# Patient Record
Sex: Male | Born: 1980 | Race: Black or African American | Hispanic: No | Marital: Married | Smoking: Current every day smoker
Health system: Southern US, Community
[De-identification: ages and names within clinical notes are randomized; demographics above are authoritative.]

## PROBLEM LIST (undated history)

## (undated) DIAGNOSIS — Z5900 Homelessness unspecified: Secondary | ICD-10-CM

## (undated) DIAGNOSIS — F1010 Alcohol abuse, uncomplicated: Secondary | ICD-10-CM

---
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
## 2002-10-30 ENCOUNTER — Emergency Department (HOSPITAL_COMMUNITY): Admission: EM | Admit: 2002-10-30 | Discharge: 2002-10-30 | Payer: Self-pay | Admitting: *Deleted

## 2002-11-03 ENCOUNTER — Emergency Department (HOSPITAL_COMMUNITY): Admission: EM | Admit: 2002-11-03 | Discharge: 2002-11-03 | Payer: Self-pay | Admitting: Emergency Medicine

## 2002-11-04 ENCOUNTER — Emergency Department (HOSPITAL_COMMUNITY): Admission: EM | Admit: 2002-11-04 | Discharge: 2002-11-05 | Payer: Self-pay | Admitting: Emergency Medicine

## 2005-05-02 ENCOUNTER — Emergency Department (HOSPITAL_COMMUNITY): Admission: EM | Admit: 2005-05-02 | Discharge: 2005-05-02 | Payer: Self-pay | Admitting: Emergency Medicine

## 2005-09-19 ENCOUNTER — Emergency Department (HOSPITAL_COMMUNITY): Admission: EM | Admit: 2005-09-19 | Discharge: 2005-09-19 | Payer: Self-pay | Admitting: Emergency Medicine

## 2008-10-06 ENCOUNTER — Emergency Department (HOSPITAL_COMMUNITY): Admission: EM | Admit: 2008-10-06 | Discharge: 2008-10-06 | Payer: Self-pay | Admitting: Emergency Medicine

## 2008-11-06 ENCOUNTER — Emergency Department (HOSPITAL_COMMUNITY): Admission: EM | Admit: 2008-11-06 | Discharge: 2008-11-06 | Payer: Self-pay | Admitting: Emergency Medicine

## 2008-11-08 ENCOUNTER — Emergency Department (HOSPITAL_COMMUNITY): Admission: EM | Admit: 2008-11-08 | Discharge: 2008-11-08 | Payer: Self-pay | Admitting: Emergency Medicine

## 2010-08-08 ENCOUNTER — Emergency Department (HOSPITAL_COMMUNITY): Payer: Self-pay

## 2010-08-08 ENCOUNTER — Emergency Department (HOSPITAL_COMMUNITY)
Admission: EM | Admit: 2010-08-08 | Discharge: 2010-08-08 | Disposition: A | Discharge status: Home / Self Care | Payer: Self-pay | Attending: Emergency Medicine | Admitting: Emergency Medicine

## 2010-08-08 DIAGNOSIS — X58XXXA Exposure to other specified factors, initial encounter: Secondary | ICD-10-CM | POA: Insufficient documentation

## 2010-08-08 DIAGNOSIS — M25519 Pain in unspecified shoulder: Secondary | ICD-10-CM | POA: Insufficient documentation

## 2010-08-08 DIAGNOSIS — Y9367 Activity, basketball: Secondary | ICD-10-CM | POA: Insufficient documentation

## 2010-11-24 ENCOUNTER — Emergency Department (HOSPITAL_COMMUNITY): Payer: Self-pay

## 2010-11-24 ENCOUNTER — Emergency Department (HOSPITAL_COMMUNITY)
Admission: EM | Admit: 2010-11-24 | Discharge: 2010-11-24 | Disposition: A | Discharge status: Home / Self Care | Payer: Self-pay | Attending: Emergency Medicine | Admitting: Emergency Medicine

## 2010-11-24 DIAGNOSIS — W1809XA Striking against other object with subsequent fall, initial encounter: Secondary | ICD-10-CM | POA: Insufficient documentation

## 2010-11-24 DIAGNOSIS — S4350XA Sprain of unspecified acromioclavicular joint, initial encounter: Secondary | ICD-10-CM | POA: Insufficient documentation

## 2010-11-24 DIAGNOSIS — M25519 Pain in unspecified shoulder: Secondary | ICD-10-CM | POA: Insufficient documentation

## 2011-11-10 ENCOUNTER — Emergency Department (HOSPITAL_COMMUNITY): Payer: Self-pay

## 2011-11-10 ENCOUNTER — Emergency Department (HOSPITAL_COMMUNITY)
Admission: EM | Admit: 2011-11-10 | Discharge: 2011-11-10 | Disposition: A | Discharge status: Home / Self Care | Payer: Self-pay | Attending: Emergency Medicine | Admitting: Emergency Medicine

## 2011-11-10 ENCOUNTER — Encounter (HOSPITAL_COMMUNITY): Payer: Self-pay | Admitting: Adult Health

## 2011-11-10 DIAGNOSIS — S40012A Contusion of left shoulder, initial encounter: Secondary | ICD-10-CM

## 2011-11-10 DIAGNOSIS — S40019A Contusion of unspecified shoulder, initial encounter: Secondary | ICD-10-CM | POA: Insufficient documentation

## 2011-11-10 DIAGNOSIS — F17200 Nicotine dependence, unspecified, uncomplicated: Secondary | ICD-10-CM | POA: Insufficient documentation

## 2011-11-10 MED ORDER — KETOROLAC TROMETHAMINE 60 MG/2ML IM SOLN
60.0000 mg | Freq: Once | INTRAMUSCULAR | Status: AC
  Administered 2011-11-10: 60 mg via INTRAMUSCULAR
  Filled 2011-11-10: qty 2

## 2011-11-10 MED ORDER — TRAMADOL HCL 50 MG PO TABS: 50.0000 mg | ORAL_TABLET | Freq: Four times a day (QID) | ORAL | Status: AC | PRN
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
## 2011-11-10 NOTE — ED Notes (Signed)
Alert, NAD, calm, interactive, EDP in to see pt.

## 2012-07-18 ENCOUNTER — Emergency Department (HOSPITAL_COMMUNITY): Payer: Self-pay

## 2012-07-18 ENCOUNTER — Emergency Department (HOSPITAL_COMMUNITY)
Admission: EM | Admit: 2012-07-18 | Discharge: 2012-07-19 | Disposition: A | Discharge status: Home / Self Care | Payer: Self-pay | Attending: Emergency Medicine | Admitting: Emergency Medicine

## 2012-07-18 ENCOUNTER — Encounter (HOSPITAL_COMMUNITY): Payer: Self-pay | Admitting: Radiology

## 2012-07-18 DIAGNOSIS — F17200 Nicotine dependence, unspecified, uncomplicated: Secondary | ICD-10-CM | POA: Insufficient documentation

## 2012-07-18 DIAGNOSIS — F10929 Alcohol use, unspecified with intoxication, unspecified: Secondary | ICD-10-CM

## 2012-07-18 DIAGNOSIS — F1010 Alcohol abuse, uncomplicated: Secondary | ICD-10-CM | POA: Insufficient documentation

## 2012-07-18 LAB — CBC WITH DIFFERENTIAL/PLATELET
Basophils Absolute: 0 10*3/uL (ref 0.0–0.1)
Basophils Relative: 1 % (ref 0–1)
Eosinophils Absolute: 0 10*3/uL (ref 0.0–0.7)
Eosinophils Relative: 0 % (ref 0–5)
HCT: 41.7 % (ref 39.0–52.0)
Hemoglobin: 14.5 g/dL (ref 13.0–17.0)
Lymphocytes Relative: 41 % (ref 12–46)
Lymphs Abs: 1.5 10*3/uL (ref 0.7–4.0)
MCH: 31.9 pg (ref 26.0–34.0)
MCHC: 34.8 g/dL (ref 30.0–36.0)
MCV: 91.9 fL (ref 78.0–100.0)
Monocytes Absolute: 0.2 10*3/uL (ref 0.1–1.0)
Monocytes Relative: 6 % (ref 3–12)
Neutro Abs: 1.9 10*3/uL (ref 1.7–7.7)
Neutrophils Relative %: 52 % (ref 43–77)
Platelets: 240 10*3/uL (ref 150–400)
RBC: 4.54 MIL/uL (ref 4.22–5.81)
RDW: 14.2 % (ref 11.5–15.5)
WBC: 3.6 10*3/uL — ABNORMAL LOW (ref 4.0–10.5)

## 2012-07-18 LAB — BASIC METABOLIC PANEL
BUN: 9 mg/dL (ref 6–23)
CO2: 25 mEq/L (ref 19–32)
Calcium: 8.6 mg/dL (ref 8.4–10.5)
Chloride: 106 mEq/L (ref 96–112)
Creatinine, Ser: 0.93 mg/dL (ref 0.50–1.35)
GFR calc Af Amer: >90 mL/min (ref >90)
GFR calc non Af Amer: >90 mL/min (ref >90)
Glucose, Bld: 85 mg/dL (ref 70–99)
Potassium: 4.6 mEq/L (ref 3.5–5.1)
Sodium: 143 mEq/L (ref 135–145)

## 2012-07-18 LAB — RAPID URINE DRUG SCREEN, HOSP PERFORMED
Amphetamines: NOT DETECTED
Barbiturates: NOT DETECTED
Benzodiazepines: NOT DETECTED
Cocaine: NOT DETECTED
Opiates: NOT DETECTED
Tetrahydrocannabinol: NOT DETECTED

## 2012-07-18 LAB — ETHANOL: Alcohol, Ethyl (B): 404 mg/dL (ref 0–11)

## 2012-07-18 MED ORDER — SODIUM CHLORIDE 0.9 % IV BOLUS (SEPSIS)
1000.0000 mL | Freq: Once | INTRAVENOUS | Status: AC
  Administered 2012-07-18: 1000 mL via INTRAVENOUS
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
## 2012-07-18 NOTE — ED Notes (Signed)
Pt continues to rest without distress.

## 2012-07-18 NOTE — ED Notes (Signed)
Back from CT, no changes, alert, NAD, calm, interactive.  
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
## 2012-07-18 NOTE — ED Notes (Signed)
Patient transported to CT 
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
## 2012-07-19 ENCOUNTER — Encounter (HOSPITAL_COMMUNITY): Payer: Self-pay | Admitting: Emergency Medicine

## 2012-07-19 MED ORDER — SODIUM CHLORIDE 0.9 % IV BOLUS (SEPSIS)
1000.0000 mL | Freq: Once | INTRAVENOUS | Status: AC
  Administered 2012-07-19: 1000 mL via INTRAVENOUS

## 2012-07-19 NOTE — ED Notes (Signed)
Arousable, alert, NAD, calm, IVF bolus infusing, VSS.
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
## 2012-07-19 NOTE — ED Notes (Signed)
Pt sleeping, NAD, calm, arousable, interactive.

## 2012-07-19 NOTE — ED Notes (Signed)
arousable, interactive, alert, NAD, calm, speaking in clear sentences, mentions generalized pain "all over" and nausea, denies other sx. VSS.

## 2012-07-19 NOTE — ED Notes (Addendum)
VSS, BP low, sleeping, arousable, NAD, calm, interactive, 3rd liter NS IVF hung KVO.

## 2012-07-28 ENCOUNTER — Emergency Department (HOSPITAL_COMMUNITY): Payer: Self-pay

## 2012-07-28 ENCOUNTER — Emergency Department (HOSPITAL_COMMUNITY)
Admission: EM | Admit: 2012-07-28 | Discharge: 2012-07-28 | Disposition: A | Discharge status: Home / Self Care | Payer: Self-pay | Attending: Emergency Medicine | Admitting: Emergency Medicine

## 2012-07-28 ENCOUNTER — Encounter (HOSPITAL_COMMUNITY): Payer: Self-pay | Admitting: Emergency Medicine

## 2012-07-28 DIAGNOSIS — S01501A Unspecified open wound of lip, initial encounter: Secondary | ICD-10-CM | POA: Insufficient documentation

## 2012-07-28 DIAGNOSIS — IMO0002 Reserved for concepts with insufficient information to code with codable children: Secondary | ICD-10-CM | POA: Insufficient documentation

## 2012-07-28 DIAGNOSIS — T7411XA Adult physical abuse, confirmed, initial encounter: Secondary | ICD-10-CM | POA: Insufficient documentation

## 2012-07-28 DIAGNOSIS — S0990XA Unspecified injury of head, initial encounter: Secondary | ICD-10-CM | POA: Insufficient documentation

## 2012-07-28 DIAGNOSIS — S01511A Laceration without foreign body of lip, initial encounter: Secondary | ICD-10-CM

## 2012-07-28 DIAGNOSIS — S0993XA Unspecified injury of face, initial encounter: Secondary | ICD-10-CM | POA: Insufficient documentation

## 2012-07-28 DIAGNOSIS — F17200 Nicotine dependence, unspecified, uncomplicated: Secondary | ICD-10-CM | POA: Insufficient documentation

## 2012-07-28 DIAGNOSIS — S199XXA Unspecified injury of neck, initial encounter: Secondary | ICD-10-CM | POA: Insufficient documentation

## 2012-07-28 DIAGNOSIS — Z23 Encounter for immunization: Secondary | ICD-10-CM | POA: Insufficient documentation

## 2012-07-28 MED ORDER — TETANUS-DIPHTH-ACELL PERTUSSIS 5-2.5-18.5 LF-MCG/0.5 IM SUSP
0.5000 mL | Freq: Once | INTRAMUSCULAR | Status: AC
  Administered 2012-07-28: 0.5 mL via INTRAMUSCULAR
  Filled 2012-07-28: qty 0.5

## 2012-07-28 MED ORDER — TRAMADOL HCL 50 MG PO TABS
50.0000 mg | ORAL_TABLET | Freq: Once | ORAL | Status: AC
  Administered 2012-07-28: 50 mg via ORAL
  Filled 2012-07-28: qty 1

## 2012-07-28 MED ORDER — ONDANSETRON 4 MG PO TBDP
8.0000 mg | ORAL_TABLET | Freq: Once | ORAL | Status: AC
  Administered 2012-07-28: 8 mg via ORAL
  Filled 2012-07-28: qty 2

## 2012-07-28 MED ORDER — OXYCODONE-ACETAMINOPHEN 5-325 MG PO TABS
2.0000 | ORAL_TABLET | Freq: Once | ORAL | Status: AC
  Administered 2012-07-28: 2 via ORAL
  Filled 2012-07-28: qty 2

## 2012-07-28 MED ORDER — PENICILLIN V POTASSIUM 500 MG PO TABS: 500.0000 mg | ORAL_TABLET | Freq: Four times a day (QID) | ORAL | Status: AC

## 2012-07-28 MED ORDER — HYDROCODONE-ACETAMINOPHEN 5-325 MG PO TABS: 2.0000 | ORAL_TABLET | Freq: Once | ORAL | Status: DC

## 2012-07-28 MED ORDER — HYDROCODONE-ACETAMINOPHEN 5-325 MG PO TABS: 2.0000 | ORAL_TABLET | ORAL | Status: DC | PRN

## 2012-07-28 MED ORDER — ONDANSETRON 4 MG PO TBDP: 8.0000 mg | ORAL_TABLET | Freq: Once | ORAL | Status: DC
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
## 2012-07-28 NOTE — ED Notes (Signed)
Dahlia Client, PA at bedside suturing laceration on the inside of lower lip.

## 2012-07-28 NOTE — ED Notes (Signed)
Returned from xray

## 2012-07-28 NOTE — ED Notes (Signed)
Patient transported to X-ray 

## 2012-07-28 NOTE — ED Notes (Signed)
Jake Silva, [place] Updated that pt received doses of meds placed at 2026

## 2012-07-30 NOTE — ED Provider Notes (Signed)
  Medical screening examination/treatment/procedure(s) were performed by non-physician practitioner and as supervising physician I was immediately available for consultation/collaboration.    Gerhard Munch, MD 07/30/12 [phone]

## 2013-07-21 ENCOUNTER — Emergency Department (HOSPITAL_COMMUNITY): Payer: Self-pay

## 2013-07-21 ENCOUNTER — Emergency Department (HOSPITAL_COMMUNITY)
Admission: EM | Admit: 2013-07-21 | Discharge: 2013-07-22 | Disposition: A | Discharge status: Home / Self Care | Payer: Self-pay | Attending: Emergency Medicine | Admitting: Emergency Medicine

## 2013-07-21 DIAGNOSIS — R55 Syncope and collapse: Secondary | ICD-10-CM | POA: Insufficient documentation

## 2013-07-21 DIAGNOSIS — F10929 Alcohol use, unspecified with intoxication, unspecified: Secondary | ICD-10-CM

## 2013-07-21 DIAGNOSIS — F17200 Nicotine dependence, unspecified, uncomplicated: Secondary | ICD-10-CM | POA: Insufficient documentation

## 2013-07-21 DIAGNOSIS — F1010 Alcohol abuse, uncomplicated: Secondary | ICD-10-CM | POA: Insufficient documentation

## 2013-07-21 LAB — CBG MONITORING, ED: GLUCOSE-CAPILLARY: 84 mg/dL (ref 70–99)

## 2013-07-21 LAB — ETHANOL: ALCOHOL ETHYL (B): 386 mg/dL — AB (ref 0–11)

## 2013-07-21 NOTE — ED Notes (Signed)
Pt cleared off spine board by Dr Gwendolyn GrantWalden
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
## 2013-07-21 NOTE — ED Notes (Signed)
Bed: [id number] Expected date:  Expected time:  Means of arrival:  Comments: EMS 38M ETOH/Syncope

## 2013-07-22 NOTE — ED Notes (Signed)
Comfort measures given,

## 2013-07-22 NOTE — ED Notes (Signed)
Pt is sleeping,  NAD,  Vital signs within normal limit ,  Monitor on pt

## 2013-07-22 NOTE — ED Notes (Signed)
Pt had two sandwiches and two ginger ales,  Pt tolerated well

## 2013-07-22 NOTE — ED Notes (Signed)
Patient is alert and oriented x3.  He was given DC instructions and follow up visit instructions.  Patient gave verbal understanding.  He was DC ambulatory under his own power to home.  V/S stable.  He was not showing any signs of distress on DC 

## 2013-07-22 NOTE — ED Notes (Signed)
c-collar removed

## 2013-07-22 NOTE — Discharge Instructions (Signed)
°Emergency Department Resource Guide °1) Find a Doctor and Pay Out of Pocket °Although you won't have to find out who is covered by your insurance plan, it is a good idea to ask around and get recommendations. You will then need to call the office and see if the doctor you have chosen will accept you as a new patient and what types of options they offer for patients who are self-pay. Some doctors offer discounts or will set up payment plans for their patients who do not have insurance, but you will need to ask so you aren't surprised when you get to your appointment. ° °2) Contact Your Local Health Department °Not all health departments have doctors that can see patients for sick visits, but many do, so it is worth a call to see if yours does. If you don't know where your local health department is, you can check in your phone book. The CDC also has a tool to help you locate your state's health department, and many state websites also have listings of all of their local health departments. ° °3) Find a Walk-in Clinic °If your illness is not likely to be very severe or complicated, you may want to try a walk in clinic. These are popping up all over the country in pharmacies, drugstores, and shopping centers. They're usually staffed by nurse practitioners or physician assistants that have been trained to treat common illnesses and complaints. They're usually fairly quick and inexpensive. However, if you have serious medical issues or chronic medical problems, these are probably not your best option. ° °No Primary Care Doctor: °- Call [facility] at  832-8000 - they can help you locate a primary care doctor that  accepts your insurance, provides certain services, etc. °- Physician Referral Service- 1-800-533-3463 ° °Chronic Pain Problems: °Organization         Address  Phone   Notes  °[facility]  (336) [phone] Patients need to be referred by their primary care doctor.  ° °Medication  Assistance: °Organization         Address  Phone   Notes  °[place] [facility] [address] °[place], [place] [address] (336) 641-8030 --Must be a resident of [place] °-- Must have NO insurance coverage whatsoever (no Medicaid/ Medicare, etc.) °-- The pt. MUST have a primary care doctor that directs their care regularly and follows them in the community °  °[facility]  (866) 331-1348   °[employer]  (888) 892-1162   ° °Agencies that provide inexpensive medical care: °Organization         Address  Phone   Notes  °[facility]  (336) 832-8035   °[facility]    (336) 832-7272   °[facility] [address] °[place], [place] [address] (336) 832-4777   °[facility] [address], °[place] (336) 271-4999   °[facility]    (336) 373-0678   °[facility]    (336) 272-1050   °[facility] ° [address], [place] Phone:  (336) 832-4444, Fax:  (336) 832-4440 Hours of Operation:  9 am - 6 pm, M-F.  Also accepts Medicaid/Medicare and self-pay.  °[facility] ° [address], [place] Phone: (336) [phone], Fax: (336) [phone]. Hours of Operation:  8:30 am - 5:30 pm, M-F.  Also accepts Medicaid and self-pay.  °[facility] [place] [address], [place] Phone: (336) 878-6027   °[facility] [address], [place], [place] (336)723-1848, Ext. 123 Mondays & Thursdays: 7-9 AM.  First 15 patients are seen on a first come, first serve basis. °  ° °Medicaid-accepting [place] Providers: ° °Organization         Address  Phone   Notes  °[facility] [address], [place] (336) 641-2100 Also accepts self-pay patients.  °[facility] [address], [place] ° (336) 856-9996   °[facility] [address], [place]  (336) 288-8857   °[facility] [address] [place] [address], [place] (336) 299-7000   °Veita Bland [address], [place]  ° (336) 373-1557 Only accepts [place] Access Medicaid patients after they have their name applied to their card.  ° °Self-Pay (no insurance) in [place]: ° °Organization         Address  Phone   Notes  °Sickle Cell Patients, [facility] [address], [place] (336) 832-1970   °[facility] [address], [place] (336) 832-4400   °[facility]  ° [address],  (336) 992-4800   °[facility]/Dr. Osei-Bonsu ° [address] [place] [address], [place] or [address], [place] (336) 841-8500 Phone number for both [place] and [place] locations is the same.  °[facility] [address], [place] (336) 299-0000   °[facility] [address] [place] [address], [place] or [address] (336) 852-7530 °(336) 878-2260   °[facility] [address], [place] (336) 350-1642, phone; (336) 294-5005, fax Sees patients 1st and 3rd Saturday of every month.  Must not qualify for public or private insurance (i.e. Medicaid, Medicare, [place] Health Choice, Veterans' Benefits) • Household income should be no more than 200% of the poverty level •The clinic cannot treat you if you are pregnant or think you are pregnant • Sexually transmitted diseases are not treated at the clinic.  ° ° °Dental Care: °Organization         Address  Phone  Notes  °[place] [facility] [address], [place] (336) 641-6152 Accepts children up to age 21 who are enrolled in Medicaid or [place] Health Choice; pregnant women with a Medicaid card; and children who have applied for Medicaid or [place] Health Choice, but were declined, whose parents can pay a reduced fee at time of service.  °[place]  [facility] [place]  [address], [place] (336) 641-7733 Accepts children up to age 21 who are enrolled in Medicaid or [place] Health Choice; pregnant women with a Medicaid card; and children who have applied for Medicaid or [place] Health Choice, but were declined, whose parents can pay a reduced fee at time of service.  °[facility] ° [address], [place] (336) 641-4533 Patients are seen by appointment only. Walk-ins are not accepted. [facility] will see patients 18 years of age and older. °Monday - Tuesday (8am-5pm) °Most Wednesdays (8:30-5pm) °$30 per visit, cash only  °[facility] ° [address], [place] (336) 641-4533 Patients are seen by appointment only. Walk-ins are not accepted. [facility] will see patients 18 years of age and older. °One   Wednesday Evening (Monthly: Volunteer Based).  $30 per visit, cash only  °[facility]  (919) 537-3737 for adults; Children under age 4, call Graduate Pediatric Dentistry at ([phone]) [phone]. Children aged 4-14, please call ([phone]) [phone] to request a pediatric application. ° Dental services are provided in all areas of dental care including fillings, crowns and bridges, complete and partial dentures, implants, gum treatment, root canals, and extractions. Preventive care is also provided. Treatment is provided to both adults and children. °Patients are selected via a lottery and there is often a waiting list. °  °[facility] [address], °[place] ° (336) 763-8833 [email or web address] °  °[facility] [address], [place], [place] (336)723-1848, Ext. 123 Second and Fourth Thursday of each month, opens at 6:30 AM; Clinic ends at 9 AM.  Patients are seen on a first-come first-served basis, and a limited number are seen during each clinic.  ° °[facility] ° [address], [place], [place] (336) 723-7904    Eligibility Requirements °You must have lived in [place], or [place] counties for at least the last three months. °  You cannot be eligible for state or federal sponsored healthcare insurance, including Veterans Administration, Medicaid, or Medicare. °  You generally cannot be eligible for healthcare insurance through your employer.  °  How to apply: °Eligibility screenings are held every Tuesday and Wednesday afternoon from 1:00 pm until 4:00 pm. You do not need an appointment for the interview!  °[facility] [address], [place], [place] 336-631-2330   °[facility]  336-342-8273   °[facility]  336-703-3100   °[facility]  336-570-6415   ° °Behavioral Health Resources in the Community: °Intensive Outpatient Programs °Organization         Address  Phone  Notes  °[facility] [address], [place], [place] 336-878-6098   °[facility] [address], [place], [place] 336-832-9800   °[facility] [address], [place], [place] ° 336-882-2125   °[facility] [address],  °[place], [place] 1-800-853-5163 or 336-641-4981   °Substance Abuse Resources °Organization         Address  Phone  Notes  °[facility]  336-882-2125   °[facility]  336-784-9470   °[facility]  336-285-9073   °[facility]  336-845-3988   °Residential & Outpatient Substance Abuse Program  1-800-659-3381   °Psychological Services °Organization         Address  Phone  Notes  °[facility]  336- 832-9600   °[facility]  336- 378-7881   °[facility] [address], [place] 1-800-853-5163 or 336-641-4981   ° °Mobile Crisis Teams °Organization         Address  Phone  Notes  °[facility], Mobile Crisis Care Unit  1-877-626-1772   °Assertive °[facility] ° [address]  [place], [place] 336-834-9664   °Sharon DeEsch [address] °[place] 336-554-5454   ° °Self-Help/Support Groups °Organization         Address  Phone             Notes  °[facility] [facility] - variety of support groups  336- 373-1402 Call for more information  °Narcotics Anonymous (NA), [facility] [address], °[place]  2 meetings at this location  ° °  Residential Treatment Programs °Organization         Address  Phone  Notes  °[facility] [address],    °[place]  1-866-801-8205   °[facility] ° [address], [place], [place] 704-293-8524   °[facility] [address], [place] 336-845-3988 Admissions: 8am-3pm M-F  °[facility] [address]    °[place], [place] 336-841-1104   °[facility] [address], [place], [place] 336-379-7146   °[facility] [address]  °[place], [place] 336-285-9073   °[facility] - Intensive Outpatient [address], [place], [place] 336-852-3033   °[facility] [address]  °[place], [place] 1-877-615-2722 or 336-784-9470   °[facility] [address] [place], [place] 336-227-7417 Accepts Medicaid  °[facility] [address]  ° [place] 1-800-659-3381 Substance Abuse/Addiction Treatment  ° °[place] Behavioral Health Resources °Organization         Address  Phone  Notes  °[facility]  (888) 581-9988   °Julie Brannon, PhD [address] [place], [place]   (336) 349-5553 or (336) 951-0000   °[facility]   [address] °[place], [place] (336) 349-4454   °[facility] [address], [place], [place] (336) 342-8316 Insurance/Medicaid/sponsorship through [facility]  °[facility] [address]                                    [place], [place] (336) 342-8316 Therapy/tele-psych/case    °[facility] [address]  ° [place], [place] (336) 349-2233    °Dr. Arfeen  (336) 349-4544   °[facility] [place]  [employer] [place] [facility] 1) [address], [place] °2) [address], [place] °3)  [address], [place] (336) 349-3220 °(336) 342-7768 ° °(336) 342-8140   °[place] [facility] (336) 342-1394 or (336) 342-3537 (After Hours)    ° ° °

## 2013-07-23 ENCOUNTER — Emergency Department (HOSPITAL_COMMUNITY)
Admission: EM | Admit: 2013-07-23 | Discharge: 2013-07-24 | Disposition: A | Discharge status: Home / Self Care | Payer: Self-pay | Attending: Emergency Medicine | Admitting: Emergency Medicine

## 2013-07-23 ENCOUNTER — Encounter (HOSPITAL_COMMUNITY): Payer: Self-pay | Admitting: Emergency Medicine

## 2013-07-23 ENCOUNTER — Emergency Department (HOSPITAL_COMMUNITY): Payer: Self-pay

## 2013-07-23 DIAGNOSIS — X58XXXA Exposure to other specified factors, initial encounter: Secondary | ICD-10-CM | POA: Insufficient documentation

## 2013-07-23 DIAGNOSIS — G8929 Other chronic pain: Secondary | ICD-10-CM | POA: Insufficient documentation

## 2013-07-23 DIAGNOSIS — F1010 Alcohol abuse, uncomplicated: Secondary | ICD-10-CM | POA: Insufficient documentation

## 2013-07-23 DIAGNOSIS — Y9389 Activity, other specified: Secondary | ICD-10-CM | POA: Insufficient documentation

## 2013-07-23 DIAGNOSIS — M79609 Pain in unspecified limb: Secondary | ICD-10-CM | POA: Insufficient documentation

## 2013-07-23 DIAGNOSIS — S40012A Contusion of left shoulder, initial encounter: Secondary | ICD-10-CM

## 2013-07-23 DIAGNOSIS — F10929 Alcohol use, unspecified with intoxication, unspecified: Secondary | ICD-10-CM

## 2013-07-23 DIAGNOSIS — F17200 Nicotine dependence, unspecified, uncomplicated: Secondary | ICD-10-CM | POA: Insufficient documentation

## 2013-07-23 DIAGNOSIS — S40019A Contusion of unspecified shoulder, initial encounter: Secondary | ICD-10-CM | POA: Insufficient documentation

## 2013-07-23 DIAGNOSIS — Y929 Unspecified place or not applicable: Secondary | ICD-10-CM | POA: Insufficient documentation

## 2013-07-23 NOTE — ED Notes (Signed)
Dr Miller at Bedside.

## 2013-07-23 NOTE — ED Notes (Signed)
Per EMS, pt found on on ground on [address] of [address] unresponsive.  Pt was responsive to sternal rub and awoke and has been asking for food since.  Pt states he has left arm and leg pain, and states this has happened in the past when he has drank to much.  Pt states he can not move his left leg at this time

## 2013-07-24 LAB — I-STAT CHEM 8, ED
BUN: 8 mg/dL (ref 6–23)
CALCIUM ION: 1.14 mmol/L (ref 1.12–1.23)
Chloride: 106 mEq/L (ref 96–112)
Creatinine, Ser: 1.3 mg/dL (ref 0.50–1.35)
Glucose, Bld: 86 mg/dL (ref 70–99)
HEMATOCRIT: 45 % (ref 39.0–52.0)
Hemoglobin: 15.3 g/dL (ref 13.0–17.0)
Potassium: 3.7 mEq/L (ref 3.7–5.3)
Sodium: 144 mEq/L (ref 137–147)
TCO2: 25 mmol/L (ref 0–100)

## 2013-07-24 LAB — ETHANOL: Alcohol, Ethyl (B): 344 mg/dL — ABNORMAL HIGH (ref 0–11)

## 2013-07-24 NOTE — Discharge Instructions (Signed)
Alcohol Intoxication Alcohol intoxication occurs when the amount of alcohol that a person has consumed impairs his or her ability to mentally and physically function. Alcohol directly impairs the normal chemical activity of the brain. Drinking large amounts of alcohol can lead to changes in mental function and behavior, and it can cause many physical effects that can be harmful.  Alcohol intoxication can range in severity from mild to very severe. Various factors can affect the level of intoxication that occurs, such as the person's age, gender, weight, frequency of alcohol consumption, and the presence of other medical conditions (such as diabetes, seizures, or heart conditions). Dangerous levels of alcohol intoxication may occur when people drink large amounts of alcohol in a short period (binge drinking). Alcohol can also be especially dangerous when combined with certain prescription medicines or "recreational" drugs. SIGNS AND SYMPTOMS Some common signs and symptoms of mild alcohol intoxication include:  Loss of coordination.  Changes in mood and behavior.  Impaired judgment.  Slurred speech. As alcohol intoxication progresses to more severe levels, other signs and symptoms will appear. These may include:  Vomiting.  Confusion and impaired memory.  Slowed breathing.  Seizures.  Loss of consciousness. DIAGNOSIS  Your health care provider will take a medical history and perform a physical exam. You will be asked about the amount and type of alcohol you have consumed. Blood tests will be done to measure the concentration of alcohol in your blood. In many places, your blood alcohol level must be lower than 80 mg/dL (0.08%) to legally drive. However, many dangerous effects of alcohol can occur at much lower levels.  TREATMENT  People with alcohol intoxication often do not require treatment. Most of the effects of alcohol intoxication are temporary, and they go away as the alcohol naturally  leaves the body. Your health care provider will monitor your condition until you are stable enough to go home. Fluids are sometimes given through an IV access tube to help prevent dehydration.  HOME CARE INSTRUCTIONS  Do not drive after drinking alcohol.  Stay hydrated. Drink enough water and fluids to keep your urine clear or pale yellow. Avoid caffeine.   Only take over-the-counter or prescription medicines as directed by your health care provider.  SEEK MEDICAL CARE IF:   You have persistent vomiting.   You do not feel better after a few days.  You have frequent alcohol intoxication. Your health care provider can help determine if you should see a substance use treatment counselor. SEEK IMMEDIATE MEDICAL CARE IF:   You become shaky or tremble when you try to stop drinking.   You shake uncontrollably (seizure).   You throw up (vomit) blood. This may be bright red or may look like black coffee grounds.   You have blood in your stool. This may be bright red or may appear as a black, tarry, bad smelling stool.   You become lightheaded or faint.  MAKE SURE YOU:   Understand these instructions.  Will watch your condition.  Will get help right away if you are not doing well or get worse. Document Released: 12/10/2004 Document Revised: 11/02/2012 Document Reviewed: 08/05/2012 [facility] Patient Information 2014 [place].   Emergency Department Resource Guide 1) Find a Doctor and Pay Out of Pocket Although you won't have to find out who is covered by your insurance plan, it is a good idea to ask around and get recommendations. You will then need to call the office and see if the doctor you have chosen  will accept you as a new patient and what types of options they offer for patients who are self-pay. Some doctors offer discounts or will set up payment plans for their patients who do not have insurance, but you will need to ask so you aren't surprised when you get to your  appointment.  2) Contact Your Local Health Department Not all health departments have doctors that can see patients for sick visits, but many do, so it is worth a call to see if yours does. If you don't know where your local health department is, you can check in your phone book. The CDC also has a tool to help you locate your state's health department, and many state websites also have listings of all of their local health departments.  3) Find a [facility] If your illness is not likely to be very severe or complicated, you may want to try a walk in clinic. These are popping up all over the country in pharmacies, drugstores, and shopping centers. They're usually staffed by nurse practitioners or physician assistants that have been trained to treat common illnesses and complaints. They're usually fairly quick and inexpensive. However, if you have serious medical issues or chronic medical problems, these are probably not your best option.  No Primary Care Doctor: - Call Health Connect at  [phone] - they can help you locate a primary care doctor that  accepts your insurance, provides certain services, etc. - Physician Referral Service- [phone]  Chronic Pain Problems: Organization         Address  Phone   Notes  [facility]  [phone] Patients need to be referred by their primary care doctor.   Medication Assistance: Organization         Address  Phone   Notes  [facility] Medication [facility] [address] [place], [place] [address] [phone] --Must be a resident of [facility] -- Must have NO insurance coverage whatsoever (no Medicaid/ Medicare, etc.) -- The pt. MUST have a primary care doctor that directs their care regularly and follows them in the community   [facility]ist  [phone]   [employer]  [phone]    Agencies that provide inexpensive medical care: Organization         Address  Phone   Notes  [place]  [phone]   [facility]    [phone]   [facility] [place],  [address] [phone]   [place] [address], [place] [phone]   Planned Parenthood    [phone]   [facility]    [phone]   [place] and [place] [address], [place] Phone:  [phone], Fax:  [phone] Hours of Operation:  9 am - 6 pm, M-F.  Also accepts Medicaid/Medicare and self-pay.  [facility] for [place], [address], [place] Phone: [phone], Fax: [phone]. Hours of Operation:  8:30 am - 5:30 pm, M-F.  Also accepts Medicaid and self-pay.  [facility] [place] [address], [place] Phone: [phone]   [place], [place], [place] [phone], Ext. [phone] Mondays & Thursdays: 7-9 AM.  First 15 patients are seen on a first come, first serve basis.    [place] Providers:  Organization  Address  Phone   Notes  [facility] [address], [place] [phone] Also accepts self-pay patients.  [facility] [id number] [place], [place]  [phone]   [place], [address], [place] [phone]   [facility] [address], [place] [phone]   Lucianne Lei [address], [place]   [phone] Only accepts [place] Access [place] patients after they have their name applied to their card.   Self-Pay (no insurance) in [facility]:  Organization         Address  Phone   Notes  Sickle Cell Patients, [facility] [place] [phone]   [facility] [place] [phone]   [facility]  [place]  [place], [place], [place] [phone]   [facility]/Dr. Osei-Bonsu  [address] [place] or [address], [place] [phone] Phone number for both [place] and [place] locations is the same.  [facility] [address], [place] [phone]   [facility] [address], [place] or [address] [phone]   [facility] [address] [place] [phone], phone; [phone], fax Sees patients 1st and 3rd Saturday of every month.  Must not qualify for public or private insurance (i.e. Medicaid, Medicare, [place] Health Choice, Veterans' Benefits)  Household income should be no more than 200% of the poverty level The clinic cannot treat you if you are pregnant or think you are pregnant  Sexually transmitted diseases are not treated at the clinic.    Dental Care: Organization         Address  Phone  Notes  [facility] [place] [phone] Accepts children up to age 49 who are enrolled in [place] or [place]; pregnant women with a Medicaid card; and children who have applied for Medicaid or [place] Health Choice, but were declined, whose parents can pay a reduced fee at time of service.  [facility] Department of [facility]  [address], [place] [phone] Accepts children up to age 71 who are enrolled in [place] or [place]; pregnant women with a Medicaid card; and children who have applied for Medicaid or [place] Health Choice, but were declined, whose parents can pay a reduced fee at time of service.  [place] [facility]  [place] [phone] Patients are seen by appointment only. Walk-ins are not accepted. [place] will see patients 31 years of age and  older. Monday - Tuesday (8am-5pm) Most Wednesdays (8:30-5pm) $30 per visit, cash only  [facility]  [address], [facility] [phone] Patients are seen by appointment only. Walk-ins are not accepted. [place] will see patients 8 years of age and older. One Wednesday Evening (Monthly: Volunteer Based).  $30 per visit, cash only  [place]  [phone] for adults; Children under age 39, call Graduate Pediatric Dentistry at [phone]. Children aged 82-14, please call [phone] to request a pediatric application.  Dental services are provided in all areas of dental care including  fillings, crowns and bridges, complete and partial dentures, implants, gum treatment, root canals, and extractions. Preventive care is also provided. Treatment is provided to both adults and children. Patients are selected via a lottery and there is often a waiting list.   [facility] [address] [place]  [phone] [email or web address]   [facility] [address] [place], [place] [phone], Ext. [phone] Second and Fourth Thursday of each month, opens at 6:30 AM; Clinic ends at 9 AM.  Patients are seen on a first-come first-served basis, and a limited number are seen during each clinic.   [facility]  [address] [place] [place], [place] [phone]   Eligibility Requirements You must have lived in [place], [place], or [place] counties for at least the last three months.   You cannot be eligible for state or federal sponsored [employer], including [employer], [place], or [employer].   You generally cannot be eligible for healthcare insurance through your employer.    How to apply: Eligibility screenings are held every Tuesday and Wednesday afternoon from 1:00 pm until 4:00 pm. You do not need an appointment for the interview!  [facility] [address]  [place], [place]   [place]  [place] Department  [place]  [phone]    Behavioral Health Resources in the Community: Intensive Outpatient Programs Organization         Address  Phone  Notes  [place]. [address] [place], [place] [phone]   [facility] [address] [place], [place]   [facility]: [facility] [address] [place], [place]   [place] [address]  [place], [place] or [phone]   Substance Abuse Resources Organization         Address  Phone  Notes  Alcohol and Drug Services  [phone]   [place]  [phone]   [facility] [place]   Chinita Pester  [phone]   Residential & Outpatient Substance Abuse Program  [phone]   Psychological Services Organization         Address  Phone  Notes  [facility] [place]  [place]  [phone]   [place] [address] [place] or [phone]    Mobile Crisis Teams Organization         Address  Phone  Notes  [facility], Mobile Crisis Care Unit  [phone]   [facility]  [address] [place], [place]   [place] [address] [place] [phone]    Self-Help/Support Groups Organization         Address  Phone             Notes  [place]. of [place] - variety of support groups  [facility] [place] Call for more information  Narcotics Anonymous (NA), [facility] [address], [employer] [place]  2 meetings at this location   Special educational needs teacher         Address  Phone  Notes  [facility] [place],    [place]  [place]  [address], [place] [phone], [place], [place]   [place],  [place] [phone] Admissions: 8am-3pm M-F  °[facility] [address]    °[place], [place] [phone]   °[facility] [address], [place], [place] [phone]   °[facility] [address]  °[place], [place] [phone]   °[facility] - Intensive Outpatient [address], [place], [place] [phone]   °[facility] [address]  °[place], [place] [phone] or [phone]   °[facility] ([facility]) [address] [place], [place] [phone] Accepts Medicaid  °[facility] [address]  °[place] 1-800-659-3381 Substance Abuse/Addiction Treatment  ° °[place] Behavioral Health Resources °Organization         Address  Phone  Notes  °[facility]  ([phone]) [phone]   °Julie Brannon, PhD [address] [place], [place]   ([phone]) [phone] or (336) 951-0000   °[facility]   [address] °[place], [place] ([phone]) [phone]   °[facility] [address], [place], [place] ([phone]) [phone] Insurance/Medicaid/sponsorship through [facility]  °[facility] [address]                                    [place], [place] [phone] Therapy/tele-psych/case  °[facility] [address]  ° [place], [place] ([phone]) [phone]    °Dr. Arfeen  ([phone]) [phone]   °[facility] [place]  [employer] [place] [facility] 1) [address], [place] °2) [address], [place] °3)  [address], [place] ([phone]) [phone] °([phone]) [phone] ° °(336) [phone]   °[place] Child Abuse Hotline (336) 342-1394 or (336) [phone] (After Hours)    ° ° ° °

## 2013-07-24 NOTE — ED Notes (Signed)
Removed hard collar per dr. Preston FleetingGlick.

## 2013-07-24 NOTE — ED Notes (Signed)
Pt stable while walking around Pod C. (post-breakfast) Nurse informed.

## 2013-07-24 NOTE — ED Provider Notes (Signed)
CSN: [id number]     Arrival date & time 07/23/13  2235 History   First MD Initiated Contact with Patient 07/23/13 2355     Chief Complaint  Patient presents with  . Shoulder Pain  . Leg Pain     (Consider location/radiation/quality/duration/timing/severity/associated sxs/prior Treatment) HPI Comments: The patient is a 33 year old male who was found to be alcohol intoxicated on the order of 2 streets in the city, he was initially unresponsive but came around to a sternal rub at which time he awoke and has been asking to eat. He complained of left arm pain and left leg pain, he states that this always happens when he drinks too much. On initial arrival the patient complained that he could not move his leg. He was placed on a backboard in a cervical collar and transported to the hospital. The patient denies any trauma or assaults.  Patient is a 33 y.o. male presenting with shoulder pain and leg pain. The history is provided by the patient and the EMS personnel.  Shoulder Pain  Leg Pain   History reviewed. No pertinent past medical history. History reviewed. No pertinent past surgical history. No family history on file. History  Substance Use Topics  . Smoking status: Current Every Day Smoker    Types: Cigarettes  . Smokeless tobacco: Not on file  . Alcohol Use: Yes    Review of Systems  All other systems reviewed and are negative.     Allergies  Review of patient's allergies indicates no known allergies.  Home Medications   Prior to Admission medications   Not on File   BP 115/68  Pulse 81  Temp(Src) 98 F (36.7 C) (Oral)  Resp 12  SpO2 94% Physical Exam  Nursing note and vitals reviewed. Constitutional: He appears well-developed and well-nourished. No distress.  HENT:  Head: Normocephalic and atraumatic.  Mouth/Throat: Oropharynx is clear and moist. No oropharyngeal exudate.  Eyes: Conjunctivae and EOM are normal. Pupils are equal, round, and reactive to light.  Right eye exhibits no discharge. Left eye exhibits no discharge. No scleral icterus.  Neck: Normal range of motion. Neck supple. No JVD present. No thyromegaly present.  Cardiovascular: Normal rate, regular rhythm, normal heart sounds and intact distal pulses.  Exam reveals no gallop and no friction rub.   No murmur heard. Pulmonary/Chest: Effort normal and breath sounds normal. No respiratory distress. He has no wheezes. He has no rales.  Abdominal: Soft. Bowel sounds are normal. He exhibits no distension and no mass. There is no tenderness.  Musculoskeletal: He exhibits tenderness ( Pain with range of motion of the left shoulder, no obvious deformities). He exhibits no edema.  All joints are supple, compartments are soft.  Lymphadenopathy:    He has no cervical adenopathy.  Neurological: He is alert. Coordination normal.  The patient is able to straight-leg raise bilaterally without any difficulty  Skin: Skin is warm and dry. No rash noted. No erythema.  Psychiatric: He has a normal mood and affect. His behavior is normal.    ED Course  Procedures (including critical care time) Labs Review Labs Reviewed  ETHANOL - Abnormal; Notable for the following:    Alcohol, Ethyl (B) 344 (*)    All other components within normal limits  I-STAT CHEM 8, ED    Imaging Review Dg Shoulder Left  07/23/2013   CLINICAL DATA:  Unresponsive, left shoulder pain.  EXAM: LEFT SHOULDER - 2+ VIEW  COMPARISON:  DG SHOULDER*L* dated 11/10/2011  FINDINGS: Remote  clavicle head fracture with mildly widened acromioclavicular joint space, similar. Subacromial and glenohumeral joint space intact. No destructive bony lesions. Periarticular soft tissue planes are nonsuspicious.  IMPRESSION: Remote AC joint separation and clavicle fracture without acute fracture deformity nor dislocation.   Electronically Signed   By: Courtnay  Bloomer   On: 07/23/2013 23:56Awilda Metro     MDM   Final diagnoses:  Contusion of left shoulder   Alcohol intoxication    The patient's mental status is alert at this time. He will be sent for an x-ray of his left shoulder to rule out fracture though the note from prior emergency department visits shows that he has chronic pain in the shoulder and with no signs of deformity I doubt fracture.  I personally view the x-rays of the left shoulder and vitamin B no signs of fracture and no changes since last x-rays. I have reevaluated the patient and he is now more somnolent than initially found. He will need an alcohol level and a chemistry, he does not have any signs of head trauma. He does arouse to a sternal rub and moves all extremities when that happens. I am able to move all joints through full range of motion when the patient is resting without any problems.  Change of shift - care signed out to Dr. Preston FleetingGlick - pt is intoxicated with ETOH, he has no other chemistry abnormalities of significance.  Will need reevaluation on awakening - again, pt has chronic pain in his left shoulder as well as chronic heavy ETOH use.  Vida RollerBrian D Zorana Brockwell, MD 07/24/13 [phone]

## 2013-07-24 NOTE — ED Provider Notes (Signed)
Patient initially seen and evaluated by Dr. Hyacinth MeekerMiller of her presenting with shoulder pain and alcohol intoxication. Pain shoulder pain is chronic. Initial alcohol level is 344. He was observed overnight. During this time, he was intermittently awakened was conversant when awake. He is able to ambulate without difficulty and will be discharged after getting breakfast.  Dione Boozeavid Jullian Previti, MD 07/24/13 [phone]

## 2013-07-24 NOTE — ED Notes (Signed)
Pt. To walk after he eats per Dr. Preston FleetingGlick.

## 2013-07-24 NOTE — ED Notes (Signed)
After breakfast pt is to ambulate and then discharged home

## 2013-12-08 ENCOUNTER — Encounter (HOSPITAL_COMMUNITY): Payer: Self-pay | Admitting: Emergency Medicine

## 2013-12-08 ENCOUNTER — Emergency Department (HOSPITAL_COMMUNITY)
Admission: EM | Admit: 2013-12-08 | Discharge: 2013-12-08 | Disposition: A | Discharge status: Home / Self Care | Payer: Self-pay | Attending: Emergency Medicine | Admitting: Emergency Medicine

## 2013-12-08 DIAGNOSIS — F17200 Nicotine dependence, unspecified, uncomplicated: Secondary | ICD-10-CM | POA: Insufficient documentation

## 2013-12-08 DIAGNOSIS — K089 Disorder of teeth and supporting structures, unspecified: Secondary | ICD-10-CM | POA: Insufficient documentation

## 2013-12-08 DIAGNOSIS — K0889 Other specified disorders of teeth and supporting structures: Secondary | ICD-10-CM

## 2013-12-08 MED ORDER — IBUPROFEN 400 MG PO TABS
800.0000 mg | ORAL_TABLET | Freq: Once | ORAL | Status: AC
  Administered 2013-12-08: 800 mg via ORAL

## 2013-12-08 MED ORDER — IBUPROFEN 400 MG PO TABS
800.0000 mg | ORAL_TABLET | Freq: Once | ORAL | Status: DC
  Filled 2013-12-08: qty 2

## 2013-12-08 MED ORDER — IBUPROFEN 800 MG PO TABS: 800.0000 mg | ORAL_TABLET | Freq: Three times a day (TID) | ORAL | Status: DC

## 2013-12-08 MED ORDER — PENICILLIN V POTASSIUM 250 MG PO TABS: 250.0000 mg | ORAL_TABLET | Freq: Four times a day (QID) | ORAL | Status: AC

## 2013-12-08 NOTE — ED Provider Notes (Signed)
CSN: [id number]     Arrival date & time 12/08/13  1500 History  This chart was scribed for non-physician practitioner working with Flint Melter, MD by Elveria Rising, ED Scribe. This patient was seen in room TR09C/TR09C and the patient's care was started at 4:07 PM.   Chief Complaint  Patient presents with  . Dental Pain    The history is provided by the patient. No language interpreter was used.   HPI Comments: Jake Silva is a 33 y.o. male who presents to the Emergency Department complaining of dental pain ongoing for five days now. Patient reports awakening to right jaw swelling this morning. Patient reports a tooth cracking/detaching and hanging loosely from his gum. He has not tried anything to alleviate his symptoms.  There are no aggravating factors.  Denies fevers, chills, nausea, or vomiting. Patient does not have a dentist here in [place].   History reviewed. No pertinent past medical history. History reviewed. No pertinent past surgical history. History reviewed. No pertinent family history. History  Substance Use Topics  . Smoking status: Current Every Day Smoker    Types: Cigarettes  . Smokeless tobacco: Not on file  . Alcohol Use: Yes    Review of Systems  Constitutional: Negative for fever and chills.  HENT: Positive for dental problem and facial swelling.   Respiratory: Negative for shortness of breath.   Cardiovascular: Negative for chest pain.  Gastrointestinal: Negative for nausea, vomiting, diarrhea and constipation.  Genitourinary: Negative for dysuria.    Allergies  Review of patient's allergies indicates no known allergies.  Home Medications   Prior to Admission medications   Not on File   Triage Vitals: BP 131/79  Pulse 105  Temp(Src) 98.1 F (36.7 C) (Oral)  Resp 18  SpO2 98% Physical Exam  Nursing note and vitals reviewed. Constitutional: He is oriented to person, place, and time. He appears well-developed and well-nourished. No  distress.  HENT:  Head: Normocephalic and atraumatic.  Mouth/Throat:    Poor dentition throughout.  Affected tooth as diagrammed.  No signs of peritonsillar or tonsillar abscess.  No signs of gingival abscess. Oropharynx is clear and without exudates.  Uvula is midline.  Airway is intact. No signs of Ludwig's angina with palpation of oral and sublingual mucosa.   Eyes: EOM are normal.  Neck: Neck supple.  Cardiovascular: Normal rate.   Pulmonary/Chest: Effort normal. No respiratory distress.  Musculoskeletal: Normal range of motion.  Neurological: He is alert and oriented to person, place, and time.  Skin: Skin is warm and dry.  Psychiatric: He has a normal mood and affect. His behavior is normal.    ED Course  Procedures (including critical care time)  COORDINATION OF CARE: 4:09 PM- Will refer patient to dentist. Plans to prescribe antibiotic and pain medication. Discussed treatment plan with patient at bedside and patient agreed to plan.   Labs Review Labs Reviewed - No data to display  Imaging Review No results found.   EKG Interpretation None      MDM   Final diagnoses:  Pain, dental    Patient with toothache.  No gross abscess.  Exam unconcerning for Ludwig's angina or spread of infection.  Will treat with penicillin and pain medicine.  Urged patient to follow-up with dentist.     I personally performed the services described in this documentation, which was scribed in my presence. The recorded information has been reviewed and is accurate.    Roxy Horseman, PA-C 12/08/13 [phone]

## 2013-12-08 NOTE — Discharge Instructions (Signed)

## 2013-12-08 NOTE — ED Notes (Signed)
Pt reports having right lower side dental pain for several days. No swelling noted. Airway intact.

## 2013-12-08 NOTE — ED Provider Notes (Signed)
Medical screening examination/treatment/procedure(s) were performed by non-physician practitioner and as supervising physician I was immediately available for consultation/collaboration.  Ardenia Stiner L Tia Gelb, MD 12/08/13 2319 

## 2014-01-23 ENCOUNTER — Encounter (HOSPITAL_COMMUNITY): Payer: Self-pay | Admitting: Emergency Medicine

## 2014-01-23 ENCOUNTER — Emergency Department (HOSPITAL_COMMUNITY): Payer: Self-pay

## 2014-01-23 ENCOUNTER — Observation Stay (HOSPITAL_COMMUNITY)
Admission: AD | Admit: 2014-01-23 | Discharge: 2014-01-24 | Disposition: A | Discharge status: Home / Self Care | Payer: Self-pay | Source: Intra-hospital | Attending: Family | Admitting: Family

## 2014-01-23 ENCOUNTER — Emergency Department (HOSPITAL_COMMUNITY)
Admission: EM | Admit: 2014-01-23 | Discharge: 2014-01-23 | Disposition: A | Discharge status: Other Acute Inpatient Hospital | Payer: Self-pay | Attending: Emergency Medicine | Admitting: Emergency Medicine

## 2014-01-23 ENCOUNTER — Encounter (HOSPITAL_COMMUNITY): Payer: Self-pay | Admitting: *Deleted

## 2014-01-23 DIAGNOSIS — Y998 Other external cause status: Secondary | ICD-10-CM | POA: Insufficient documentation

## 2014-01-23 DIAGNOSIS — Z590 Homelessness: Secondary | ICD-10-CM | POA: Insufficient documentation

## 2014-01-23 DIAGNOSIS — Y939 Activity, unspecified: Secondary | ICD-10-CM | POA: Insufficient documentation

## 2014-01-23 DIAGNOSIS — M25512 Pain in left shoulder: Secondary | ICD-10-CM

## 2014-01-23 DIAGNOSIS — F10120 Alcohol abuse with intoxication, uncomplicated: Secondary | ICD-10-CM | POA: Insufficient documentation

## 2014-01-23 DIAGNOSIS — F1010 Alcohol abuse, uncomplicated: Principal | ICD-10-CM | POA: Diagnosis present

## 2014-01-23 DIAGNOSIS — W19XXXA Unspecified fall, initial encounter: Secondary | ICD-10-CM

## 2014-01-23 DIAGNOSIS — Y929 Unspecified place or not applicable: Secondary | ICD-10-CM | POA: Insufficient documentation

## 2014-01-23 DIAGNOSIS — Z720 Tobacco use: Secondary | ICD-10-CM | POA: Insufficient documentation

## 2014-01-23 DIAGNOSIS — R52 Pain, unspecified: Secondary | ICD-10-CM

## 2014-01-23 DIAGNOSIS — F10920 Alcohol use, unspecified with intoxication, uncomplicated: Secondary | ICD-10-CM

## 2014-01-23 DIAGNOSIS — F17210 Nicotine dependence, cigarettes, uncomplicated: Secondary | ICD-10-CM | POA: Insufficient documentation

## 2014-01-23 DIAGNOSIS — S4992XA Unspecified injury of left shoulder and upper arm, initial encounter: Secondary | ICD-10-CM | POA: Insufficient documentation

## 2014-01-23 DIAGNOSIS — W1839XA Other fall on same level, initial encounter: Secondary | ICD-10-CM | POA: Insufficient documentation

## 2014-01-23 DIAGNOSIS — Z791 Long term (current) use of non-steroidal anti-inflammatories (NSAID): Secondary | ICD-10-CM | POA: Insufficient documentation

## 2014-01-23 HISTORY — DX: Alcohol abuse, uncomplicated: F10.10

## 2014-01-23 HISTORY — DX: Homelessness: Z59.0

## 2014-01-23 HISTORY — DX: Homelessness unspecified: Z59.00

## 2014-01-23 LAB — COMPREHENSIVE METABOLIC PANEL
ALBUMIN: 4.2 g/dL (ref 3.5–5.2)
ALT: 16 U/L (ref 0–53)
AST: 28 U/L (ref 0–37)
Alkaline Phosphatase: 59 U/L (ref 39–117)
Anion gap: 18 — ABNORMAL HIGH (ref 5–15)
BUN: 7 mg/dL (ref 6–23)
CO2: 22 mEq/L (ref 19–32)
Calcium: 8.9 mg/dL (ref 8.4–10.5)
Chloride: 98 mEq/L (ref 96–112)
Creatinine, Ser: 0.73 mg/dL (ref 0.50–1.35)
GFR calc Af Amer: >90 mL/min (ref >90)
GFR calc non Af Amer: >90 mL/min (ref >90)
Glucose, Bld: 111 mg/dL — ABNORMAL HIGH (ref 70–99)
Potassium: 3.9 mEq/L (ref 3.7–5.3)
Sodium: 138 mEq/L (ref 137–147)
Total Protein: 7.4 g/dL (ref 6.0–8.3)

## 2014-01-23 LAB — CBC WITH DIFFERENTIAL/PLATELET
BASOS ABS: 0 10*3/uL (ref 0.0–0.1)
BASOS PCT: 1 % (ref 0–1)
Eosinophils Absolute: 0 10*3/uL (ref 0.0–0.7)
Eosinophils Relative: 0 % (ref 0–5)
HCT: 40.4 % (ref 39.0–52.0)
HEMOGLOBIN: 14.3 g/dL (ref 13.0–17.0)
Lymphocytes Relative: 42 % (ref 12–46)
Lymphs Abs: 2 10*3/uL (ref 0.7–4.0)
MCH: 31.6 pg (ref 26.0–34.0)
MCHC: 35.4 g/dL (ref 30.0–36.0)
MCV: 89.4 fL (ref 78.0–100.0)
Monocytes Absolute: 0.4 10*3/uL (ref 0.1–1.0)
Monocytes Relative: 8 % (ref 3–12)
NEUTROS PCT: 49 % (ref 43–77)
Neutro Abs: 2.4 10*3/uL (ref 1.7–7.7)
Platelets: 229 10*3/uL (ref 150–400)
RBC: 4.52 MIL/uL (ref 4.22–5.81)
RDW: 14.1 % (ref 11.5–15.5)
WBC: 4.8 10*3/uL (ref 4.0–10.5)

## 2014-01-23 LAB — RAPID URINE DRUG SCREEN, HOSP PERFORMED
Amphetamines: NOT DETECTED
BARBITURATES: NOT DETECTED
BENZODIAZEPINES: NOT DETECTED
Cocaine: NOT DETECTED
Opiates: NOT DETECTED
TETRAHYDROCANNABINOL: NOT DETECTED

## 2014-01-23 LAB — ETHANOL: Alcohol, Ethyl (B): 349 mg/dL — ABNORMAL HIGH (ref 0–11)

## 2014-01-23 MED ORDER — LORAZEPAM 1 MG PO TABS: 0.0000 mg | ORAL_TABLET | Freq: Two times a day (BID) | ORAL | Status: DC

## 2014-01-23 MED ORDER — VITAMIN B-1 100 MG PO TABS
100.0000 mg | ORAL_TABLET | Freq: Every day | ORAL | Status: DC
  Administered 2014-01-24: 100 mg via ORAL
  Filled 2014-01-23 (×3): qty 1

## 2014-01-23 MED ORDER — LORAZEPAM 1 MG PO TABS: 1.0000 mg | ORAL_TABLET | Freq: Two times a day (BID) | ORAL | Status: DC

## 2014-01-23 MED ORDER — VITAMIN B-1 100 MG PO TABS
100.0000 mg | ORAL_TABLET | Freq: Every day | ORAL | Status: DC
  Administered 2014-01-23: 100 mg via ORAL
  Filled 2014-01-23: qty 1

## 2014-01-23 MED ORDER — HYDROXYZINE HCL 25 MG PO TABS: 25.0000 mg | ORAL_TABLET | Freq: Four times a day (QID) | ORAL | Status: DC | PRN

## 2014-01-23 MED ORDER — MAGNESIUM HYDROXIDE 400 MG/5ML PO SUSP: 30.0000 mL | Freq: Every day | ORAL | Status: DC | PRN

## 2014-01-23 MED ORDER — LORAZEPAM 1 MG PO TABS: 1.0000 mg | ORAL_TABLET | Freq: Four times a day (QID) | ORAL | Status: DC | PRN

## 2014-01-23 MED ORDER — ALUM & MAG HYDROXIDE-SIMETH 200-200-20 MG/5ML PO SUSP: 30.0000 mL | ORAL | Status: DC | PRN

## 2014-01-23 MED ORDER — ADULT MULTIVITAMIN W/MINERALS CH
1.0000 | ORAL_TABLET | Freq: Every day | ORAL | Status: DC
  Administered 2014-01-23 – 2014-01-24 (×2): 1 via ORAL
  Filled 2014-01-23 (×5): qty 1

## 2014-01-23 MED ORDER — LOPERAMIDE HCL 2 MG PO CAPS: 2.0000 mg | ORAL_CAPSULE | ORAL | Status: DC | PRN

## 2014-01-23 MED ORDER — ONDANSETRON 4 MG PO TBDP: 4.0000 mg | ORAL_TABLET | Freq: Four times a day (QID) | ORAL | Status: DC | PRN

## 2014-01-23 MED ORDER — LORAZEPAM 1 MG PO TABS: 1.0000 mg | ORAL_TABLET | Freq: Every day | ORAL | Status: DC

## 2014-01-23 MED ORDER — LORAZEPAM 1 MG PO TABS: 0.0000 mg | ORAL_TABLET | Freq: Four times a day (QID) | ORAL | Status: DC

## 2014-01-23 MED ORDER — THIAMINE HCL 100 MG/ML IJ SOLN: 100.0000 mg | Freq: Every day | INTRAMUSCULAR | Status: DC

## 2014-01-23 MED ORDER — LORAZEPAM 1 MG PO TABS
1.0000 mg | ORAL_TABLET | Freq: Four times a day (QID) | ORAL | Status: DC
  Administered 2014-01-23 – 2014-01-24 (×4): 1 mg via ORAL
  Filled 2014-01-23 (×4): qty 1

## 2014-01-23 MED ORDER — LORAZEPAM 1 MG PO TABS: 1.0000 mg | ORAL_TABLET | Freq: Three times a day (TID) | ORAL | Status: DC

## 2014-01-23 MED ORDER — ACETAMINOPHEN 325 MG PO TABS
650.0000 mg | ORAL_TABLET | Freq: Four times a day (QID) | ORAL | Status: DC | PRN
  Administered 2014-01-23: 650 mg via ORAL
  Filled 2014-01-23: qty 2

## 2014-01-23 MED ORDER — THIAMINE HCL 100 MG/ML IJ SOLN: 100.0000 mg | Freq: Once | INTRAMUSCULAR | Status: DC

## 2014-01-23 NOTE — ED Notes (Signed)
Pt to ED requesting detox from ETOH abuse. Reports "feeling like I could harm myself." Denies plan. Pt tearful when asked about harming self. Pt also c/o L shoulder pain after falling tonight. Pt was walking down the sidewalk when he was staggering and fell onto L shoulder. Last drink was tonight. Pt reports averaging "8-40oz beers and a pint of liquor or a fifth of liquor without the beer"

## 2014-01-23 NOTE — ED Notes (Signed)
Pt. arrived with PTAR from a parking lot requesting detox from alcohol abuse ,  Pt. stated he fell this evening and complaining of left shoulder joint pain . Denies suicidal ideation .

## 2014-01-23 NOTE — ED Notes (Signed)
The patient is unable to give an urine specimen at this time. The RN in charge is aware. 

## 2014-01-23 NOTE — ED Provider Notes (Signed)
10:00  Pt more awake.   Pt reports he needs help with alcohol.   Pt reports he has suicidal and homicidal thoughts,  Mostly when he is drinking.   Pt reports when he is more sober he has fewer thought. No current plan.  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   Lonia SkinnerLeslie K BurbankSofia, PA-C 01/23/14 1022  Richardean Canalavid H Yao, MD 01/23/14 Paulo [id number]

## 2014-01-23 NOTE — Progress Notes (Signed)
Pt alert, oriented and cooperative. Denies SI/HI @ present.[name]. -A/V hall. C/o left shoulder pain r/t recent fall that aggravated an old injury.  "I've been drinking all my life, I'm always doing stupid things because of drinking". Emotional support and encouragement given. Will continue to monitor closely and evaluate for stabilization.

## 2014-01-23 NOTE — Progress Notes (Signed)
[facility] INPATIENT:  Family/Significant Other Suicide Prevention Education  Suicide Prevention Education:  Patient Refusal for Family/Significant Other Suicide Prevention Education: The patient Jake Silva has refused to provide written consent for family/significant other to be provided Family/Significant Other Suicide Prevention Education during admission and/or prior to discharge.     [name], Damarion Mendizabal G 01/23/2014, 10:25 PM

## 2014-01-23 NOTE — ED Notes (Signed)
Security paged to wand patient 

## 2014-01-23 NOTE — ED Notes (Signed)
Pt given paper scrubs to change into 

## 2014-01-23 NOTE — ED Notes (Signed)
Security at bedside to wand patient. 

## 2014-01-23 NOTE — ED Notes (Signed)
Pt's belongings inventoried and placed in container
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
## 2014-01-23 NOTE — ED Notes (Signed)
Meal tray ordered for patient.

## 2014-01-23 NOTE — ED Notes (Addendum)
Contacted staffing for sitter  

## 2014-01-23 NOTE — ED Provider Notes (Signed)
Patient accepted to behavioral health, accepting physician is Dr. Florentina JennyKumar  Younes Degeorge T Romi Rathel, MD 01/23/14 [phone]

## 2014-01-23 NOTE — ED Notes (Signed)
Lunch tray ordered for pt.

## 2014-01-23 NOTE — [facility] Counselor (Signed)
TA machine is not working, therefore no teleassessment completed.. Critical ticket has been submitted. Pt has been [facility] by Dr Lucianne MussKumar. Writer spoke w/ pt's RN Kriste BasqueBecky who will arrange transportation through [facility] and have pt sign voluntary consent.  Evette Cristalaroline Paige Abaigeal Moomaw, [place] Assessment Counselor

## 2014-01-24 DIAGNOSIS — F1014 Alcohol abuse with alcohol-induced mood disorder: Secondary | ICD-10-CM
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
## 2014-01-24 NOTE — Progress Notes (Signed)
D:Pt reports problems with his teeth and asked about seeing a dentist. A:Gave pt telephone number for [facility] and telephone number for Dr. Lucky CowboyKnox. R:Pt denies si and hi. Safety maintained in the OBS unit.

## 2014-01-24 NOTE — Progress Notes (Signed)
Pt d/c from the unit with a bus pass. All items returned. Pt denies si and hi.

## 2014-01-24 NOTE — Progress Notes (Signed)
Patient ID: Jake Silva, male   DOB: 05/21/1980, 33 y.o.   MRN: [id number] D: Patient denies SI/HI and auditory and visual hallucinations. Patient has a depressed mood and affect. Complains of sweating, and states he just needs to sleep.   States he has lots of problems in his life caused by his drinking.  A: Patient given emotional support from RN. Patient given medications per MD orders.  Patient encouraged to come to staff with any questions or concerns.  R: Patient remains cooperative and appropriate.  Is able to sleep without difficulty. Will continue to monitor patient for safety.

## 2014-01-24 NOTE — Discharge Instructions (Signed)
To help you maintain a sober lifestyle a substance abuse rehabilitation program may be helpful to you.  Contact one of the following service providers to enroll in their program:       [facility] [place]      [address]      [place], [place] [address]      [phone]       [facility] ([facility]) - [place]      [address]      [place], [place] [address]      [phone]        [facility]      [address]      [place], [place] [address]      [phone]       [facility]      [address]      [place], [place] [address]      [phone]      [facility] offers both outpatient substance abuse treatment and transitional housing.  To enter their outpatient program, you simply need to go there any weekday morning, Monday - Friday, at 9:00 am.  For transitional housing there is an application process.  If you are interested, ask staff about it when you go for treatment.  There is usually a waiting period to receive this service.

## 2014-01-24 NOTE — Plan of Care (Signed)
[facility] Observation Crisis Plan  Reason for Crisis Plan:  Substance Abuse   Plan of Care:  Referral for Substance Abuse  Family Support:    Sister, others  Current Living Environment:  Living Arrangements: Other relatives (Lives with sister at times. Trying to get his own place.); he can return to the household.  Insurance:  Self Pay Hospital Account    Name Acct ID Class Status Primary Coverage   [name], Jake Silva [id number] BEHAVIORAL HEALTH OBSERVATION Open None        Guarantor Account (for Hospital Account [id number])    Name Relation to Pt Service Area Active? Acct Type   [name], Jake Silva Self [facility] Yes Behavioral Health   Address Phone       [address] [place], [place] [address] [phone])          Coverage Information (for Hospital Account [id number])    Not on file      Legal Guardian:   Self  Primary Care Provider:  Default, Provider, MD; None  Current Outpatient Providers:  None  Psychiatrist:   None  Counselor/Therapist:   None  Compliant with Medications:  Yes; pt only uses Ibuprofen.  Additional Information: After consulting with Claudette Headonrad Withrow, NP it has been determined that pt does not present a life threatening danger to himself or others, and that psychiatric hospitalization is not indicated for him at this time.  Renata CapriceConrad reports that pt would benefit from substance abuse rehabilitation treatment, and that he would prefer outpatient services.  Pt's discharge instructions will include referral information for [facility] (both in [place] and in [place], at pt's request), [facility], and [facility].  He has requested a [place] bus pass, which has been left with his nurse.  Doylene Canninghomas Harace Mccluney, MA Triage Specialist Raphael GibneyHughes, [name] 11/11/20153:15 PM

## 2014-02-04 NOTE — Discharge Summary (Signed)
[facility] OBS UNIT DISCHARGE SUMMARY  Chief Complaint:  ALCOHOL USE DISORDER   Assessment: Pt seen and chart reviewed. Pt denies SI, HI, AVH, contracts for safety. Pt is declining all physical withdrawal symptoms which is congruent with the CIWA (consistently under 5 at this time). Pt spent the night in the OBS unit without incident and was cooperative with staff members. Pt open to outpatient treatment for depression and substance abuse; to be assisted [facility].   History of Present Illness:: Patient is a 33 year old African American Male who presented to the ED for complaints of left shoulder pain and alcohol detoxification.  Patient reported passive SI prior to transfer to observation unit.  Patient states he has a long history of substance use  but denies any formal substance abuse treatment.  Patient does not currently attend AA meeting stating "I only went to AA when I was in jail"  Patient reports that he would like to attend an outpatient substance abuse rehabilitation to obtain sobriety.      Elements:  Location:  generalized. Quality:  acute. Severity:  severe. Timing:  acute and chronic problem. Duration:  since adolescence . Context:  stressors; homelessness. Associated Signs/Synptoms: Depression Symptoms:  denies current ideation, reports has fleeting SI when intoxicated (Hypo) Manic Symptoms: none Anxiety Symptoms:  Excessive Worry, Psychotic Symptoms: denies  PTSD Symptoms: denies  Total Time spent with patient: 15 minutes  Psychiatric Specialty Exam: Physical Exam  Nursing note and vitals reviewed. Full Physical Exam performed in ED; reviewed, stable, and I concur with this assessment.   Review of Systems  Constitutional: Negative.   HENT: Negative.   Eyes: Negative.   Respiratory: Negative.   Cardiovascular: Negative.   Gastrointestinal: Negative.   Genitourinary: Negative.   Musculoskeletal: Positive for joint pain (left shoulder).  Skin: Negative.    Neurological: Negative.   Endo/Heme/Allergies: Negative.   Psychiatric/Behavioral: Positive for substance abuse. Negative for suicidal ideas. The patient is nervous/anxious.     Blood pressure 120/81, pulse 92, temperature 98.6 F (37 C), temperature source Oral, resp. rate 16, height 5\' 7"  (1.702 m), weight 54.432 kg (120 lb), SpO2 99 %.Body mass index is 18.79 kg/(m^2).  General Appearance: Casual  Eye Contact::  Fair  Speech:  Clear and Coherent  Volume:  Normal  Mood:  Euthymic  Affect:  Congruent  Thought Process:  Coherent, Goal Directed and Loose  Orientation:  Full (Time, Place, and Person)  Thought Content:  WDL  Suicidal Thoughts:  No  Homicidal Thoughts:  No  Memory:  Immediate;   Good Recent;   Good Remote;   Good  Judgement:  Fair makes independent detrimental decisions  Insight:  Fair  Psychomotor Activity:  Decreased  Concentration:  Fair  Recall:  Good  Fund of Knowledge:Good  Language: Good  Akathisia:  No  Handed:  Right  AIMS (if indicated):     Assets:  Desire for Improvement Leisure Time Social Support Vocational/Educational  Sleep:       Musculoskeletal: Strength & Muscle Tone: within normal limits Gait & Station: normal Patient leans: N/A  Past Medical History:   Past Medical History  Diagnosis Date  . Alcohol abuse   . Homelessness      Past Medical History  Diagnosis Date  . Alcohol abuse   . Homelessness    Allergies:  No Known Allergies PTA Medications: No prescriptions prior to admission    Previous Psychotropic Medications:  Medication/Dose  SEE MAR  Substance Abuse History in the last 12 months:  Yes.    Consequences of Substance Abuse: Withdrawal Symptoms:   Tremors  Social History:  reports that he has been smoking Cigarettes.  He has been smoking about 0.00 packs per day. He does not have any smokeless tobacco history on file. He reports that he drinks alcohol. He reports that he does not use  illicit drugs. Additional Social History:                       Family History:  History reviewed. No pertinent family history.  No results found for this or any previous visit (from the past 72 hour(s)). 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 Alcohol abuse   . Homelessness    AXIS IV:  economic problems, housing problems and problems with access to health care services AXIS V:  51-60 moderate symptoms  Treatment Plan/Recommendations:  Refer for outpatient substance abuse treatment to obtain sobriety  Treatment Plan Summary: Medication management Current Medications:  No current facility-administered medications for this encounter.   No current outpatient prescriptions on file.   Beau FannyWithrow, Cayce Quezada C, FNP-BC 01/24/2014 5:08 PM

## 2014-08-06 ENCOUNTER — Encounter (HOSPITAL_COMMUNITY): Payer: Self-pay | Admitting: Family Medicine

## 2014-08-06 ENCOUNTER — Emergency Department (HOSPITAL_COMMUNITY)
Admission: EM | Admit: 2014-08-06 | Discharge: 2014-08-06 | Disposition: A | Discharge status: Home / Self Care | Payer: Self-pay | Attending: Emergency Medicine | Admitting: Emergency Medicine

## 2014-08-06 DIAGNOSIS — R369 Urethral discharge, unspecified: Secondary | ICD-10-CM | POA: Insufficient documentation

## 2014-08-06 DIAGNOSIS — Z7251 High risk heterosexual behavior: Secondary | ICD-10-CM

## 2014-08-06 DIAGNOSIS — Z720 Tobacco use: Secondary | ICD-10-CM | POA: Insufficient documentation

## 2014-08-06 DIAGNOSIS — N4889 Other specified disorders of penis: Secondary | ICD-10-CM | POA: Insufficient documentation

## 2014-08-06 DIAGNOSIS — R300 Dysuria: Secondary | ICD-10-CM | POA: Insufficient documentation

## 2014-08-06 DIAGNOSIS — Z590 Homelessness: Secondary | ICD-10-CM | POA: Insufficient documentation

## 2014-08-06 LAB — URINE MICROSCOPIC-ADD ON

## 2014-08-06 LAB — URINALYSIS, ROUTINE W REFLEX MICROSCOPIC
GLUCOSE, UA: NEGATIVE mg/dL
Hgb urine dipstick: NEGATIVE
KETONES UR: 15 mg/dL — AB
NITRITE: NEGATIVE
PH: 6 (ref 5.0–8.0)
Protein, ur: NEGATIVE mg/dL
SPECIFIC GRAVITY, URINE: 1.029 (ref 1.005–1.030)
UROBILINOGEN UA: 1 mg/dL (ref 0.0–1.0)

## 2014-08-06 MED ORDER — AZITHROMYCIN 250 MG PO TABS
1000.0000 mg | ORAL_TABLET | Freq: Once | ORAL | Status: AC
  Administered 2014-08-06: 1000 mg via ORAL
  Filled 2014-08-06: qty 4

## 2014-08-06 MED ORDER — AZITHROMYCIN 1 G PO PACK: 1.0000 g | PACK | Freq: Once | ORAL | Status: DC

## 2014-08-06 MED ORDER — LIDOCAINE HCL (PF) 1 % IJ SOLN
0.9000 mL | Freq: Once | INTRAMUSCULAR | Status: AC
  Administered 2014-08-06: 0.9 mL
  Filled 2014-08-06: qty 5

## 2014-08-06 MED ORDER — LIDOCAINE HCL 2 % IJ SOLN: 5.0000 mL | Freq: Once | INTRAMUSCULAR | Status: DC

## 2014-08-06 MED ORDER — CEFTRIAXONE SODIUM 250 MG IJ SOLR
250.0000 mg | Freq: Once | INTRAMUSCULAR | Status: AC
  Administered 2014-08-06: 250 mg via INTRAMUSCULAR
  Filled 2014-08-06: qty 250

## 2014-08-06 NOTE — Discharge Instructions (Signed)
Sexually Transmitted Disease Inform your partner(s) about getting tested and treated for STD exposure. A sexually transmitted disease (STD) is a disease or infection often passed to another person during sex. However, STDs can be passed through nonsexual ways. An STD can be passed through:  Spit (saliva).  Semen.  Blood.  Mucus from the vagina.  Pee (urine). HOW CAN I LESSEN MY CHANCES OF GETTING AN STD?  Use:  Latex condoms.  Water-soluble lubricants with condoms. Do not use petroleum jelly or oils.  Dental dams. These are small pieces of latex that are used as a barrier during oral sex.  Avoid having more than one sex partner.  Do not have sex with someone who has other sex partners.  Do not have sex with anyone you do not know or who is at high risk for an STD.  Avoid risky sex that can break your skin.  Do not have sex if you have open sores on your mouth or skin.  Avoid drinking too much alcohol or taking illegal drugs. Alcohol and drugs can affect your good judgment.  Avoid oral and anal sex acts.  Get shots (vaccines) for HPV and hepatitis.  If you are at risk of being infected with HIV, it is advised that you take a certain medicine daily to prevent HIV infection. This is called pre-exposure prophylaxis (PrEP). You may be at risk if:  You are a man who has sex with other men (MSM).  You are attracted to the opposite sex (heterosexual) and are having sex with more than one partner.  You take drugs with a needle.  You have sex with someone who has HIV.  Talk with your doctor about if you are at high risk of being infected with HIV. If you begin to take PrEP, get tested for HIV first. Get tested every 3 months for as long as you are taking PrEP. WHAT SHOULD I DO IF I THINK I HAVE AN STD?  See your doctor.  Tell your sex partner(s) that you have an STD. They should be tested and treated.  Do not have sex until your doctor says it is okay. WHEN SHOULD I GET  HELP? Get help right away if:  You have bad belly (abdominal) pain.  You are a man and have puffiness (swelling) or pain in your testicles.  You are a woman and have puffiness in your vagina. Document Released: 04/09/2004 Document Revised: 03/07/2013 Document Reviewed: 08/26/2012 [facility] Patient Information 2015 [place], [place]. This information is not intended to replace advice given to you by your health care provider. Make sure you discuss any questions you have with your health care provider.  Emergency Department Resource Guide 1) Find a Doctor and Pay Out of Pocket Although you won't have to find out who is covered by your insurance plan, it is a good idea to ask around and get recommendations. You will then need to call the office and see if the doctor you have chosen will accept you as a new patient and what types of options they offer for patients who are self-pay. Some doctors offer discounts or will set up payment plans for their patients who do not have insurance, but you will need to ask so you aren't surprised when you get to your appointment.  2) Contact Your Local Health Department Not all health departments have doctors that can see patients for sick visits, but many do, so it is worth a call to see if yours does. If you  don't know where your local health department is, you can check in your phone book. The CDC also has a tool to help you locate your state's health department, and many state websites also have listings of all of their local health departments.  3) Find a Walk-in Clinic If your illness is not likely to be very severe or complicated, you may want to try a walk in clinic. These are popping up all over the country in pharmacies, drugstores, and shopping centers. They're usually staffed by nurse practitioners or physician assistants that have been trained to treat common illnesses and complaints. They're usually fairly quick and inexpensive. However, if you have  serious medical issues or chronic medical problems, these are probably not your best option.  No Primary Care Doctor: - Call Health Connect at  [phone] - they can help you locate a primary care doctor that  accepts your insurance, provides certain services, etc. - Physician Referral Service- [phone]  Chronic Pain Problems: Organization         Address  Phone   Notes  [facility]  [phone] Patients need to be referred by their primary care doctor.   Medication Assistance: Organization         Address  Phone   Notes  [facility] Medication [facility] [address] [place] [place], [place] [address] [phone] --Must be a resident of [facility] -- Must have NO insurance coverage whatsoever (no Medicaid/ Medicare, etc.) -- The pt. MUST have a primary care doctor that directs their care regularly and follows them in the community   [facility]ist  [phone]   [employer]  [phone]    Agencies that provide inexpensive medical care: Organization         Address  Phone   Notes  [facility]  [phone]   [facility]    [phone]   [facility] [address] [place], [place] [address] [phone]   [facility] [address] [place] [phone]   Planned Parenthood    [phone]   [facility]    [phone]   [facility]  [address], [place] Phone:  [phone], Fax:  [phone] Hours of Operation:  9 am - 6 pm, M-F.  Also accepts Medicaid/Medicare and self-pay.  [facility]  [address], [place] Phone: [phone], Fax: [phone]. Hours of Operation:  8:30 am - 5:30 pm, M-F.  Also accepts Medicaid and self-pay.  [facility] [place] [address] [place] [address] [place] Phone: [phone]   [facility] [address] Natasha Bence [place], [place] [phone], Ext. [phone] Mondays & Thursdays: 7-9 AM.  First 15 patients are seen on a first come, first serve basis.    Medicaid-accepting [facility] Providers:  Organization         Address  Phone   Notes  [facility] [address], [place] [phone] Also accepts self-pay patients.  [facility] [address] Laurell Josephs [place], [place]  [phone]   [facility] [address], [place] [phone]   [facility] [address], [place] [phone]   Renaye Rakers [address], [place]   [phone] Only accepts [place]  Access Medicaid patients after they have their name applied to their card.   Self-Pay (no insurance) in [facility]:  Organization         Address  Phone   Notes  Sickle Cell Patients, [facility] [address] [place], [place] [phone]   [facility] [address] [place], [place] [phone]   [facility] [place]  [address], [place] [phone]   [facility]/Dr. Osei-Bonsu  [address], [place] or [address], [place] [phone] Phone number for both [place] and [place] locations is the same.  [facility] [address] [place] [phone]   [facility] [address], [place] or [address] [phone]   [facility] [address], [place] [phone], phone; [phone], fax Sees patients 1st and 3rd Saturday of every month.  Must not qualify for public or private insurance (i.e. Medicaid, Medicare, [place] Health Choice, Veterans' Benefits)  Household income should be no more than 200% of the poverty level The clinic cannot treat you if you are pregnant or think you are pregnant   Sexually transmitted diseases are not treated at the clinic.    Dental Care: Organization         Address  Phone  Notes  [facility] of [facility] [address] [place], [place] [phone] Accepts children up to age 55 who are enrolled in [place] or [place] Health Choice; pregnant women with a Medicaid card; and children who have applied for Medicaid or [place] Health Choice, but were declined, whose parents can pay a reduced fee at time of service.  [facility] Department of [facility]  [address], [place] [phone] Accepts children up to age 28 who are enrolled in [place] or [place] Health Choice; pregnant women with a Medicaid card; and children who have applied for Medicaid or [place] Health Choice, but were declined, whose parents can pay a reduced fee at time of service.  [facility]  [address] [place], [place] [phone] Patients are seen by appointment only. Walk-ins are not accepted. [facility] will see patients 22 years of age and older. Monday - Tuesday (8am-5pm) Most Wednesdays (8:30-5pm) $30 per visit, cash only  [facility] Adult Dental Access PROGRAM  [address], [facility] [phone] Patients are seen by appointment only. Walk-ins are not accepted. [facility] will see patients 52 years of age and older. One Wednesday Evening (Monthly: Volunteer Based).  $30 per visit, cash only  [employer]  [phone] for adults; Children under age 59, call Graduate Pediatric Dentistry at [phone]. Children aged 71-14, please call [phone] to request a pediatric application.  Dental services are provided in all areas of dental care including fillings, crowns and bridges, complete and partial dentures, implants, gum treatment, root canals, and extractions. Preventive care is also provided. Treatment is provided to both adults and children. Patients  are selected via a lottery and there is often a waiting list.   [facility] [address] [place]  [phone] [email or web address]   [facility] [address] [place], [place] [phone], Ext. [phone] Second and Fourth Thursday of each month, opens at 6:30 AM; Clinic ends at 9  AM.  Patients are seen on a first-come first-served basis, and a limited number are seen during each clinic.   [facility]  [address] Ether Griffins [place], [place] [phone]   Eligibility Requirements You must have lived in [place], [place], or [place] counties for at least the last three months.   You cannot be eligible for state or federal sponsored [place], including [employer], [place], or [employer].   You generally cannot be eligible for healthcare insurance through your employer.    How to apply: Eligibility screenings are held every Tuesday and Wednesday afternoon from 1:00 pm until 4:00 pm. You do not need an appointment for the interview!  [facility] [address] [place], [place] [phone]   [facility] Health Department  [phone]   [facility] Health Department  [phone]   [facility] Health Department  [phone]    Behavioral Health Resources in the Community: Intensive Outpatient Programs Organization         Address  Phone  Notes  [facility] [address], [place], [place] [phone]   [facility] Outpatient [address], [place], [place] [phone]   [facility] [address], [place], [place]  [phone]   [facility] Mental Health [address]  [place], [place] [phone] or [phone]   Substance Abuse Resources Organization         Address  Phone  Notes  [facility]  [phone]   [facility]  [phone]   [facility]  [phone]   Floydene Flock  [phone]    Residential & Outpatient Substance Abuse Program  [phone]   Psychological Services Organization         Address  Phone  Notes  [facility] Behavioral Health  [phone]   [facility]  [phone]   [facility] Mental [facility] [address] [place] [phone] or [phone]    Mobile Crisis Teams Organization         Address  Phone  Notes  [facility], Mobile Crisis Care Unit  [phone]   [facility]  [address] [place], [place] [phone]   Doristine Locks [address] [place] [phone]    Self-Help/Support Groups Organization         Address  Phone             Notes  [facility] [place] - variety of support groups  336- [phone] Call for more information  Narcotics Anonymous (NA), [facility] [address], [employer] [place]  2 meetings at this location   Statistician         Address  Phone  Notes  [facility] [address],    [place]  [phone]   [facility]  [address] [place] [address], [place], [place] [phone]   [facility] [address] [place], [place] [phone] Admissions: 8am-3pm M-F  [facility] [address]    [place], [place] [phone]   [facility] [address] Starling Manns [place], [place] [phone]   [facility] [address]  [place], [place] [phone]   [facility] - Intensive Outpatient [address]ure[address] Joseph[address], [place], [place] [phone]   [facility] ([facility] [address]  [place], [place] [phone] or [phone]   [facility] ([facility])  [address], [place], [place] [phone] Accepts Medicaid  [facility] [address],  [place] [phone] Substance Abuse/Addiction Treatment   [facility] Organization         Address  Phone  Notes  [facility]  [phone]   Angie Fava, PhD [address] Ervin Knack [place], [place]   [phone] or [phone]   [facility] Behavioral   [address] [place], [place] [phone]   [facility] [address], [place], [place] [phone] Insurance/Medicaid/sponsorship through [facility] and Families [address]                                    [place], [place] [phone] Therapy/tele-psych/case  [facility] [address], [place] [phone]    Dr. Lolly Mustache  [phone]   [facility]  [employer] [facility] Dept. 1) [address], [place] 2) [address], [place] 3)  [address], [place] [phone]  [phone]   [facility] Child Abuse Hotline [phone] or [phone] (After Hours)

## 2014-08-06 NOTE — ED Provider Notes (Signed)
CSN: [id number]     Arrival date & time 08/06/14  1807 History   This chart was scribed for non-physician practitioner working, Haynes Dage, PA-C,  with Donnetta Hutching, MD, by Modena Jansky, ED Scribe. This patient was seen in room TR07C/TR07C and the patient's care was started at 6:34 PM.   Chief Complaint  Patient presents with  . Penile Discharge   The history is provided by the patient. No language interpreter was used.   HPI Comments: Jake Silva is a 34 y.o. male who presents to the Emergency Department complaining of constant moderate yellow penile discharge that started 2 days ago and dysuria for the past 2 weeks. He requests to be tested for all STDs. He reports having associated penile pain, "feels like razor blades when I pee." He reports having some unusual fatigue lately also. He denies treatment PTA. He report having only one sexual partner in the past 6 months. He states that he has a hx of STD at age 76. He denies any fever, testicular pain, abdominal pain, rectal pain, or genital sores.   Past Medical History  Diagnosis Date  . Alcohol abuse   . Homelessness    History reviewed. No pertinent past surgical history. History reviewed. No pertinent family history. History  Substance Use Topics  . Smoking status: Current Every Day Smoker    Types: Cigarettes  . Smokeless tobacco: Not on file  . Alcohol Use: Yes    Review of Systems  Constitutional: Negative for fatigue.  Gastrointestinal: Negative for abdominal pain and rectal pain.  Genitourinary: Positive for dysuria, discharge and penile pain. Negative for genital sores and testicular pain.    Allergies  Review of patient's allergies indicates no known allergies.  Home Medications   Prior to Admission medications   Not on File   BP 149/101 mmHg  Pulse 66  Temp(Src) 98.6 F (37 C)  Resp 18  SpO2 99% Physical Exam  Constitutional: He is oriented to person, place, and time. He appears well-developed  and well-nourished. No distress.  HENT:  Head: Normocephalic and atraumatic.  Neck: Neck supple. No tracheal deviation present.  Cardiovascular: Normal rate.   Pulmonary/Chest: Effort normal. No respiratory distress.  Genitourinary: Testes normal and penis normal. Circumcised. No penile tenderness.  Exam (chaperone present): minimum amount of white discharge.  No genital sores.   Musculoskeletal: Normal range of motion.  Neurological: He is alert and oriented to person, place, and time.  Skin: Skin is warm and dry.  Psychiatric: He has a normal mood and affect. His behavior is normal.  Nursing note and vitals reviewed.   ED Course  Procedures (including critical care time) DIAGNOSTIC STUDIES: Oxygen Saturation is 99% on RA, normal by my interpretation.    COORDINATION OF CARE: 6:39 PM- Pt advised of plan for treatment which includes labs and pt agrees.  Labs Review Labs Reviewed  URINALYSIS, ROUTINE W REFLEX MICROSCOPIC - Abnormal; Notable for the following:    APPearance CLOUDY (*)    Bilirubin Urine SMALL (*)    Ketones, ur 15 (*)    Leukocytes, UA MODERATE (*)    All other components within normal limits  URINE MICROSCOPIC-ADD ON  RPR  HIV ANTIBODY (ROUTINE TESTING)  GC/CHLAMYDIA PROBE AMP ([place])    Imaging Review No results found.   EKG Interpretation None      MDM   Final diagnoses:  Penile discharge  Unprotected sexual intercourse  Patient presents urethral discharge and dysuria.  I ordered STD  panel and treated him for gonorrhea and chlamydia. I discussed informing his partner and that she would need treatment also. I explained that the hospital would call if the test results were positive for STD's.  Medications  cefTRIAXone (ROCEPHIN) injection 250 mg (250 mg Intramuscular Given 08/06/14 2051)  azithromycin (ZITHROMAX) tablet 1,000 mg (1,000 mg Oral Given 08/06/14 2051)  lidocaine (PF) (XYLOCAINE) 1 % injection 0.9 mL (0.9 mLs Other Given 08/06/14  2051)   I personally performed the services described in this documentation, which was scribed in my presence. The recorded information has been reviewed and is accurate.   Catha GosselinHanna Patel-Mills, PA-C 08/06/14 2315  Donnetta HutchingBrian Cook, MD 08/08/14 [phone]

## 2014-08-06 NOTE — ED Notes (Signed)
Pt here for 2 weeks of burning  With peeing and penile discharge.

## 2014-08-07 LAB — RPR: RPR Ser Ql: NONREACTIVE

## 2014-08-07 LAB — GC/CHLAMYDIA PROBE AMP (~~LOC~~) NOT AT ARMC
Chlamydia: POSITIVE — AB
Neisseria Gonorrhea: POSITIVE — AB

## 2014-08-07 LAB — HIV ANTIBODY (ROUTINE TESTING W REFLEX): HIV Screen 4th Generation wRfx: NONREACTIVE

## 2014-08-08 ENCOUNTER — Telehealth (HOSPITAL_COMMUNITY): Payer: Self-pay

## 2014-08-08 NOTE — ED Notes (Signed)
Positive for gonorrhea and chlamydia. Treated per protocol. [employer]HS form faxed. Attempting to contact pt.  

## 2014-08-09 ENCOUNTER — Telehealth (HOSPITAL_BASED_OUTPATIENT_CLINIC_OR_DEPARTMENT_OTHER): Payer: Self-pay | Admitting: Emergency Medicine

## 2014-08-10 ENCOUNTER — Telehealth (HOSPITAL_BASED_OUTPATIENT_CLINIC_OR_DEPARTMENT_OTHER): Payer: Self-pay | Admitting: Emergency Medicine

## 2014-09-17 ENCOUNTER — Telehealth (HOSPITAL_COMMUNITY): Payer: Self-pay

## 2014-09-17 NOTE — ED Notes (Signed)
Unable to contact pt by mail or telephone. Unable to communicate lab results or treatment changes. 

## 2014-09-30 ENCOUNTER — Telehealth (HOSPITAL_BASED_OUTPATIENT_CLINIC_OR_DEPARTMENT_OTHER): Payer: Self-pay | Admitting: Emergency Medicine

## 2014-09-30 NOTE — Telephone Encounter (Signed)
Letter returned with no known forwarding address, lost to followup

## 2016-01-21 ENCOUNTER — Emergency Department (HOSPITAL_COMMUNITY)
Admission: EM | Admit: 2016-01-21 | Discharge: 2016-01-21 | Disposition: A | Discharge status: Home / Self Care | Payer: Self-pay | Attending: Emergency Medicine | Admitting: Emergency Medicine

## 2016-01-21 DIAGNOSIS — N342 Other urethritis: Secondary | ICD-10-CM | POA: Insufficient documentation

## 2016-01-21 DIAGNOSIS — F17210 Nicotine dependence, cigarettes, uncomplicated: Secondary | ICD-10-CM | POA: Insufficient documentation

## 2016-01-21 LAB — HIV ANTIBODY (ROUTINE TESTING W REFLEX): HIV Screen 4th Generation wRfx: NONREACTIVE

## 2016-01-21 LAB — RPR: RPR: NONREACTIVE

## 2016-01-21 MED ORDER — AZITHROMYCIN 250 MG PO TABS
1000.0000 mg | ORAL_TABLET | Freq: Once | ORAL | Status: AC
Start: 2016-01-21 — End: 2016-01-21
  Administered 2016-01-21: 1000 mg via ORAL
  Filled 2016-01-21: qty 4

## 2016-01-21 MED ORDER — CEFTRIAXONE SODIUM 250 MG IJ SOLR
250.0000 mg | Freq: Once | INTRAMUSCULAR | Status: AC
  Administered 2016-01-21: 250 mg via INTRAMUSCULAR
  Filled 2016-01-21: qty 250

## 2016-01-21 MED ORDER — LIDOCAINE HCL (PF) 1 % IJ SOLN
INTRAMUSCULAR | Status: AC
  Filled 2016-01-21: qty 5

## 2016-01-21 NOTE — Discharge Instructions (Signed)
Please read and follow all provided instructions.  Your diagnoses today include:  1. Urethritis     Tests performed today include:  Test for gonorrhea and chlamydia.   Test for HIV and syphilis.   You will be notified by telephone with any positive results.   Vital signs. See below for your results today.   Medications:  You were treated with azithromycin and rocephin today. These antibiotics treat you for gonorrhea and chlamydia. They do not treat for HIV or syphilis.   Home care instructions:  Read educational materials contained in this packet and follow any instructions provided.   You should tell your partners about your infection and avoid having sex for one week to allow time for the medicine to work.  Sexually transmitted disease testing also available at:   [facility] [place]  [address], [place], phone [phone] or [phone]    Monday - Friday, call for an appointment  [facility] [place]  [address], [place], phone [phone] or [phone]   Monday - Friday, call for an appointment  Return instructions:   Please return to the Emergency Department if you experience worsening symptoms.   Please return if you have any other emergent concerns.  Additional Information:  Your vital signs today were: BP 126/87 (BP Location: Right Arm)    Pulse 84    Temp 98.2 F (36.8 C) (Oral)    Resp 20    Ht 5\' 7"  (1.702 m)    Wt 59 kg    SpO2 99%    BMI 20.36 kg/m  If your blood pressure (BP) was elevated above 135/85 this visit, please have this repeated by your doctor within one month. --------------

## 2016-01-21 NOTE — ED Provider Notes (Signed)
[facility]-EMERGENCY DEPT Provider Note   CSN: [id number] Arrival date & time: 01/21/16  0710     History   Chief Complaint Chief Complaint  Patient presents with  . Penile Discharge    HPI Jake Silva is a 35 y.o. male.  Patient presents with complaint of dysuria 5 days and penile discharge for the past 3 days. Patient reports 3 recent episodes of unprotected sexual intercourse with women approximately 3 weeks ago. Patient denies other symptoms. No treatments prior to arrival. He states that he has had similar symptoms in the past related to sexually transmitted infections. The onset of this condition was acute. The course is constant. Aggravating factors: none. Alleviating factors: none.        Past Medical History:  Diagnosis Date  . Alcohol abuse   . Homelessness     Patient Active Problem List   Diagnosis Date Noted  . Alcohol abuse 01/23/2014    No past surgical history on file.     Home Medications    Prior to Admission medications   Not on File    Family History No family history on file.  Social History Social History  Substance Use Topics  . Smoking status: Current Every Day Smoker    Types: Cigarettes  . Smokeless tobacco: Not on file  . Alcohol use Yes     Allergies   Patient has no known allergies.   Review of Systems Review of Systems  Constitutional: Negative for fever.  HENT: Negative for sore throat.   Eyes: Negative for discharge.  Gastrointestinal: Negative for rectal pain.  Genitourinary: Positive for discharge and dysuria. Negative for frequency, genital sores, penile pain and testicular pain.  Musculoskeletal: Negative for arthralgias.  Skin: Negative for rash.  Hematological: Negative for adenopathy.     Physical Exam Updated Vital Signs BP 126/87 (BP Location: Right Arm)   Pulse 84   Temp 98.2 F (36.8 C) (Oral)   Resp 20   Ht 5\' 7"  (1.702 m)   Wt 59 kg   SpO2 99%   BMI 20.36 kg/m   Physical Exam    Constitutional: He appears well-developed and well-nourished.  HENT:  Head: Normocephalic and atraumatic.  Eyes: Conjunctivae are normal. Right eye exhibits no discharge. Left eye exhibits no discharge.  Neck: Normal range of motion. Neck supple.  Cardiovascular: Normal rate, regular rhythm and normal heart sounds.   Pulmonary/Chest: Effort normal and breath sounds normal.  Abdominal: Soft. There is no tenderness.  Genitourinary: Testes normal. Right testis shows no swelling and no tenderness. Left testis shows no swelling and no tenderness. Circumcised. Discharge (thick yellow) found.  Lymphadenopathy: Inguinal adenopathy noted on the right and left side.  Neurological: He is alert.  Skin: Skin is warm and dry.  Psychiatric: He has a normal mood and affect.  Nursing note and vitals reviewed.    ED Treatments / Results  Labs (all labs ordered are listed, but only abnormal results are displayed) Labs Reviewed  RPR  HIV ANTIBODY (ROUTINE TESTING)  GC/CHLAMYDIA PROBE AMP ([place]) NOT AT [facility]   Procedures Procedures (including critical care time)  Medications Ordered in ED Medications  lidocaine (PF) (XYLOCAINE) 1 % injection (not administered)  cefTRIAXone (ROCEPHIN) injection 250 mg (250 mg Intramuscular Given 01/21/16 0807)  azithromycin (ZITHROMAX) tablet 1,000 mg (1,000 mg Oral Given 01/21/16 0807)     Initial Impression / Assessment and Plan / ED Course  I have reviewed the triage vital signs and the nursing notes.  Pertinent labs & imaging results that were available during my care of the patient were reviewed by me and considered in my medical decision making (see chart for details).  Clinical Course    Patient seen and examined. Will treat and treat for GC/chlamydia, test for HIV/syphilis.    Vital signs reviewed and are as follows: BP 126/87 (BP Location: Right Arm)   Pulse 84   Temp 98.2 F (36.8 C) (Oral)   Resp 20   Ht 5\' 7"  (1.702 m)   Wt 59 kg    SpO2 99%   BMI 20.36 kg/m   Patient counseled on safe sexual practices. Told them that they should not have sexual contact for next 7 days and that they need to inform sexual partners so that they can get tested and treated as well. Patient verbalizes understanding and agrees with plan.     Final Clinical Impressions(s) / ED Diagnoses   Final diagnoses:  Urethritis   Patient with urethritis. Tested and treated as above.  New Prescriptions New Prescriptions   No medications on file     Renne CriglerJoshua Isair Inabinet, PA-C 01/21/16 0813    Laurence Spatesachel Morgan Little, MD 01/21/16 [phone]

## 2016-01-21 NOTE — ED Triage Notes (Signed)
Patient comes in with c/o penile discharge since Sat. Patient has had 3 sexual partners recently. No other complaints.

## 2016-01-23 LAB — GC/CHLAMYDIA PROBE AMP (~~LOC~~) NOT AT ARMC
CHLAMYDIA, DNA PROBE: NEGATIVE
NEISSERIA GONORRHEA: POSITIVE — AB

## 2016-01-28 ENCOUNTER — Emergency Department (HOSPITAL_COMMUNITY): Payer: Self-pay

## 2016-01-28 ENCOUNTER — Encounter (HOSPITAL_COMMUNITY): Payer: Self-pay | Admitting: Emergency Medicine

## 2016-01-28 ENCOUNTER — Emergency Department (HOSPITAL_COMMUNITY)
Admission: EM | Admit: 2016-01-28 | Discharge: 2016-01-28 | Disposition: A | Discharge status: Home / Self Care | Payer: Self-pay | Attending: Emergency Medicine | Admitting: Emergency Medicine

## 2016-01-28 DIAGNOSIS — Y999 Unspecified external cause status: Secondary | ICD-10-CM | POA: Insufficient documentation

## 2016-01-28 DIAGNOSIS — S0232XA Fracture of orbital floor, left side, initial encounter for closed fracture: Secondary | ICD-10-CM | POA: Insufficient documentation

## 2016-01-28 DIAGNOSIS — F17210 Nicotine dependence, cigarettes, uncomplicated: Secondary | ICD-10-CM | POA: Insufficient documentation

## 2016-01-28 DIAGNOSIS — R220 Localized swelling, mass and lump, head: Secondary | ICD-10-CM | POA: Insufficient documentation

## 2016-01-28 DIAGNOSIS — Y929 Unspecified place or not applicable: Secondary | ICD-10-CM | POA: Insufficient documentation

## 2016-01-28 DIAGNOSIS — Y939 Activity, unspecified: Secondary | ICD-10-CM | POA: Insufficient documentation

## 2016-01-28 LAB — CBC WITH DIFFERENTIAL/PLATELET
Basophils Absolute: 0 10*3/uL (ref 0.0–0.1)
Basophils Relative: 1 %
Eosinophils Absolute: 0 10*3/uL (ref 0.0–0.7)
Eosinophils Relative: 1 %
HCT: 40.5 % (ref 39.0–52.0)
Hemoglobin: 14 g/dL (ref 13.0–17.0)
LYMPHS ABS: 2.4 10*3/uL (ref 0.7–4.0)
LYMPHS PCT: 48 %
MCH: 31.7 pg (ref 26.0–34.0)
MCHC: 34.6 g/dL (ref 30.0–36.0)
MCV: 91.8 fL (ref 78.0–100.0)
MONO ABS: 0.5 10*3/uL (ref 0.1–1.0)
Monocytes Relative: 9 %
Neutro Abs: 2.1 10*3/uL (ref 1.7–7.7)
Neutrophils Relative %: 41 %
PLATELETS: 254 10*3/uL (ref 150–400)
RBC: 4.41 MIL/uL (ref 4.22–5.81)
RDW: 13.7 % (ref 11.5–15.5)
WBC: 5 10*3/uL (ref 4.0–10.5)

## 2016-01-28 LAB — BASIC METABOLIC PANEL
Anion gap: 11 (ref 5–15)
BUN: 6 mg/dL (ref 6–20)
CO2: 22 mmol/L (ref 22–32)
Calcium: 8.8 mg/dL — ABNORMAL LOW (ref 8.9–10.3)
Chloride: 103 mmol/L (ref 101–111)
Creatinine, Ser: 0.77 mg/dL (ref 0.61–1.24)
GFR calc Af Amer: >60 mL/min (ref >60)
GLUCOSE: 78 mg/dL (ref 65–99)
POTASSIUM: 3.7 mmol/L (ref 3.5–5.1)
Sodium: 136 mmol/L (ref 135–145)

## 2016-01-28 LAB — ETHANOL: Alcohol, Ethyl (B): 350 mg/dL (ref <5)

## 2016-01-28 MED ORDER — CEPHALEXIN 500 MG PO CAPS: 500.0000 mg | ORAL_CAPSULE | Freq: Four times a day (QID) | ORAL | 0 refills | Status: AC
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
## 2016-01-28 NOTE — ED Notes (Signed)
Patient to CT.
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
## 2016-01-28 NOTE — ED Notes (Signed)
Dr Ladona RidgelGaddy removed c collar

## 2016-01-28 NOTE — ED Notes (Signed)
Dr Ladona RidgelGaddy replaced c collar after reassessing patient

## 2016-01-28 NOTE — ED Notes (Signed)
Patient ambulated to the BR without difficulty,  Patient alert and oriented.

## 2016-01-28 NOTE — ED Notes (Signed)
Patient back from CT and xray
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
## 2016-01-28 NOTE — ED Notes (Signed)
Discharge instructions reviewed voiced understanding.  Ice pack given.  Sister understands instructions as well

## 2016-01-28 NOTE — Progress Notes (Signed)
   01/28/16 2040  Clinical Encounter Type  Visited With Patient  Visit Type ED  Referral From Other (Comment) (Level 2)  Spiritual Encounters  Spiritual Needs Emotional  Stress Factors  Patient Stress Factors Health changes  Asked Pt about contacting family. Pt gave number of sister. Left message with family member. Pt. Appreciative.

## 2016-01-28 NOTE — ED Notes (Signed)
Family at bedside. 

## 2016-03-05 ENCOUNTER — Encounter (HOSPITAL_COMMUNITY): Payer: Self-pay

## 2016-03-05 ENCOUNTER — Emergency Department (HOSPITAL_COMMUNITY)
Admission: EM | Admit: 2016-03-05 | Discharge: 2016-03-06 | Disposition: A | Discharge status: Home / Self Care | Payer: Self-pay | Attending: Emergency Medicine | Admitting: Emergency Medicine

## 2016-03-05 DIAGNOSIS — F17210 Nicotine dependence, cigarettes, uncomplicated: Secondary | ICD-10-CM | POA: Insufficient documentation

## 2016-03-05 DIAGNOSIS — Z5181 Encounter for therapeutic drug level monitoring: Secondary | ICD-10-CM | POA: Insufficient documentation

## 2016-03-05 DIAGNOSIS — F10929 Alcohol use, unspecified with intoxication, unspecified: Secondary | ICD-10-CM

## 2016-03-05 DIAGNOSIS — F10120 Alcohol abuse with intoxication, uncomplicated: Secondary | ICD-10-CM | POA: Insufficient documentation

## 2016-03-05 LAB — COMPREHENSIVE METABOLIC PANEL
ALK PHOS: 42 U/L (ref 38–126)
ALT: 19 U/L (ref 17–63)
ANION GAP: 8 (ref 5–15)
AST: 23 U/L (ref 15–41)
Albumin: 4.7 g/dL (ref 3.5–5.0)
BILIRUBIN TOTAL: 0.3 mg/dL (ref 0.3–1.2)
BUN: <5 mg/dL — ABNORMAL LOW (ref 6–20)
CALCIUM: 9.4 mg/dL (ref 8.9–10.3)
CO2: 27 mmol/L (ref 22–32)
Chloride: 108 mmol/L (ref 101–111)
Creatinine, Ser: 0.94 mg/dL (ref 0.61–1.24)
GFR calc non Af Amer: >60 mL/min (ref >60)
Glucose, Bld: 106 mg/dL — ABNORMAL HIGH (ref 65–99)
Potassium: 4.5 mmol/L (ref 3.5–5.1)
Sodium: 143 mmol/L (ref 135–145)
TOTAL PROTEIN: 7.7 g/dL (ref 6.5–8.1)

## 2016-03-05 LAB — RAPID URINE DRUG SCREEN, HOSP PERFORMED
AMPHETAMINES: NOT DETECTED
BENZODIAZEPINES: NOT DETECTED
Barbiturates: NOT DETECTED
Cocaine: NOT DETECTED
OPIATES: NOT DETECTED
Tetrahydrocannabinol: NOT DETECTED

## 2016-03-05 LAB — CBC
HCT: 44.1 % (ref 39.0–52.0)
Hemoglobin: 15.1 g/dL (ref 13.0–17.0)
MCH: 31.9 pg (ref 26.0–34.0)
MCHC: 34.2 g/dL (ref 30.0–36.0)
MCV: 93 fL (ref 78.0–100.0)
Platelets: 221 10*3/uL (ref 150–400)
RBC: 4.74 MIL/uL (ref 4.22–5.81)
RDW: 13.3 % (ref 11.5–15.5)
WBC: 3.1 10*3/uL — AB (ref 4.0–10.5)

## 2016-03-05 LAB — ETHANOL: Alcohol, Ethyl (B): 423 mg/dL (ref <5)

## 2016-03-05 LAB — LIPASE, BLOOD: Lipase: 27 U/L (ref 11–51)

## 2016-03-05 MED ORDER — GI COCKTAIL ~~LOC~~
30.0000 mL | Freq: Once | ORAL | Status: AC
  Administered 2016-03-05: 30 mL via ORAL
  Filled 2016-03-05: qty 30

## 2016-03-05 MED ORDER — SODIUM CHLORIDE 0.9 % IV BOLUS (SEPSIS)
1000.0000 mL | Freq: Once | INTRAVENOUS | Status: AC
  Administered 2016-03-05: 1000 mL via INTRAVENOUS

## 2016-03-05 MED ORDER — ONDANSETRON HCL 4 MG/2ML IJ SOLN
4.0000 mg | Freq: Once | INTRAMUSCULAR | Status: AC
  Administered 2016-03-05: 4 mg via INTRAVENOUS
  Filled 2016-03-05: qty 2

## 2016-03-05 MED ORDER — SODIUM CHLORIDE 0.9 % IV BOLUS (SEPSIS)
2000.0000 mL | Freq: Once | INTRAVENOUS | Status: AC
  Administered 2016-03-05: 2000 mL via INTRAVENOUS
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
## 2016-03-05 NOTE — ED Notes (Signed)
Took critical value from [place]. Alcohol level 423
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
## 2016-03-06 NOTE — ED Notes (Addendum)
Patient sleeping at present.  

## 2016-03-06 NOTE — ED Notes (Signed)
Patient starting to wake up , no complaints at present.

## 2016-03-06 NOTE — ED Notes (Signed)
Patient sleeping

## 2016-03-06 NOTE — ED Notes (Signed)
Breakfast tray ordered: regular. 

## 2016-03-06 NOTE — ED Notes (Signed)
Continues to wake patient on hourly rounds

## 2016-03-06 NOTE — ED Notes (Signed)
Patient was given a snack and drink. 

## 2019-06-05 ENCOUNTER — Emergency Department (HOSPITAL_COMMUNITY)
Admission: EM | Admit: 2019-06-05 | Discharge: 2019-06-06 | Disposition: A | Discharge status: Home / Self Care | Payer: Self-pay | Attending: Emergency Medicine | Admitting: Emergency Medicine

## 2019-06-05 ENCOUNTER — Other Ambulatory Visit: Payer: Self-pay

## 2019-06-05 DIAGNOSIS — Z5321 Procedure and treatment not carried out due to patient leaving prior to being seen by health care provider: Secondary | ICD-10-CM | POA: Insufficient documentation

## 2019-06-05 DIAGNOSIS — F10920 Alcohol use, unspecified with intoxication, uncomplicated: Secondary | ICD-10-CM | POA: Insufficient documentation
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
## 2020-04-14 ENCOUNTER — Emergency Department (HOSPITAL_COMMUNITY): Payer: Self-pay

## 2020-04-14 ENCOUNTER — Emergency Department (HOSPITAL_COMMUNITY)
Admission: EM | Admit: 2020-04-14 | Discharge: 2020-04-14 | Disposition: A | Discharge status: Home / Self Care | Payer: Self-pay | Attending: Emergency Medicine | Admitting: Emergency Medicine

## 2020-04-14 ENCOUNTER — Other Ambulatory Visit: Payer: Self-pay

## 2020-04-14 ENCOUNTER — Encounter (HOSPITAL_COMMUNITY): Payer: Self-pay

## 2020-04-14 DIAGNOSIS — F10129 Alcohol abuse with intoxication, unspecified: Secondary | ICD-10-CM | POA: Insufficient documentation

## 2020-04-14 DIAGNOSIS — S0081XA Abrasion of other part of head, initial encounter: Secondary | ICD-10-CM | POA: Insufficient documentation

## 2020-04-14 DIAGNOSIS — Y92524 Gas station as the place of occurrence of the external cause: Secondary | ICD-10-CM | POA: Insufficient documentation

## 2020-04-14 DIAGNOSIS — F17210 Nicotine dependence, cigarettes, uncomplicated: Secondary | ICD-10-CM | POA: Insufficient documentation

## 2020-04-14 DIAGNOSIS — S60211A Contusion of right wrist, initial encounter: Secondary | ICD-10-CM | POA: Insufficient documentation

## 2020-04-14 DIAGNOSIS — S0990XA Unspecified injury of head, initial encounter: Secondary | ICD-10-CM | POA: Insufficient documentation

## 2020-04-14 DIAGNOSIS — Y908 Blood alcohol level of 240 mg/100 ml or more: Secondary | ICD-10-CM | POA: Insufficient documentation

## 2020-04-14 LAB — CBC
HCT: 48.8 % (ref 39.0–52.0)
Hemoglobin: 15.1 g/dL (ref 13.0–17.0)
MCH: 31.6 pg (ref 26.0–34.0)
MCHC: 30.9 g/dL (ref 30.0–36.0)
MCV: 102.1 fL — ABNORMAL HIGH (ref 80.0–100.0)
Platelets: 318 10*3/uL (ref 150–400)
RBC: 4.78 MIL/uL (ref 4.22–5.81)
RDW: 13.4 % (ref 11.5–15.5)
WBC: 3.2 10*3/uL — ABNORMAL LOW (ref 4.0–10.5)
nRBC: 0 % (ref 0.0–0.2)

## 2020-04-14 LAB — COMPREHENSIVE METABOLIC PANEL
ALT: 21 U/L (ref 0–44)
AST: 30 U/L (ref 15–41)
Albumin: 4 g/dL (ref 3.5–5.0)
Alkaline Phosphatase: 65 U/L (ref 38–126)
Anion gap: 12 (ref 5–15)
BUN: 8 mg/dL (ref 6–20)
CO2: 22 mmol/L (ref 22–32)
Calcium: 8.5 mg/dL — ABNORMAL LOW (ref 8.9–10.3)
Chloride: 106 mmol/L (ref 98–111)
Creatinine, Ser: 0.81 mg/dL (ref 0.61–1.24)
GFR, Estimated: >60 mL/min (ref >60)
Glucose, Bld: 83 mg/dL (ref 70–99)
Potassium: 4 mmol/L (ref 3.5–5.1)
Sodium: 140 mmol/L (ref 135–145)
Total Bilirubin: 0.4 mg/dL (ref 0.3–1.2)
Total Protein: 7.1 g/dL (ref 6.5–8.1)

## 2020-04-14 LAB — ETHANOL: Alcohol, Ethyl (B): 340 mg/dL (ref <10)

## 2020-04-14 MED ORDER — ONDANSETRON 4 MG PO TBDP: 4.0000 mg | ORAL_TABLET | Freq: Three times a day (TID) | ORAL | 0 refills | Status: DC | PRN

## 2020-04-14 MED ORDER — METHOCARBAMOL 500 MG PO TABS: 500.0000 mg | ORAL_TABLET | Freq: Two times a day (BID) | ORAL | 0 refills | Status: DC

## 2020-04-14 MED ORDER — ACETAMINOPHEN 325 MG PO TABS
650.0000 mg | ORAL_TABLET | Freq: Once | ORAL | Status: AC
  Administered 2020-04-14: 650 mg via ORAL
  Filled 2020-04-14: qty 2

## 2020-04-14 MED ORDER — IBUPROFEN 200 MG PO TABS
600.0000 mg | ORAL_TABLET | Freq: Once | ORAL | Status: AC
  Administered 2020-04-14: 600 mg via ORAL
  Filled 2020-04-14: qty 1

## 2020-04-14 MED ORDER — BACITRACIN ZINC 500 UNIT/GM EX OINT
TOPICAL_OINTMENT | CUTANEOUS | Status: AC
  Administered 2020-04-14: 1 via TOPICAL
  Filled 2020-04-14: qty 0.9

## 2020-04-14 NOTE — ED Notes (Signed)
Dressing applied at this time.
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
## 2020-04-14 NOTE — Progress Notes (Signed)
Orthopedic Tech Progress Note Patient Details:  HRISTOPHER MISSILDINE 11/16/1980 [id number]  Ortho Devices Type of Ortho Device: Thumb velcro splint Ortho Device/Splint Location: RUE Ortho Device/Splint Interventions: Ordered,Application,Adjustment   Post Interventions Patient Tolerated: Fair Instructions Provided: Care of device,Adjustment of device,Poper ambulation with device   Kalyssa Anker 04/14/2020, 1:21 PM
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
## 2020-08-11 ENCOUNTER — Observation Stay (HOSPITAL_COMMUNITY): Payer: Self-pay

## 2020-08-11 ENCOUNTER — Observation Stay (HOSPITAL_COMMUNITY): Payer: Self-pay | Admitting: Certified Registered Nurse Anesthetist

## 2020-08-11 ENCOUNTER — Inpatient Hospital Stay (HOSPITAL_COMMUNITY)
Admission: EM | Admit: 2020-08-11 | Discharge: 2020-08-21 | DRG: 982 | Disposition: A | Discharge status: Rehabilitation Facility | Payer: Self-pay | Attending: Student | Admitting: Student

## 2020-08-11 ENCOUNTER — Emergency Department (HOSPITAL_COMMUNITY): Payer: Self-pay

## 2020-08-11 ENCOUNTER — Encounter (HOSPITAL_COMMUNITY): Admission: EM | Disposition: A | Discharge status: Rehabilitation Facility | Payer: Self-pay | Source: Home / Self Care

## 2020-08-11 ENCOUNTER — Encounter (HOSPITAL_COMMUNITY): Payer: Self-pay

## 2020-08-11 DIAGNOSIS — S329XXA Fracture of unspecified parts of lumbosacral spine and pelvis, initial encounter for closed fracture: Secondary | ICD-10-CM

## 2020-08-11 DIAGNOSIS — R52 Pain, unspecified: Secondary | ICD-10-CM

## 2020-08-11 DIAGNOSIS — T1490XA Injury, unspecified, initial encounter: Secondary | ICD-10-CM

## 2020-08-11 DIAGNOSIS — S01112A Laceration without foreign body of left eyelid and periocular area, initial encounter: Principal | ICD-10-CM | POA: Diagnosis present

## 2020-08-11 DIAGNOSIS — S0240DA Maxillary fracture, left side, initial encounter for closed fracture: Secondary | ICD-10-CM | POA: Diagnosis present

## 2020-08-11 DIAGNOSIS — Z20822 Contact with and (suspected) exposure to covid-19: Secondary | ICD-10-CM | POA: Diagnosis present

## 2020-08-11 DIAGNOSIS — S51012A Laceration without foreign body of left elbow, initial encounter: Secondary | ICD-10-CM | POA: Diagnosis present

## 2020-08-11 DIAGNOSIS — S32811A Multiple fractures of pelvis with unstable disruption of pelvic ring, initial encounter for closed fracture: Secondary | ICD-10-CM | POA: Diagnosis present

## 2020-08-11 DIAGNOSIS — S32119A Unspecified Zone I fracture of sacrum, initial encounter for closed fracture: Secondary | ICD-10-CM | POA: Diagnosis present

## 2020-08-11 DIAGNOSIS — Z23 Encounter for immunization: Secondary | ICD-10-CM

## 2020-08-11 DIAGNOSIS — S42032A Displaced fracture of lateral end of left clavicle, initial encounter for closed fracture: Secondary | ICD-10-CM | POA: Diagnosis present

## 2020-08-11 DIAGNOSIS — S0232XA Fracture of orbital floor, left side, initial encounter for closed fracture: Secondary | ICD-10-CM | POA: Diagnosis present

## 2020-08-11 DIAGNOSIS — S61412A Laceration without foreign body of left hand, initial encounter: Secondary | ICD-10-CM | POA: Diagnosis present

## 2020-08-11 HISTORY — PX: ORIF PELVIC FRACTURE: SHX2128

## 2020-08-11 LAB — I-STAT CHEM 8, ED
BUN: 10 mg/dL (ref 6–20)
Calcium, Ion: 0.95 mmol/L — ABNORMAL LOW (ref 1.15–1.40)
Chloride: 105 mmol/L (ref 98–111)
Creatinine, Ser: 1.1 mg/dL (ref 0.61–1.24)
Glucose, Bld: 126 mg/dL — ABNORMAL HIGH (ref 70–99)
HCT: 44 % (ref 39.0–52.0)
Hemoglobin: 15 g/dL (ref 13.0–17.0)
Potassium: 5.4 mmol/L — ABNORMAL HIGH (ref 3.5–5.1)
Sodium: 136 mmol/L (ref 135–145)
TCO2: 20 mmol/L — ABNORMAL LOW (ref 22–32)

## 2020-08-11 LAB — CBC
HCT: 41.7 % (ref 39.0–52.0)
HCT: 36.6 % — ABNORMAL LOW (ref 39.0–52.0)
Hemoglobin: 14.3 g/dL (ref 13.0–17.0)
Hemoglobin: 12.3 g/dL — ABNORMAL LOW (ref 13.0–17.0)
MCH: 32.1 pg (ref 26.0–34.0)
MCH: 32.3 pg (ref 26.0–34.0)
MCHC: 34.3 g/dL (ref 30.0–36.0)
MCHC: 33.6 g/dL (ref 30.0–36.0)
MCV: 93.5 fL (ref 80.0–100.0)
MCV: 96.1 fL (ref 80.0–100.0)
Platelets: 283 10*3/uL (ref 150–400)
Platelets: 244 10*3/uL (ref 150–400)
RBC: 4.46 MIL/uL (ref 4.22–5.81)
RBC: 3.81 MIL/uL — ABNORMAL LOW (ref 4.22–5.81)
RDW: 13.3 % (ref 11.5–15.5)
RDW: 13.4 % (ref 11.5–15.5)
WBC: 3.3 10*3/uL — ABNORMAL LOW (ref 4.0–10.5)
WBC: 8.2 10*3/uL (ref 4.0–10.5)
nRBC: 0 % (ref 0.0–0.2)
nRBC: 0 % (ref 0.0–0.2)

## 2020-08-11 LAB — URINALYSIS, ROUTINE W REFLEX MICROSCOPIC
Bacteria, UA: NONE SEEN
Bilirubin Urine: NEGATIVE
Glucose, UA: NEGATIVE mg/dL
Ketones, ur: NEGATIVE mg/dL
Leukocytes,Ua: NEGATIVE
Nitrite: NEGATIVE
Protein, ur: 30 mg/dL — AB
Specific Gravity, Urine: 1.016 (ref 1.005–1.030)
pH: 7 (ref 5.0–8.0)

## 2020-08-11 LAB — COMPREHENSIVE METABOLIC PANEL
ALT: 22 U/L (ref 0–44)
AST: 38 U/L (ref 15–41)
Albumin: 4.1 g/dL (ref 3.5–5.0)
Alkaline Phosphatase: 48 U/L (ref 38–126)
Anion gap: 12 (ref 5–15)
BUN: 8 mg/dL (ref 6–20)
CO2: 19 mmol/L — ABNORMAL LOW (ref 22–32)
Calcium: 8.8 mg/dL — ABNORMAL LOW (ref 8.9–10.3)
Chloride: 104 mmol/L (ref 98–111)
Creatinine, Ser: 1.01 mg/dL (ref 0.61–1.24)
GFR, Estimated: >60 mL/min (ref >60)
Glucose, Bld: 125 mg/dL — ABNORMAL HIGH (ref 70–99)
Potassium: 4.3 mmol/L (ref 3.5–5.1)
Sodium: 135 mmol/L (ref 135–145)
Total Bilirubin: 0.4 mg/dL (ref 0.3–1.2)
Total Protein: 6.9 g/dL (ref 6.5–8.1)

## 2020-08-11 LAB — BASIC METABOLIC PANEL
Anion gap: 7 (ref 5–15)
BUN: 5 mg/dL — ABNORMAL LOW (ref 6–20)
CO2: 19 mmol/L — ABNORMAL LOW (ref 22–32)
Calcium: 7.4 mg/dL — ABNORMAL LOW (ref 8.9–10.3)
Chloride: 109 mmol/L (ref 98–111)
Creatinine, Ser: 0.83 mg/dL (ref 0.61–1.24)
GFR, Estimated: >60 mL/min (ref >60)
Glucose, Bld: 107 mg/dL — ABNORMAL HIGH (ref 70–99)
Potassium: 3.5 mmol/L (ref 3.5–5.1)
Sodium: 135 mmol/L (ref 135–145)

## 2020-08-11 LAB — PROTIME-INR
INR: 1 (ref 0.8–1.2)
Prothrombin Time: 12.7 seconds (ref 11.4–15.2)

## 2020-08-11 LAB — RESP PANEL BY RT-PCR (FLU A&B, COVID) ARPGX2
Influenza A by PCR: NEGATIVE
Influenza B by PCR: NEGATIVE
SARS Coronavirus 2 by RT PCR: NEGATIVE

## 2020-08-11 LAB — LACTIC ACID, PLASMA: Lactic Acid, Venous: 2.4 mmol/L (ref 0.5–1.9)

## 2020-08-11 LAB — ETHANOL: Alcohol, Ethyl (B): 256 mg/dL — ABNORMAL HIGH (ref <10)

## 2020-08-11 SURGERY — OPEN REDUCTION INTERNAL FIXATION (ORIF) PELVIC FRACTURE
Anesthesia: General | Site: Pelvis

## 2020-08-11 MED ORDER — METOCLOPRAMIDE HCL 5 MG PO TABS
5.0000 mg | ORAL_TABLET | Freq: Three times a day (TID) | ORAL | Status: DC | PRN
  Administered 2020-08-11: 5 mg via ORAL
  Filled 2020-08-11: qty 1

## 2020-08-11 MED ORDER — SUGAMMADEX SODIUM 200 MG/2ML IV SOLN
INTRAVENOUS | Status: DC | PRN
  Administered 2020-08-11: 150 mg via INTRAVENOUS

## 2020-08-11 MED ORDER — METHOCARBAMOL 500 MG PO TABS
1000.0000 mg | ORAL_TABLET | Freq: Three times a day (TID) | ORAL | Status: DC
  Administered 2020-08-11 – 2020-08-14 (×12): 1000 mg via ORAL
  Filled 2020-08-11 (×12): qty 2

## 2020-08-11 MED ORDER — SODIUM CHLORIDE 0.9 % IV SOLN: INTRAVENOUS | Status: DC

## 2020-08-11 MED ORDER — OXYCODONE HCL 5 MG PO TABS
5.0000 mg | ORAL_TABLET | ORAL | Status: DC | PRN
  Administered 2020-08-11: 5 mg via ORAL
  Filled 2020-08-11: qty 1

## 2020-08-11 MED ORDER — ONDANSETRON HCL 4 MG PO TABS: 4.0000 mg | ORAL_TABLET | Freq: Four times a day (QID) | ORAL | Status: DC | PRN

## 2020-08-11 MED ORDER — ENOXAPARIN SODIUM 40 MG/0.4ML IJ SOSY
40.0000 mg | PREFILLED_SYRINGE | INTRAMUSCULAR | Status: DC
  Administered 2020-08-12 – 2020-08-21 (×10): 40 mg via SUBCUTANEOUS
  Filled 2020-08-11 (×10): qty 0.4

## 2020-08-11 MED ORDER — LIDOCAINE 2% (20 MG/ML) 5 ML SYRINGE
INTRAMUSCULAR | Status: DC | PRN
  Administered 2020-08-11: 60 mg via INTRAVENOUS

## 2020-08-11 MED ORDER — SODIUM CHLORIDE 0.9 % IV BOLUS
1000.0000 mL | Freq: Once | INTRAVENOUS | Status: AC
  Administered 2020-08-11: 1000 mL via INTRAVENOUS

## 2020-08-11 MED ORDER — ACETAMINOPHEN 325 MG PO TABS
650.0000 mg | ORAL_TABLET | Freq: Four times a day (QID) | ORAL | Status: DC
  Administered 2020-08-11: 650 mg via ORAL
  Filled 2020-08-11: qty 2

## 2020-08-11 MED ORDER — 0.9 % SODIUM CHLORIDE (POUR BTL) OPTIME
TOPICAL | Status: DC | PRN
  Administered 2020-08-11: 1000 mL

## 2020-08-11 MED ORDER — HYDROMORPHONE HCL 1 MG/ML IJ SOLN
0.5000 mg | INTRAMUSCULAR | Status: DC | PRN
  Administered 2020-08-11 – 2020-08-14 (×6): 1 mg via INTRAVENOUS
  Filled 2020-08-11 (×6): qty 1

## 2020-08-11 MED ORDER — SUCCINYLCHOLINE CHLORIDE 200 MG/10ML IV SOSY
PREFILLED_SYRINGE | INTRAVENOUS | Status: DC | PRN
  Administered 2020-08-11: 120 mg via INTRAVENOUS

## 2020-08-11 MED ORDER — CHLORHEXIDINE GLUCONATE 0.12 % MT SOLN: 15.0000 mL | Freq: Once | OROMUCOSAL | Status: AC

## 2020-08-11 MED ORDER — OXYCODONE HCL 5 MG/5ML PO SOLN
5.0000 mg | Freq: Once | ORAL | Status: DC | PRN
Start: 2020-08-11 — End: 2020-08-11

## 2020-08-11 MED ORDER — OXYCODONE HCL 5 MG PO TABS: 5.0000 mg | ORAL_TABLET | ORAL | Status: DC | PRN

## 2020-08-11 MED ORDER — CEFAZOLIN SODIUM-DEXTROSE 2-3 GM-%(50ML) IV SOLR
INTRAVENOUS | Status: DC | PRN
  Administered 2020-08-11: 2 g via INTRAVENOUS

## 2020-08-11 MED ORDER — PROMETHAZINE HCL 25 MG/ML IJ SOLN: 6.2500 mg | INTRAMUSCULAR | Status: DC | PRN

## 2020-08-11 MED ORDER — METOCLOPRAMIDE HCL 5 MG/ML IJ SOLN: 5.0000 mg | Freq: Three times a day (TID) | INTRAMUSCULAR | Status: DC | PRN

## 2020-08-11 MED ORDER — ROCURONIUM BROMIDE 10 MG/ML (PF) SYRINGE
PREFILLED_SYRINGE | INTRAVENOUS | Status: DC | PRN
  Administered 2020-08-11: 50 mg via INTRAVENOUS

## 2020-08-11 MED ORDER — MIDAZOLAM HCL 2 MG/2ML IJ SOLN
INTRAMUSCULAR | Status: DC | PRN
  Administered 2020-08-11 (×2): 1 mg via INTRAVENOUS

## 2020-08-11 MED ORDER — VANCOMYCIN HCL 1000 MG IV SOLR
INTRAVENOUS | Status: AC
  Filled 2020-08-11: qty 1000

## 2020-08-11 MED ORDER — ONDANSETRON HCL 4 MG/2ML IJ SOLN: 4.0000 mg | Freq: Four times a day (QID) | INTRAMUSCULAR | Status: DC | PRN

## 2020-08-11 MED ORDER — HYDROMORPHONE HCL 1 MG/ML IJ SOLN
0.5000 mg | Freq: Once | INTRAMUSCULAR | Status: AC
  Administered 2020-08-11: 0.5 mg via INTRAVENOUS
  Filled 2020-08-11: qty 1

## 2020-08-11 MED ORDER — OXYCODONE HCL 5 MG PO TABS: 5.0000 mg | ORAL_TABLET | Freq: Once | ORAL | Status: DC | PRN

## 2020-08-11 MED ORDER — FENTANYL CITRATE (PF) 100 MCG/2ML IJ SOLN
50.0000 ug | Freq: Once | INTRAMUSCULAR | Status: AC
  Administered 2020-08-11: 50 ug via INTRAVENOUS

## 2020-08-11 MED ORDER — OXYCODONE HCL 5 MG PO TABS
5.0000 mg | ORAL_TABLET | ORAL | Status: DC | PRN
Start: 2020-08-11 — End: 2020-08-11
  Administered 2020-08-11: 10 mg via ORAL
  Filled 2020-08-11: qty 2

## 2020-08-11 MED ORDER — ONDANSETRON HCL 4 MG/2ML IJ SOLN
4.0000 mg | Freq: Four times a day (QID) | INTRAMUSCULAR | Status: DC | PRN
  Administered 2020-08-11: 4 mg via INTRAVENOUS
  Filled 2020-08-11: qty 2

## 2020-08-11 MED ORDER — CEFAZOLIN SODIUM-DEXTROSE 2-4 GM/100ML-% IV SOLN
INTRAVENOUS | Status: AC
  Filled 2020-08-11: qty 100

## 2020-08-11 MED ORDER — OXYCODONE HCL 5 MG PO TABS
10.0000 mg | ORAL_TABLET | ORAL | Status: DC | PRN
  Administered 2020-08-11 – 2020-08-19 (×32): 15 mg via ORAL
  Filled 2020-08-11 (×33): qty 3

## 2020-08-11 MED ORDER — HYDROMORPHONE HCL 1 MG/ML IJ SOLN
INTRAMUSCULAR | Status: AC
  Filled 2020-08-11: qty 1

## 2020-08-11 MED ORDER — LACTATED RINGERS IV SOLN: INTRAVENOUS | Status: DC | PRN

## 2020-08-11 MED ORDER — TRANEXAMIC ACID-NACL 1000-0.7 MG/100ML-% IV SOLN
1000.0000 mg | Freq: Once | INTRAVENOUS | Status: AC
  Administered 2020-08-11: 1000 mg via INTRAVENOUS
  Filled 2020-08-11: qty 100

## 2020-08-11 MED ORDER — DEXAMETHASONE SODIUM PHOSPHATE 10 MG/ML IJ SOLN
INTRAMUSCULAR | Status: DC | PRN
  Administered 2020-08-11: 10 mg via INTRAVENOUS

## 2020-08-11 MED ORDER — ORAL CARE MOUTH RINSE: 15.0000 mL | Freq: Once | OROMUCOSAL | Status: AC

## 2020-08-11 MED ORDER — HYDROMORPHONE HCL 1 MG/ML IJ SOLN
0.5000 mg | INTRAMUSCULAR | Status: DC | PRN
  Administered 2020-08-11: 1 mg via INTRAVENOUS
  Filled 2020-08-11: qty 1

## 2020-08-11 MED ORDER — CHLORHEXIDINE GLUCONATE 0.12 % MT SOLN
OROMUCOSAL | Status: AC
  Administered 2020-08-11: 15 mL via OROMUCOSAL
  Filled 2020-08-11: qty 15

## 2020-08-11 MED ORDER — IOHEXOL 350 MG/ML SOLN
100.0000 mL | Freq: Once | INTRAVENOUS | Status: AC | PRN
  Administered 2020-08-11: 100 mL via INTRAVENOUS

## 2020-08-11 MED ORDER — VANCOMYCIN HCL 1000 MG IV SOLR
INTRAVENOUS | Status: DC | PRN
  Administered 2020-08-11: 1000 mg via TOPICAL

## 2020-08-11 MED ORDER — ENOXAPARIN SODIUM 30 MG/0.3ML IJ SOSY: 30.0000 mg | PREFILLED_SYRINGE | Freq: Two times a day (BID) | INTRAMUSCULAR | Status: DC

## 2020-08-11 MED ORDER — POLYETHYLENE GLYCOL 3350 17 G PO PACK: 17.0000 g | PACK | Freq: Every day | ORAL | Status: DC | PRN

## 2020-08-11 MED ORDER — PROPOFOL 10 MG/ML IV BOLUS
INTRAVENOUS | Status: DC | PRN
  Administered 2020-08-11: 170 mg via INTRAVENOUS

## 2020-08-11 MED ORDER — PHENYLEPHRINE HCL-NACL 10-0.9 MG/250ML-% IV SOLN
INTRAVENOUS | Status: DC | PRN
  Administered 2020-08-11: 50 ug/min via INTRAVENOUS

## 2020-08-11 MED ORDER — ONDANSETRON HCL 4 MG/2ML IJ SOLN
INTRAMUSCULAR | Status: DC | PRN
  Administered 2020-08-11: 4 mg via INTRAVENOUS

## 2020-08-11 MED ORDER — CEFAZOLIN SODIUM-DEXTROSE 2-4 GM/100ML-% IV SOLN
2.0000 g | Freq: Three times a day (TID) | INTRAVENOUS | Status: AC
  Administered 2020-08-11 – 2020-08-12 (×3): 2 g via INTRAVENOUS
  Filled 2020-08-11 (×3): qty 100

## 2020-08-11 MED ORDER — FENTANYL CITRATE (PF) 100 MCG/2ML IJ SOLN: 25.0000 ug | INTRAMUSCULAR | Status: DC | PRN

## 2020-08-11 MED ORDER — DOCUSATE SODIUM 100 MG PO CAPS
100.0000 mg | ORAL_CAPSULE | Freq: Two times a day (BID) | ORAL | Status: DC
  Administered 2020-08-11 – 2020-08-21 (×17): 100 mg via ORAL
  Filled 2020-08-11 (×18): qty 1

## 2020-08-11 MED ORDER — ACETAMINOPHEN 325 MG PO TABS: 325.0000 mg | ORAL_TABLET | Freq: Four times a day (QID) | ORAL | Status: DC | PRN

## 2020-08-11 MED ORDER — HYDROMORPHONE HCL 1 MG/ML IJ SOLN
0.5000 mg | INTRAMUSCULAR | Status: DC | PRN
  Administered 2020-08-11: 0.5 mg via INTRAVENOUS
  Filled 2020-08-11: qty 0.5

## 2020-08-11 MED ORDER — FENTANYL CITRATE (PF) 100 MCG/2ML IJ SOLN
INTRAMUSCULAR | Status: AC
  Administered 2020-08-11: 25 ug via INTRAVENOUS
  Filled 2020-08-11: qty 2

## 2020-08-11 MED ORDER — TETANUS-DIPHTH-ACELL PERTUSSIS 5-2.5-18.5 LF-MCG/0.5 IM SUSY
0.5000 mL | PREFILLED_SYRINGE | Freq: Once | INTRAMUSCULAR | Status: AC
  Administered 2020-08-11: 0.5 mL via INTRAMUSCULAR

## 2020-08-11 MED ORDER — ACETAMINOPHEN 500 MG PO TABS
1000.0000 mg | ORAL_TABLET | Freq: Four times a day (QID) | ORAL | Status: DC
  Administered 2020-08-11 – 2020-08-21 (×36): 1000 mg via ORAL
  Filled 2020-08-11 (×38): qty 2

## 2020-08-11 MED ORDER — FENTANYL CITRATE (PF) 100 MCG/2ML IJ SOLN
INTRAMUSCULAR | Status: AC
  Filled 2020-08-11: qty 2

## 2020-08-11 MED ORDER — IBUPROFEN 200 MG PO TABS
800.0000 mg | ORAL_TABLET | Freq: Three times a day (TID) | ORAL | Status: DC
  Administered 2020-08-11 – 2020-08-12 (×3): 800 mg via ORAL
  Filled 2020-08-11: qty 1
  Filled 2020-08-11 (×2): qty 4

## 2020-08-11 MED ORDER — METHOCARBAMOL 500 MG PO TABS
500.0000 mg | ORAL_TABLET | Freq: Four times a day (QID) | ORAL | Status: DC | PRN
  Administered 2020-08-11: 500 mg via ORAL
  Filled 2020-08-11: qty 1

## 2020-08-11 MED ORDER — LACTATED RINGERS IV SOLN: INTRAVENOUS | Status: DC

## 2020-08-11 MED ORDER — FENTANYL CITRATE (PF) 250 MCG/5ML IJ SOLN
INTRAMUSCULAR | Status: DC | PRN
  Administered 2020-08-11 (×5): 50 ug via INTRAVENOUS

## 2020-08-11 MED ORDER — HYDROMORPHONE HCL 1 MG/ML IJ SOLN
1.0000 mg | Freq: Once | INTRAMUSCULAR | Status: AC
  Administered 2020-08-11: 1 mg via INTRAVENOUS

## 2020-08-11 SURGICAL SUPPLY — 69 items
ADH SKN CLS APL DERMABOND .7 (GAUZE/BANDAGES/DRESSINGS) ×1
APL PRP STRL LF DISP 70% ISPRP (MISCELLANEOUS) ×1
BIT DRILL 2.5X300 (BIT) ×2 IMPLANT
BIT DRILL CANN 4.5MM (BIT) ×1 IMPLANT
BLADE CLIPPER SURG (BLADE) ×2 IMPLANT
BLADE SURG 11 STRL SS (BLADE) ×2 IMPLANT
CHLORAPREP W/TINT 26 (MISCELLANEOUS) ×2 IMPLANT
COVER SURGICAL LIGHT HANDLE (MISCELLANEOUS) ×2 IMPLANT
COVER WAND RF STERILE (DRAPES) ×2 IMPLANT
DERMABOND ADVANCED (GAUZE/BANDAGES/DRESSINGS) ×1
DERMABOND ADVANCED .7 DNX12 (GAUZE/BANDAGES/DRESSINGS) ×1 IMPLANT
DRAIN CHANNEL 15F RND FF W/TCR (WOUND CARE) IMPLANT
DRAPE C-ARM 42X72 X-RAY (DRAPES) ×2 IMPLANT
DRAPE C-ARMOR (DRAPES) ×2 IMPLANT
DRAPE HALF SHEET 40X57 (DRAPES) IMPLANT
DRAPE INCISE IOBAN 66X45 STRL (DRAPES) ×2 IMPLANT
DRAPE SURG 17X23 STRL (DRAPES) ×12 IMPLANT
DRAPE U-SHAPE 47X51 STRL (DRAPES) IMPLANT
DRESSING MEPILEX FLEX 4X4 (GAUZE/BANDAGES/DRESSINGS) ×1 IMPLANT
DRILL BIT 2.5X300 (BIT) ×4
DRILL BIT CANN 4.5MM (BIT) ×1
DRSG MEPILEX BORDER 4X4 (GAUZE/BANDAGES/DRESSINGS) IMPLANT
DRSG MEPILEX BORDER 4X8 (GAUZE/BANDAGES/DRESSINGS) ×2 IMPLANT
DRSG MEPILEX FLEX 4X4 (GAUZE/BANDAGES/DRESSINGS) ×2
ELECT REM PT RETURN 9FT ADLT (ELECTROSURGICAL) ×2
ELECTRODE REM PT RTRN 9FT ADLT (ELECTROSURGICAL) ×1 IMPLANT
EVACUATOR SILICONE 100CC (DRAIN) IMPLANT
GLOVE BIO SURGEON STRL SZ 6.5 (GLOVE) ×6 IMPLANT
GLOVE BIO SURGEON STRL SZ7.5 (GLOVE) ×8 IMPLANT
GLOVE BIOGEL PI IND STRL 7.5 (GLOVE) ×1 IMPLANT
GLOVE BIOGEL PI INDICATOR 7.5 (GLOVE) ×1
GLOVE SURG UNDER POLY LF SZ6.5 (GLOVE) ×2 IMPLANT
GOWN STRL REUS W/ TWL LRG LVL3 (GOWN DISPOSABLE) ×2 IMPLANT
GOWN STRL REUS W/TWL LRG LVL3 (GOWN DISPOSABLE) ×4
GUIDEWIRE 2.0MM (WIRE) ×6 IMPLANT
GUIDEWIRE THREADED 2.8MM (WIRE) ×2 IMPLANT
KIT BASIN OR (CUSTOM PROCEDURE TRAY) ×2 IMPLANT
KIT TURNOVER KIT B (KITS) ×2 IMPLANT
MANIFOLD NEPTUNE II (INSTRUMENTS) ×2 IMPLANT
NS IRRIG 1000ML POUR BTL (IV SOLUTION) ×2 IMPLANT
PACK TOTAL JOINT (CUSTOM PROCEDURE TRAY) ×2 IMPLANT
PACK UNIVERSAL I (CUSTOM PROCEDURE TRAY) ×2 IMPLANT
PAD ARMBOARD 7.5X6 YLW CONV (MISCELLANEOUS) ×4 IMPLANT
PLATE PUBLIC SYMPHOSIS 3.5 (Plate) ×2 IMPLANT
SCREW CANN 7.3X140MM (Screw) ×2 IMPLANT
SCREW CORTEX 3.5 22MM (Screw) ×2 IMPLANT
SCREW CORTEX 3.5 30MM (Screw) ×1 IMPLANT
SCREW CORTEX 3.5 36MM (Screw) ×2 IMPLANT
SCREW CORTEX 3.5 45MM (Screw) ×2 IMPLANT
SCREW LOCK CORT ST 3.5X22 (Screw) ×2 IMPLANT
SCREW LOCK CORT ST 3.5X30 (Screw) ×1 IMPLANT
SCREW LOCK CORT ST 3.5X36 (Screw) ×2 IMPLANT
SPONGE LAP 18X18 RF (DISPOSABLE) IMPLANT
STAPLER VISISTAT 35W (STAPLE) ×2 IMPLANT
SUCTION FRAZIER HANDLE 10FR (MISCELLANEOUS) ×2
SUCTION TUBE FRAZIER 10FR DISP (MISCELLANEOUS) ×1 IMPLANT
SUT MNCRL AB 3-0 PS2 18 (SUTURE) ×2 IMPLANT
SUT MON AB 2-0 CT1 36 (SUTURE) ×2 IMPLANT
SUT VIC AB 0 CT1 27 (SUTURE) ×4
SUT VIC AB 0 CT1 27XBRD ANBCTR (SUTURE) ×2 IMPLANT
SUT VIC AB 1 CT1 18XCR BRD 8 (SUTURE) IMPLANT
SUT VIC AB 1 CT1 8-18 (SUTURE)
SUT VIC AB 2-0 CT1 27 (SUTURE) ×4
SUT VIC AB 2-0 CT1 TAPERPNT 27 (SUTURE) ×2 IMPLANT
TOWEL GREEN STERILE (TOWEL DISPOSABLE) ×4 IMPLANT
TOWEL GREEN STERILE FF (TOWEL DISPOSABLE) ×2 IMPLANT
TRAY FOLEY MTR SLVR 16FR STAT (SET/KITS/TRAYS/PACK) ×2 IMPLANT
WASHER FOR 5.0 SCREWS (Washer) ×2 IMPLANT
WATER STERILE IRR 1000ML POUR (IV SOLUTION) IMPLANT
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
## 2020-08-11 NOTE — ED Notes (Signed)
The pt has given  A number  Of his brother  [phone] brothers name is michael [email or web address]

## 2020-08-11 NOTE — ED Notes (Signed)
Report given to 5n 
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
## 2020-08-11 NOTE — Anesthesia Postprocedure Evaluation (Signed)
Anesthesia Post Note  Patient: Jake Silva  Procedure(s) Performed: OPEN REDUCTION INTERNAL FIXATION (ORIF) PELVIC FRACTURE (N/A Pelvis)     Patient location during evaluation: PACU Anesthesia Type: General Level of consciousness: awake and alert Pain management: pain level controlled Vital Signs Assessment: post-procedure vital signs reviewed and stable Respiratory status: spontaneous breathing, nonlabored ventilation, respiratory function stable and patient connected to nasal cannula oxygen Cardiovascular status: blood pressure returned to baseline and stable Postop Assessment: no apparent nausea or vomiting Anesthetic complications: no   No complications documented.  Last Vitals:  Vitals:   08/11/20 1821 08/11/20 1849  BP: 105/73 (!) 114/95  Pulse: 62 64  Resp: 12 15  Temp: 36.7 C 36.8 C  SpO2: 95% 100%                 Beryle Lathe
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
## 2020-08-11 NOTE — ED Notes (Signed)
Report given to 5n Nurse 
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
## 2020-08-11 NOTE — ED Notes (Signed)
To ct

## 2020-08-11 NOTE — Anesthesia Procedure Notes (Signed)
Procedure Name: Intubation Date/Time: 08/11/2020 3:32 PM Performed by: Edmonia Caprio, CRNA Pre-anesthesia Checklist: Patient identified, Emergency Drugs available, Suction available, Patient being monitored and Timeout performed Patient Re-evaluated:Patient Re-evaluated prior to induction Oxygen Delivery Method: Circle system utilized Preoxygenation: Pre-oxygenation with 100% oxygen Induction Type: IV induction and Rapid sequence Laryngoscope Size: Glidescope and 3 Grade View: Grade I Tube type: Oral Tube size: 7.5 mm Number of attempts: 1 Airway Equipment and Method: Stylet and Video-laryngoscopy Placement Confirmation: ETT inserted through vocal cords under direct vision,  positive ETCO2 and breath sounds checked- equal and bilateral Secured at: 23 cm Tube secured with: Tape Dental Injury: Teeth and Oropharynx as per pre-operative assessment  Difficulty Due To: Difficult Airway- due to cervical collar Comments: Oropharynx clear on DL.  C-Collarm maintained except for DL; manual in-line stabilization held by Dr Mal Amabile. C-Collar replaced immediately after intubation.

## 2020-08-11 NOTE — Transfer of Care (Signed)
Immediate Anesthesia Transfer of Care Note  Patient: Jake Silva  Procedure(s) Performed: OPEN REDUCTION INTERNAL FIXATION (ORIF) PELVIC FRACTURE (N/A Pelvis)  Patient Location: PACU  Anesthesia Type:General  Level of Consciousness: awake, alert  and oriented  Airway & Oxygen Therapy: Patient Spontanous Breathing and Patient connected to nasal cannula oxygen  Post-op Assessment: Report given to RN and Post -op Vital signs reviewed and stable  Post vital signs: Reviewed and stable  Last Vitals:  Vitals Value Taken Time  BP 130/85 08/11/20 1751  Temp    Pulse 64 08/11/20 1754  Resp 12 08/11/20 1754  SpO2 100 % 08/11/20 1754  Vitals shown include unvalidated device data.  Last Pain:  Vitals:   08/11/20 1352  TempSrc:   PainSc: 10-Worst pain ever      Patients Stated Pain Goal: 3 (08/11/20 1352)  Complications: No complications documented.
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
## 2020-08-11 NOTE — ED Notes (Signed)
r 36  Pulse ox 100

## 2020-08-11 NOTE — ED Notes (Signed)
Pelvic binder applied by the edp

## 2020-08-11 NOTE — Progress Notes (Signed)
Orthopedic Tech Progress Note Patient Details:  Jake Silva 1980/07/18 [id number]  Ortho Devices Type of Ortho Device: Arm sling Ortho Device/Splint Location: delivered sling to room at drs request. Ortho Device/Splint Interventions: Ordered,Application,Adjustment   Post Interventions Patient Tolerated: Well Instructions Provided: Care of device,Adjustment of device   [name] 08/11/2020, 5:09 AM

## 2020-08-11 NOTE — ED Notes (Signed)
Attempted report x1. Nurse unavailable  

## 2020-08-11 NOTE — ED Notes (Signed)
Portable xrays 

## 2020-08-11 NOTE — Progress Notes (Signed)
OT Cancellation Note  Patient Details Name: Jasdeep Kepner MRN: [id number] DOB: 1981-03-07   Cancelled Treatment:    Reason Eval/Treat Not Completed: Patient not medically ready.  Awaiting ortho consult.    Eber Jones., OTR/L Acute Rehabilitation Services Pager [phone] Office [phone]   Jeani Hawking M 08/11/2020, 7:25 AM

## 2020-08-11 NOTE — Progress Notes (Signed)
PT Cancellation Note  Patient Details Name: Jake Silva MRN: [id number] DOB: [birth year]   Cancelled Treatment:    Reason Eval/Treat Not Completed: Patient not medically ready (awaiting ortho consult)   Olivia Canter 08/11/2020, 11:55 AM

## 2020-08-11 NOTE — ED Notes (Signed)
Jake Silva bp 100/62
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
## 2020-08-12 ENCOUNTER — Observation Stay (HOSPITAL_COMMUNITY): Payer: Self-pay

## 2020-08-12 LAB — CBC
HCT: 34.2 % — ABNORMAL LOW (ref 39.0–52.0)
Hemoglobin: 11.6 g/dL — ABNORMAL LOW (ref 13.0–17.0)
MCH: 32.8 pg (ref 26.0–34.0)
MCHC: 33.9 g/dL (ref 30.0–36.0)
MCV: 96.6 fL (ref 80.0–100.0)
Platelets: 209 10*3/uL (ref 150–400)
RBC: 3.54 MIL/uL — ABNORMAL LOW (ref 4.22–5.81)
RDW: 13.3 % (ref 11.5–15.5)
WBC: 9.4 10*3/uL (ref 4.0–10.5)
nRBC: 0 % (ref 0.0–0.2)

## 2020-08-12 LAB — BASIC METABOLIC PANEL
Anion gap: 10 (ref 5–15)
BUN: 9 mg/dL (ref 6–20)
CO2: 22 mmol/L (ref 22–32)
Calcium: 8.5 mg/dL — ABNORMAL LOW (ref 8.9–10.3)
Chloride: 100 mmol/L (ref 98–111)
Creatinine, Ser: 1.05 mg/dL (ref 0.61–1.24)
GFR, Estimated: >60 mL/min (ref >60)
Glucose, Bld: 129 mg/dL — ABNORMAL HIGH (ref 70–99)
Potassium: 4.6 mmol/L (ref 3.5–5.1)
Sodium: 132 mmol/L — ABNORMAL LOW (ref 135–145)

## 2020-08-12 LAB — SAMPLE TO BLOOD BANK

## 2020-08-12 LAB — VITAMIN D 25 HYDROXY (VIT D DEFICIENCY, FRACTURES): Vit D, 25-Hydroxy: 12.32 ng/mL — ABNORMAL LOW (ref 30–100)

## 2020-08-12 LAB — HIV ANTIBODY (ROUTINE TESTING W REFLEX): HIV Screen 4th Generation wRfx: NONREACTIVE

## 2020-08-12 MED ORDER — KETOROLAC TROMETHAMINE 15 MG/ML IJ SOLN
15.0000 mg | Freq: Four times a day (QID) | INTRAMUSCULAR | Status: AC
  Administered 2020-08-12 – 2020-08-13 (×5): 15 mg via INTRAVENOUS
  Filled 2020-08-12 (×5): qty 1
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
## 2020-08-12 NOTE — Progress Notes (Signed)
PT Cancellation Note  Patient Details Name: Brecken Dewoody MRN: [id number] DOB: 10-17-1980   Cancelled Treatment:    Reason Eval/Treat Not Completed: Medical issues which prohibited therapy - awaiting R ankle imaging to r/o fx, will check back.  Marye Round, PT DPT Acute Rehabilitation Services Pager [phone]  Office [phone]    Truddie Coco 08/12/2020, 9:23 AM
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
## 2020-08-12 NOTE — Progress Notes (Signed)
OT Cancellation Note  Patient Details Name: Jake Silva MRN: [id number] DOB: [birth year]   Cancelled Treatment:    Reason Eval/Treat Not Completed: Patient at procedure or test/ unavailable (Pt off of the floor for R ankle imaging; will attempt OT evaluation today as time permits)  Charika Mikelson A Linda Biehn 08/12/2020, 9:41 AM

## 2020-08-12 NOTE — Progress Notes (Signed)
Orthopedic Tech Progress Note Patient Details:  Jake Silva [birth year] [id number] Called in order to [employer] for a [employer] HINGED KNEE BRACE Patient ID: Jake Silva, male   DOB: [birth year], 40 y.o.   MRN: [id number]   [name] 08/12/2020, 12:37 PM

## 2020-08-13 ENCOUNTER — Encounter (HOSPITAL_COMMUNITY): Payer: Self-pay | Admitting: Student

## 2020-08-13 ENCOUNTER — Inpatient Hospital Stay (HOSPITAL_COMMUNITY): Payer: Self-pay

## 2020-08-13 LAB — COMPREHENSIVE METABOLIC PANEL
ALT: 28 U/L (ref 0–44)
AST: 109 U/L — ABNORMAL HIGH (ref 15–41)
Albumin: 2.5 g/dL — ABNORMAL LOW (ref 3.5–5.0)
Alkaline Phosphatase: 41 U/L (ref 38–126)
Anion gap: 4 — ABNORMAL LOW (ref 5–15)
BUN: <5 mg/dL — ABNORMAL LOW (ref 6–20)
CO2: 26 mmol/L (ref 22–32)
Calcium: 8 mg/dL — ABNORMAL LOW (ref 8.9–10.3)
Chloride: 105 mmol/L (ref 98–111)
Creatinine, Ser: 0.77 mg/dL (ref 0.61–1.24)
GFR, Estimated: >60 mL/min (ref >60)
Glucose, Bld: 97 mg/dL (ref 70–99)
Potassium: 3.8 mmol/L (ref 3.5–5.1)
Sodium: 135 mmol/L (ref 135–145)
Total Bilirubin: 0.2 mg/dL — ABNORMAL LOW (ref 0.3–1.2)
Total Protein: 4.8 g/dL — ABNORMAL LOW (ref 6.5–8.1)

## 2020-08-13 LAB — BASIC METABOLIC PANEL
Anion gap: 4 — ABNORMAL LOW (ref 5–15)
BUN: 6 mg/dL (ref 6–20)
CO2: 26 mmol/L (ref 22–32)
Calcium: 7.7 mg/dL — ABNORMAL LOW (ref 8.9–10.3)
Chloride: 106 mmol/L (ref 98–111)
Creatinine, Ser: 0.87 mg/dL (ref 0.61–1.24)
GFR, Estimated: >60 mL/min (ref >60)
Glucose, Bld: 97 mg/dL (ref 70–99)
Potassium: 3.9 mmol/L (ref 3.5–5.1)
Sodium: 136 mmol/L (ref 135–145)

## 2020-08-13 LAB — CBC
HCT: 27.4 % — ABNORMAL LOW (ref 39.0–52.0)
Hemoglobin: 9.1 g/dL — ABNORMAL LOW (ref 13.0–17.0)
MCH: 32.3 pg (ref 26.0–34.0)
MCHC: 33.2 g/dL (ref 30.0–36.0)
MCV: 97.2 fL (ref 80.0–100.0)
Platelets: 188 10*3/uL (ref 150–400)
RBC: 2.82 MIL/uL — ABNORMAL LOW (ref 4.22–5.81)
RDW: 13.2 % (ref 11.5–15.5)
WBC: 7.2 10*3/uL (ref 4.0–10.5)
nRBC: 0 % (ref 0.0–0.2)

## 2020-08-13 LAB — PHOSPHORUS: Phosphorus: 3.3 mg/dL (ref 2.5–4.6)

## 2020-08-13 LAB — MAGNESIUM: Magnesium: 1.7 mg/dL (ref 1.7–2.4)

## 2020-08-13 MED ORDER — FOLIC ACID 1 MG PO TABS
1.0000 mg | ORAL_TABLET | Freq: Every day | ORAL | Status: DC
  Administered 2020-08-13 – 2020-08-21 (×9): 1 mg via ORAL
  Filled 2020-08-13 (×9): qty 1

## 2020-08-13 MED ORDER — VITAMIN D (ERGOCALCIFEROL) 1.25 MG (50000 UNIT) PO CAPS
50000.0000 [IU] | ORAL_CAPSULE | ORAL | Status: DC
  Administered 2020-08-13 – 2020-08-20 (×2): 50000 [IU] via ORAL
  Filled 2020-08-13 (×2): qty 1

## 2020-08-13 MED ORDER — LORAZEPAM 2 MG/ML IJ SOLN
1.0000 mg | INTRAMUSCULAR | Status: AC | PRN
Start: 2020-08-13 — End: 2020-08-16

## 2020-08-13 MED ORDER — ADULT MULTIVITAMIN W/MINERALS CH
1.0000 | ORAL_TABLET | Freq: Every day | ORAL | Status: DC
  Administered 2020-08-13 – 2020-08-21 (×9): 1 via ORAL
  Filled 2020-08-13 (×9): qty 1

## 2020-08-13 MED ORDER — GABAPENTIN 100 MG PO CAPS
200.0000 mg | ORAL_CAPSULE | Freq: Three times a day (TID) | ORAL | Status: DC
  Administered 2020-08-13 – 2020-08-14 (×6): 200 mg via ORAL
  Filled 2020-08-13 (×6): qty 2

## 2020-08-13 MED ORDER — THIAMINE HCL 100 MG PO TABS
100.0000 mg | ORAL_TABLET | Freq: Every day | ORAL | Status: DC
  Administered 2020-08-13 – 2020-08-21 (×9): 100 mg via ORAL
  Filled 2020-08-13 (×9): qty 1

## 2020-08-13 MED ORDER — LORAZEPAM 1 MG PO TABS: 1.0000 mg | ORAL_TABLET | ORAL | Status: AC | PRN

## 2020-08-13 MED ORDER — THIAMINE HCL 100 MG/ML IJ SOLN
100.0000 mg | Freq: Every day | INTRAMUSCULAR | Status: DC
  Filled 2020-08-13 (×2): qty 2
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
## 2020-08-13 NOTE — Progress Notes (Addendum)
Orthopedic Tech Progress Note Patient Details:  Jake Silva 1980-06-11 [id number] THERAPY requested a SHOULDER IMMOBILIZER for patient applied. Wasn't applied to apply finger splint due to dressing all up patient's hand and arm. Spoke with THERAPIST about making patient a splint if need be. Left finger splint in room just in case Ortho Devices Type of Ortho Device: Shoulder immobilizer Ortho Device/Splint Location: LUE Ortho Device/Splint Interventions: Ordered,Application,Adjustment   Post Interventions Patient Tolerated: Well Instructions Provided: Care of device   [name] 08/13/2020, 6:12 PM

## 2020-08-13 NOTE — Progress Notes (Signed)
Orthopaedic Trauma Progress Note  SUBJECTIVE: Continues to have pain in groin as well as right knee. Notes some tingling pain from right pelvis down to knee. Trauma team plans to start some gabapentin today. Ankle feeling slightly better. Ankle x-ray negative for fracture. No chest pain. No SOB. No nausea/vomiting. No other complaints.  Wife is at bedside. Patient and his wife were staying intermittently at motels and with friends prior to admission. He is not sure where he will go after discharge.   OBJECTIVE:  Vitals:   08/13/20 0610 08/13/20 0802  BP: (!) 102/57 109/66  Pulse: 65 74  Resp: 20 17  Temp: 97.8 F (36.6 C) 98.6 F (37 C)  SpO2: 100% 95%    General: Resting in bed, no acute distress Respiratory: No increased work of breathing.  Pelvis/LLE: Dressings removed, incisions CDI.  No significant tenderness of palpation of the hip or throughout left extremity.  Ankle dorsiflexion/plantarflexion is intact.  Foot warm and well-perfused.  Compartments soft and compressible throughout extremity he is neurovascularly intact  RLE: Skin without lesions.  No significant bruising or swelling throughout extremity.  Does have tenderness with palpation of the medial aspect of the knee, is worsened with motion.  Ankle with minimal to no swelling.  Tender over the medial and lateral malleolus. Improving some from yesterday.  No instability noted on exam.  Ankle dorsiflexion/plantarflexion is intact.  Extremity warm and well-perfused.  Compartments soft and compressible.  He is neurovascularly intact  IMAGING: Stable post op imaging of pelvis. X-rays R knee revealed avulsion fracture medial distal femur.   LABS:  Results for orders placed or performed during the hospital encounter of 08/11/20 (from the past 24 hour(s))  Basic metabolic panel     Status: Abnormal   Collection Time: 08/13/20  2:03 AM  Result Value Ref Range   Sodium 136 135 - 145 mmol/L   Potassium 3.9 3.5 - 5.1 mmol/L    Chloride 106 98 - 111 mmol/L   CO2 26 22 - 32 mmol/L   Glucose, Bld 97 70 - 99 mg/dL   BUN 6 6 - 20 mg/dL   Creatinine, Ser 6.22 0.61 - 1.24 mg/dL   Calcium 7.7 (L) 8.9 - 10.3 mg/dL   GFR, Estimated >29 >79 mL/min   Anion gap 4 (L) 5 - 15  CBC     Status: Abnormal   Collection Time: 08/13/20  2:03 AM  Result Value Ref Range   WBC 7.2 4.0 - 10.5 K/uL   RBC 2.82 (L) 4.22 - 5.81 MIL/uL   Hemoglobin 9.1 (L) 13.0 - 17.0 g/dL   HCT 89.2 (L) 11.9 - 41.7 %   MCV 97.2 80.0 - 100.0 fL   MCH 32.3 26.0 - 34.0 pg   MCHC 33.2 30.0 - 36.0 g/dL   RDW 40.8 14.4 - 81.8 %   Platelets 188 150 - 400 K/uL   nRBC 0.0 0.0 - 0.2 %    ASSESSMENT: Jake Silva is a 40 y.o. male, 2 Days Post-Op s/p OPEN REDUCTION INTERNAL FIXATION  PELVIC FRACTURE  Other injuries include: 1.  Left distal clavicle fracture with suspected AC injury - non-op management 2. Left 3rd distal phalanx fracture - Finger splint ordered 3.  LUE lacerations 4.  Orbital fractures  CV/Blood loss: Acute blood loss anemia, Hgb 9.1 this AM. Hemodynamically stable  PLAN: Weightbearing: WBAT RLE in hinged knee brace, TDWB LLE ROM: Okay for unrestricted hip and knee motion bilaterally.  Okay for unrestricted ROM left shoulder, elbow,  wrist, hand as tolerated Incisional and dressing care: Change as needed Showering: Okay to begin showering with assistance Orthopedic device(s): Finger splint L 3rd digit. Sling for comfort LUE.  Hinged knee brace RLE Pain management:  1. Tylenol 1000 mg q 6 hours scheduled 2. Robaxin 1000 mg 3 times daily  3. Oxycodone 5-15 mg q 4 hours PRN 4. Dilaudid 0.5-1 mg q 4 hours PRN 5. Toradol 15 mg every 6 hours x5 doses VTE prophylaxis: Lovenox, SCDs ID: Ancef 2gm post op completed Foley/Lines: No foley.  KVO IVFs Impediments to Fracture Healing: Vitamin D level 12, start on D2 supplementation. Continue this at d/c Dispo: Therapies as tolerated, PT/OT recommending CIR. Consult for this has been placed. Will  continue to monitor right knee, if patient continues to have significant pain with mobility in hinged knee brace we will plan to obtain MRI right knee. Have ordered CT scan pelvis to evaluate hardware post-operatively Follow - up plan: Will continue to follow while in hospital, plan for outpatient follow-up 2 weeks after d/c  Contact information:  Truitt Merle MD, Ulyses Southward PA-C. After hours and holidays please check [email or web address] for group call information for [facility]   Jazzalynn Rhudy A. Michaelyn Barter, PA-C [phone] (office) [email or web address]

## 2020-08-13 NOTE — Progress Notes (Signed)
Inpatient Rehab Admissions Coordinator:   I spoke with pt.'s aunt Eather Colas, who states that she will talk with other family to see if they are able to take Pt. In and care for him following discharge from CIR.      Megan Salon, MS, CCC-SLP Rehab Admissions Coordinator  [phone] (celll) [phone] (office)

## 2020-08-13 NOTE — Progress Notes (Signed)
Inpatient Rehab Admissions Coordinator Note:   Per therapy recommendations, pt was screened for CIR candidacy by Rie Mcneil, MS CCC-SLP. At this time, Pt. Appears to have functional decline and is a good candidate for CIR. Will request order for rehab consult per protocol.  Please contact me with questions.   Deondre Marinaro, MS, CCC-SLP Rehab Admissions Coordinator  [phone] (celll) [phone] (office)  

## 2020-08-13 NOTE — Progress Notes (Signed)
Inpatient Rehab Admissions Coordinator:   I met with Pt. And wife today to discuss potential CIR admission. Pt. Is interested in CIR but stated that he is homeless. He states he does have some family he may be able to stay with following d/c from hospital but he is not sure. He states he will call family to ask. If Pt. Is able to identify dispo for CIR, we can consider him, but without dispo, Pt. May require LOG bed for rehab at SNF.   Clemens Catholic, [place], [place] Admissions Coordinator  [phone] ([place]) [phone] (office)
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
## 2020-08-14 LAB — CBC
HCT: 29.5 % — ABNORMAL LOW (ref 39.0–52.0)
Hemoglobin: 9.9 g/dL — ABNORMAL LOW (ref 13.0–17.0)
MCH: 32.2 pg (ref 26.0–34.0)
MCHC: 33.6 g/dL (ref 30.0–36.0)
MCV: 96.1 fL (ref 80.0–100.0)
Platelets: 205 10*3/uL (ref 150–400)
RBC: 3.07 MIL/uL — ABNORMAL LOW (ref 4.22–5.81)
RDW: 13.1 % (ref 11.5–15.5)
WBC: 6.1 10*3/uL (ref 4.0–10.5)
nRBC: 0 % (ref 0.0–0.2)

## 2020-08-14 LAB — BASIC METABOLIC PANEL
Anion gap: 8 (ref 5–15)
BUN: <5 mg/dL — ABNORMAL LOW (ref 6–20)
CO2: 27 mmol/L (ref 22–32)
Calcium: 8.3 mg/dL — ABNORMAL LOW (ref 8.9–10.3)
Chloride: 100 mmol/L (ref 98–111)
Creatinine, Ser: 0.77 mg/dL (ref 0.61–1.24)
GFR, Estimated: >60 mL/min (ref >60)
Glucose, Bld: 102 mg/dL — ABNORMAL HIGH (ref 70–99)
Potassium: 3.6 mmol/L (ref 3.5–5.1)
Sodium: 135 mmol/L (ref 135–145)

## 2020-08-14 MED ORDER — HYDROMORPHONE HCL 1 MG/ML IJ SOLN
0.5000 mg | Freq: Three times a day (TID) | INTRAMUSCULAR | Status: DC | PRN
  Administered 2020-08-14: 1 mg via INTRAVENOUS
  Filled 2020-08-14: qty 1

## 2020-08-14 NOTE — Progress Notes (Signed)
Orthopaedic Trauma Progress Note  SUBJECTIVE: Pain stable. Making slow progress with therapies. Was able to stand and pivot to bedside chair yesterday. Notes that pain in right knee felt better when standing with hinge brace in place.  Asking about getting a shower today. Wife is at bedside. CIR admission coordinator saw patient yesterday. Unless he can secure place to stay at discharge, may not be able to go to CIR. Family is working on this.   OBJECTIVE:  Vitals:   08/13/20 1325 08/13/20 2156  BP: 112/74 125/75  Pulse: 71 88  Resp: 17 18  Temp: 99.2 F (37.3 C) 100 F (37.8 C)  SpO2: 97% 100%    General: Resting in bed, no acute distress Respiratory: No increased work of breathing.  Pelvis/LLE: Dressings removed, incisions CDI.  No significant tenderness of palpation of the hip or throughout left extremity.  Ankle dorsiflexion/plantarflexion is intact.  Foot warm and well-perfused.  Compartments soft and compressible throughout extremity he is neurovascularly intact  RLE: Skin without lesions.  No significant bruising or swelling throughout extremity.  Does have tenderness with palpation of the medial aspect of the knee, is worsened with motion.  Ankle with minimal to no swelling.  Tender over the medial and lateral malleolus. Improving some from yesterday.  No instability noted on exam.  Ankle dorsiflexion/plantarflexion is intact.  Extremity warm and well-perfused.  Compartments soft and compressible.  He is neurovascularly intact  LUE: Dressings removed, laceration over elbow with a few staples in place. New dressing applied. Able to wiggle fingers. Tolerated gentle elbow ROM. Tender over 3rd digit. Almost able to make a full fist, limited by pain. +radial pulse  IMAGING: Stable post op imaging of pelvis. X-rays R knee revealed avulsion fracture medial distal femur. Left ankle x-ray negative  LABS:  Results for orders placed or performed during the hospital encounter of 08/11/20 (from  the past 24 hour(s))  Comprehensive metabolic panel     Status: Abnormal   Collection Time: 08/13/20  9:42 AM  Result Value Ref Range   Sodium 135 135 - 145 mmol/L   Potassium 3.8 3.5 - 5.1 mmol/L   Chloride 105 98 - 111 mmol/L   CO2 26 22 - 32 mmol/L   Glucose, Bld 97 70 - 99 mg/dL   BUN <5 (L) 6 - 20 mg/dL   Creatinine, Ser 1.88 0.61 - 1.24 mg/dL   Calcium 8.0 (L) 8.9 - 10.3 mg/dL   Total Protein 4.8 (L) 6.5 - 8.1 g/dL   Albumin 2.5 (L) 3.5 - 5.0 g/dL   AST 416 (H) 15 - 41 U/L   ALT 28 0 - 44 U/L   Alkaline Phosphatase 41 38 - 126 U/L   Total Bilirubin 0.2 (L) 0.3 - 1.2 mg/dL   GFR, Estimated >60 >63 mL/min   Anion gap 4 (L) 5 - 15  Magnesium     Status: None   Collection Time: 08/13/20  9:42 AM  Result Value Ref Range   Magnesium 1.7 1.7 - 2.4 mg/dL  Phosphorus     Status: None   Collection Time: 08/13/20  9:42 AM  Result Value Ref Range   Phosphorus 3.3 2.5 - 4.6 mg/dL  Basic metabolic panel     Status: Abnormal   Collection Time: 08/14/20  2:22 AM  Result Value Ref Range   Sodium 135 135 - 145 mmol/L   Potassium 3.6 3.5 - 5.1 mmol/L   Chloride 100 98 - 111 mmol/L   CO2 27 22 -  32 mmol/L   Glucose, Bld 102 (H) 70 - 99 mg/dL   BUN <5 (L) 6 - 20 mg/dL   Creatinine, Ser 1.06 0.61 - 1.24 mg/dL   Calcium 8.3 (L) 8.9 - 10.3 mg/dL   GFR, Estimated >26 >94 mL/min   Anion gap 8 5 - 15  CBC     Status: Abnormal   Collection Time: 08/14/20  2:22 AM  Result Value Ref Range   WBC 6.1 4.0 - 10.5 K/uL   RBC 3.07 (L) 4.22 - 5.81 MIL/uL   Hemoglobin 9.9 (L) 13.0 - 17.0 g/dL   HCT 85.4 (L) 62.7 - 03.5 %   MCV 96.1 80.0 - 100.0 fL   MCH 32.2 26.0 - 34.0 pg   MCHC 33.6 30.0 - 36.0 g/dL   RDW 00.9 38.1 - 82.9 %   Platelets 205 150 - 400 K/uL   nRBC 0.0 0.0 - 0.2 %    ASSESSMENT: Jake Silva is a 40 y.o. male, 3 Days Post-Op s/p OPEN REDUCTION INTERNAL FIXATION  PELVIC FRACTURE  Other injuries include: 1.  Left distal clavicle fracture with suspected AC injury - non-op  management 2. Left 3rd distal phalanx fracture - Finger splint ordered 3.  LUE lacerations 4.  Orbital fractures  CV/Blood loss: Acute blood loss anemia, Hgb 9.1 this AM. Hemodynamically stable  PLAN: Weightbearing: WBAT RLE in hinged knee brace, TDWB LLE ROM: Okay for unrestricted hip and knee motion bilaterally.  Okay for unrestricted ROM left shoulder, elbow, wrist, hand as tolerated Incisional and dressing care: Change as needed Showering: Okay to begin showering with assistance. All incisions may get wet Orthopedic device(s): Finger splint L 3rd digit. Sling for comfort LUE.  Hinged knee brace RLE Pain management:  1. Tylenol 1000 mg q 6 hours scheduled 2. Robaxin 1000 mg 3 times daily  3. Oxycodone 5-15 mg q 4 hours PRN 4. Dilaudid 0.5-1 mg q 4 hours PRN 5. Toradol 15 mg every 6 hours x5 doses VTE prophylaxis: Lovenox, SCDs ID: [id number] 2gm post op completed Foley/Lines: No foley.  KVO IVFs Impediments to Fracture Healing: Vitamin D level 12, start on D2 supplementation. Continue this at d/c Dispo: Therapies as tolerated, PT/OT recommending CIR. Will continue to monitor right knee, if patient continues to have significant pain with mobility in hinged knee brace we will plan to obtain MRI right knee.  Follow - up plan: Will continue to follow while in hospital, plan for outpatient follow-up 2 weeks after d/c  Contact information:  Truitt Merle MD, Ulyses Southward PA-C. After hours and holidays please check [email or web address] for group call information for [facility]   Jake Nick A. Michaelyn Barter, PA-C [phone] (office) [email or web address]

## 2020-08-14 NOTE — Progress Notes (Addendum)
Inpatient Rehab Admissions Coordinator:   Pt. States that his nephew may let him and his partner stay with him but he does not have contact info for him. I notified him that his aunt Eather Colas, is also working to see if other family can assist in caring for Pt. I do not have a bed for this pt. Today but will follow for potential admit pending bed availability, medical readiness, and identification of a dispo.   Megan Salon, MS, CCC-SLP Rehab Admissions Coordinator  [phone] (celll) [phone] (office)
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
## 2020-08-14 NOTE — Progress Notes (Signed)
Orthopedic Tech Progress Note Patient Details:  Jake Silva 1980/10/02 [id number]  Ortho Devices Type of Ortho Device: Finger splint Ortho Device/Splint Location: LUE Ortho Device/Splint Interventions: Ordered,Application,Adjustment   Post Interventions Patient Tolerated: Well Instructions Provided: Care of device   [name] 08/14/2020, 8:54 AM
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
## 2020-08-15 MED ORDER — LIDOCAINE 5 % EX PTCH
1.0000 | MEDICATED_PATCH | CUTANEOUS | Status: DC
  Administered 2020-08-15 – 2020-08-21 (×7): 1 via TRANSDERMAL
  Filled 2020-08-15 (×8): qty 1

## 2020-08-15 MED ORDER — GABAPENTIN 300 MG PO CAPS
300.0000 mg | ORAL_CAPSULE | Freq: Three times a day (TID) | ORAL | Status: DC
  Administered 2020-08-15 – 2020-08-20 (×19): 300 mg via ORAL
  Filled 2020-08-15 (×18): qty 1

## 2020-08-15 MED ORDER — METHOCARBAMOL 500 MG PO TABS
1000.0000 mg | ORAL_TABLET | Freq: Four times a day (QID) | ORAL | Status: DC
  Administered 2020-08-15 – 2020-08-21 (×24): 1000 mg via ORAL
  Filled 2020-08-15 (×24): qty 2

## 2020-08-15 MED ORDER — POLYETHYLENE GLYCOL 3350 17 G PO PACK
17.0000 g | PACK | Freq: Two times a day (BID) | ORAL | Status: DC
  Administered 2020-08-15 (×2): 17 g via ORAL
  Filled 2020-08-15 (×7): qty 1
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
## 2020-08-15 NOTE — Progress Notes (Signed)
IP rehab admissions - noted DC plan not confirmed yet.  I have no CIR bed available for this patient today.  Will follow up again tomorrow.  Call for questions.  [phone]
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
## 2020-08-16 MED ORDER — BACITRACIN-NEOMYCIN-POLYMYXIN OINTMENT TUBE
1.0000 "application " | TOPICAL_OINTMENT | Freq: Two times a day (BID) | CUTANEOUS | Status: AC
  Administered 2020-08-17 – 2020-08-18 (×2): 1 via TOPICAL
  Filled 2020-08-16: qty 14

## 2020-08-16 NOTE — Plan of Care (Signed)
  Problem: Pain Managment: Goal: General experience of comfort will improve Outcome: Progressing   
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
## 2020-08-18 ENCOUNTER — Other Ambulatory Visit: Payer: Self-pay

## 2020-08-18 LAB — CREATININE, SERUM
Creatinine, Ser: 0.85 mg/dL (ref 0.61–1.24)
GFR, Estimated: >60 mL/min (ref >60)
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
## 2020-08-18 NOTE — Plan of Care (Signed)
  Problem: Health Behavior/Discharge Planning: Goal: Ability to manage health-related needs will improve Outcome: Progressing   Problem: Activity: Goal: Risk for activity intolerance will decrease Outcome: Progressing   Problem: Pain Managment: Goal: General experience of comfort will improve Outcome: Progressing   

## 2020-08-19 ENCOUNTER — Encounter (HOSPITAL_COMMUNITY): Payer: Self-pay | Admitting: Student

## 2020-08-19 MED ORDER — POLYETHYLENE GLYCOL 3350 17 G PO PACK
17.0000 g | PACK | Freq: Every day | ORAL | Status: DC
  Administered 2020-08-20 – 2020-08-21 (×2): 17 g via ORAL
  Filled 2020-08-19 (×2): qty 1

## 2020-08-19 MED ORDER — OXYCODONE HCL 5 MG PO TABS
10.0000 mg | ORAL_TABLET | ORAL | Status: DC | PRN
  Administered 2020-08-19 – 2020-08-21 (×10): 15 mg via ORAL
  Filled 2020-08-19 (×10): qty 3

## 2020-08-19 MED ORDER — ZOLPIDEM TARTRATE 5 MG PO TABS
5.0000 mg | ORAL_TABLET | Freq: Once | ORAL | Status: AC
  Administered 2020-08-19: 5 mg via ORAL
  Filled 2020-08-19: qty 1
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
## 2020-08-19 NOTE — Progress Notes (Addendum)
Inpatient Rehab Admissions Coordinator:   I spoke with Pt's cousin [name] who states that she and her boyfriend will allow Pt. To come live with them and that they can provide 24/7 assistance following discharge from CIR. I do not have a bed for this Pt. Today but will follow for potential admit pending bed availability. I spoke with Pt. And reviewed estimate for cost of care for CIR   Megan Salon, MS, CCC-SLP Rehab Admissions Coordinator  [phone] (celll) [phone] (office)

## 2020-08-20 MED ORDER — BOOST / RESOURCE BREEZE PO LIQD CUSTOM
1.0000 | Freq: Three times a day (TID) | ORAL | Status: DC
  Administered 2020-08-20 – 2020-08-21 (×4): 1 via ORAL

## 2020-08-20 MED ORDER — NAPHAZOLINE-GLYCERIN 0.012-0.25 % OP SOLN
1.0000 [drp] | Freq: Four times a day (QID) | OPHTHALMIC | Status: DC | PRN
  Filled 2020-08-20: qty 15
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
## 2020-08-20 NOTE — Progress Notes (Signed)
Inpatient Rehab Admissions Coordinator:   I do not have a bed for this Pt. On CIR today. I will follow for potential admit pending bed availability  Megan Salon, MS, CCC-SLP Rehab Admissions Coordinator  [phone] (celll) [phone] (office)

## 2020-08-21 ENCOUNTER — Inpatient Hospital Stay (HOSPITAL_COMMUNITY)
Admission: RE | Admit: 2020-08-21 | Discharge: 2020-08-30 | DRG: 560 | Disposition: A | Discharge status: Home / Self Care | Payer: Self-pay | Source: Intra-hospital | Attending: Physical Medicine and Rehabilitation | Admitting: Physical Medicine and Rehabilitation

## 2020-08-21 ENCOUNTER — Encounter (HOSPITAL_COMMUNITY): Payer: Self-pay | Admitting: Physical Medicine and Rehabilitation

## 2020-08-21 ENCOUNTER — Other Ambulatory Visit: Payer: Self-pay

## 2020-08-21 ENCOUNTER — Encounter (HOSPITAL_COMMUNITY): Payer: Self-pay

## 2020-08-21 DIAGNOSIS — S62603D Fracture of unspecified phalanx of left middle finger, subsequent encounter for fracture with routine healing: Secondary | ICD-10-CM

## 2020-08-21 DIAGNOSIS — T07XXXA Unspecified multiple injuries, initial encounter: Secondary | ICD-10-CM | POA: Diagnosis present

## 2020-08-21 DIAGNOSIS — T1490XA Injury, unspecified, initial encounter: Secondary | ICD-10-CM

## 2020-08-21 DIAGNOSIS — D62 Acute posthemorrhagic anemia: Secondary | ICD-10-CM | POA: Diagnosis present

## 2020-08-21 DIAGNOSIS — S41112D Laceration without foreign body of left upper arm, subsequent encounter: Secondary | ICD-10-CM

## 2020-08-21 DIAGNOSIS — F1010 Alcohol abuse, uncomplicated: Secondary | ICD-10-CM | POA: Diagnosis present

## 2020-08-21 DIAGNOSIS — S02842D Fracture of lateral orbital wall, left side, subsequent encounter for fracture with routine healing: Secondary | ICD-10-CM

## 2020-08-21 DIAGNOSIS — S32302D Unspecified fracture of left ilium, subsequent encounter for fracture with routine healing: Secondary | ICD-10-CM

## 2020-08-21 DIAGNOSIS — S334XXD Traumatic rupture of symphysis pubis, subsequent encounter: Secondary | ICD-10-CM

## 2020-08-21 DIAGNOSIS — S42032D Displaced fracture of lateral end of left clavicle, subsequent encounter for fracture with routine healing: Secondary | ICD-10-CM

## 2020-08-21 DIAGNOSIS — K5900 Constipation, unspecified: Secondary | ICD-10-CM | POA: Diagnosis present

## 2020-08-21 DIAGNOSIS — Z7141 Alcohol abuse counseling and surveillance of alcoholic: Secondary | ICD-10-CM

## 2020-08-21 DIAGNOSIS — S02401D Maxillary fracture, unspecified, subsequent encounter for fracture with routine healing: Secondary | ICD-10-CM

## 2020-08-21 DIAGNOSIS — S01112D Laceration without foreign body of left eyelid and periocular area, subsequent encounter: Secondary | ICD-10-CM

## 2020-08-21 DIAGNOSIS — F17210 Nicotine dependence, cigarettes, uncomplicated: Secondary | ICD-10-CM | POA: Diagnosis present

## 2020-08-21 DIAGNOSIS — S32119D Unspecified Zone I fracture of sacrum, subsequent encounter for fracture with routine healing: Principal | ICD-10-CM

## 2020-08-21 DIAGNOSIS — Z5901 Sheltered homelessness: Secondary | ICD-10-CM

## 2020-08-21 DIAGNOSIS — S728X1D Other fracture of right femur, subsequent encounter for closed fracture with routine healing: Secondary | ICD-10-CM

## 2020-08-21 DIAGNOSIS — S0232XD Fracture of orbital floor, left side, subsequent encounter for fracture with routine healing: Secondary | ICD-10-CM

## 2020-08-21 MED ORDER — BOOST / RESOURCE BREEZE PO LIQD CUSTOM
1.0000 | Freq: Three times a day (TID) | ORAL | Status: DC
  Administered 2020-08-21 – 2020-08-29 (×25): 1 via ORAL

## 2020-08-21 MED ORDER — GABAPENTIN 400 MG PO CAPS
400.0000 mg | ORAL_CAPSULE | Freq: Three times a day (TID) | ORAL | Status: DC
  Administered 2020-08-21: 400 mg via ORAL
  Filled 2020-08-21: qty 1

## 2020-08-21 MED ORDER — ACETAMINOPHEN 325 MG PO TABS
650.0000 mg | ORAL_TABLET | Freq: Four times a day (QID) | ORAL | Status: DC | PRN
  Administered 2020-08-23 – 2020-08-28 (×4): 650 mg via ORAL
  Filled 2020-08-21 (×4): qty 2

## 2020-08-21 MED ORDER — OXYCODONE HCL 5 MG PO TABS
10.0000 mg | ORAL_TABLET | ORAL | Status: DC | PRN
  Administered 2020-08-21 – 2020-08-23 (×7): 15 mg via ORAL
  Administered 2020-08-23: 11:00:00 10 mg via ORAL
  Administered 2020-08-23 – 2020-08-24 (×4): 15 mg via ORAL
  Administered 2020-08-24: 16:00:00 10 mg via ORAL
  Administered 2020-08-24 – 2020-08-30 (×14): 15 mg via ORAL
  Filled 2020-08-21: qty 2
  Filled 2020-08-21 (×21): qty 3
  Filled 2020-08-21: qty 2
  Filled 2020-08-21 (×5): qty 3

## 2020-08-21 MED ORDER — THIAMINE HCL 100 MG/ML IJ SOLN
100.0000 mg | Freq: Every day | INTRAMUSCULAR | Status: DC
  Filled 2020-08-21 (×3): qty 2

## 2020-08-21 MED ORDER — ADULT MULTIVITAMIN W/MINERALS CH
1.0000 | ORAL_TABLET | Freq: Every day | ORAL | Status: DC
  Administered 2020-08-22 – 2020-08-30 (×9): 1 via ORAL
  Filled 2020-08-21 (×9): qty 1

## 2020-08-21 MED ORDER — GABAPENTIN 400 MG PO CAPS
400.0000 mg | ORAL_CAPSULE | Freq: Three times a day (TID) | ORAL | Status: DC
  Administered 2020-08-21 – 2020-08-24 (×10): 400 mg via ORAL
  Filled 2020-08-21 (×10): qty 1

## 2020-08-21 MED ORDER — VITAMIN D (ERGOCALCIFEROL) 1.25 MG (50000 UNIT) PO CAPS
50000.0000 [IU] | ORAL_CAPSULE | ORAL | Status: DC
  Administered 2020-08-27: 10:00:00 50000 [IU] via ORAL
  Filled 2020-08-21: qty 1

## 2020-08-21 MED ORDER — FOLIC ACID 1 MG PO TABS
1.0000 mg | ORAL_TABLET | Freq: Every day | ORAL | Status: DC
  Administered 2020-08-22 – 2020-08-30 (×9): 1 mg via ORAL
  Filled 2020-08-21 (×9): qty 1

## 2020-08-21 MED ORDER — LIDOCAINE 5 % EX PTCH
1.0000 | MEDICATED_PATCH | Freq: Every day | CUTANEOUS | Status: DC
  Administered 2020-08-22 – 2020-08-30 (×9): 1 via TRANSDERMAL
  Filled 2020-08-21 (×10): qty 1

## 2020-08-21 MED ORDER — ENOXAPARIN SODIUM 40 MG/0.4ML IJ SOSY
40.0000 mg | PREFILLED_SYRINGE | Freq: Every day | INTRAMUSCULAR | Status: DC
  Administered 2020-08-22 – 2020-08-30 (×9): 40 mg via SUBCUTANEOUS
  Filled 2020-08-21 (×9): qty 0.4

## 2020-08-21 MED ORDER — DOCUSATE SODIUM 100 MG PO CAPS
100.0000 mg | ORAL_CAPSULE | Freq: Two times a day (BID) | ORAL | Status: DC
Start: 2020-08-21 — End: 2020-08-30
  Administered 2020-08-22 – 2020-08-30 (×16): 100 mg via ORAL
  Filled 2020-08-21 (×17): qty 1

## 2020-08-21 MED ORDER — ONDANSETRON HCL 4 MG/2ML IJ SOLN: 4.0000 mg | Freq: Four times a day (QID) | INTRAMUSCULAR | Status: DC | PRN

## 2020-08-21 MED ORDER — METHOCARBAMOL 500 MG PO TABS
1000.0000 mg | ORAL_TABLET | Freq: Four times a day (QID) | ORAL | Status: DC
  Administered 2020-08-21 – 2020-08-30 (×35): 1000 mg via ORAL
  Filled 2020-08-21 (×35): qty 2

## 2020-08-21 MED ORDER — THIAMINE HCL 100 MG PO TABS
100.0000 mg | ORAL_TABLET | Freq: Every day | ORAL | Status: DC
  Administered 2020-08-22 – 2020-08-30 (×9): 100 mg via ORAL
  Filled 2020-08-21 (×9): qty 1

## 2020-08-21 MED ORDER — NAPHAZOLINE-GLYCERIN 0.012-0.25 % OP SOLN: 1.0000 [drp] | Freq: Four times a day (QID) | OPHTHALMIC | Status: DC | PRN

## 2020-08-21 MED ORDER — ENOXAPARIN SODIUM 40 MG/0.4ML IJ SOSY: 40.0000 mg | PREFILLED_SYRINGE | INTRAMUSCULAR | Status: DC

## 2020-08-21 MED ORDER — POLYETHYLENE GLYCOL 3350 17 G PO PACK
17.0000 g | PACK | Freq: Every day | ORAL | Status: DC
  Filled 2020-08-21 (×6): qty 1

## 2020-08-21 MED ORDER — ONDANSETRON HCL 4 MG PO TABS
4.0000 mg | ORAL_TABLET | Freq: Four times a day (QID) | ORAL | Status: DC | PRN
  Filled 2020-08-21: qty 1
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
## 2020-08-21 NOTE — Discharge Instructions (Addendum)
Inpatient Rehab Discharge Instructions  Jake Silva Discharge date and time: No discharge date for patient encounter.   Activities/Precautions/ Functional Status: Activity:  touch down weight bearing left leg and weight bearing as tolerated right  leg with bledsoe brace  and nonweightbearing left upper extremity Diet: regular diet Wound Care: routine skin checks Functional status:  ___ No restrictions     ___ Walk up steps independently ___ 24/7 supervision/assistance   ___ Walk up steps with assistance ___ Intermittent supervision/assistance  ___ Bathe/dress independently ___ Walk with walker     _x__ Bathe/dress with assistance ___ Walk Independently    ___ Shower independently ___ Walk with assistance    ___ Shower with assistance ___ No alcohol     ___ Return to work/school ________  Special Instructions:  No driving smoking or alcohol  Continue Lovenox 40 mg daily x2 more weeks    COMMUNITY REFERRALS UPON DISCHARGE:    HOME EXERCISE PROGRAM FOR HOME  Medical Equipment/Items Ordered:WHEELCHAIR, Levan Hurst                                                 Agency/Supplier:[facility] [employer]  [phone]  MATCH FOR PRESCRIPTION ASSISTANCE   My questions have been answered and I understand these instructions. I will adhere to these goals and the provided educational materials after my discharge from the hospital.  Patient/Caregiver Signature _______________________________ Date __________  Clinician Signature _______________________________________ Date __________  Please bring this form and your medication list with you to all your follow-up doctor's appointments.
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
## 2020-08-21 NOTE — Progress Notes (Signed)
Patient arrived to floor at 1:30pm by wheelchair.. A/Ox4. Patient oriented to rehab. Patient able to make needs known.
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
## 2020-08-21 NOTE — Plan of Care (Signed)
  Problem: Health Behavior/Discharge Planning: Goal: Ability to manage health-related needs will improve Outcome: Progressing   Problem: Activity: Goal: Risk for activity intolerance will decrease Outcome: Progressing   Problem: Pain Managment: Goal: General experience of comfort will improve Outcome: Progressing   

## 2020-08-21 NOTE — Progress Notes (Signed)
Inpatient Rehabilitation Medication Review by a Pharmacist  A complete drug regimen review was completed for this patient to identify any potential clinically significant medication issues.  Clinically significant medication issues were identified:  no  Check AMION for pharmacist assigned to patient if future medication questions/issues arise during this admission.  Pharmacist comments:   Time spent performing this drug regimen review (minutes):  5 min  Jamy Cleckler S. Merilynn Finland, PharmD, BCPS Clinical Staff Pharmacist [email or web address] Pasty Spillers 08/21/2020 2:23 PM
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
## 2020-08-21 NOTE — Progress Notes (Signed)
Inpatient Rehab Admissions Coordinator:   I have a CIR bed for this Pt. Today. RN may call report to [phone] after 12pm today/  Saahil Herbster, MS, CCC-SLP Rehab Admissions Coordinator  [phone] (celll) [phone] (office)  

## 2020-08-22 ENCOUNTER — Inpatient Hospital Stay (HOSPITAL_COMMUNITY): Payer: Self-pay

## 2020-08-22 DIAGNOSIS — M7989 Other specified soft tissue disorders: Secondary | ICD-10-CM

## 2020-08-22 LAB — CBC WITH DIFFERENTIAL/PLATELET
Abs Immature Granulocytes: 0.05 10*3/uL (ref 0.00–0.07)
Basophils Absolute: 0 10*3/uL (ref 0.0–0.1)
Basophils Relative: 1 %
Eosinophils Absolute: 0.1 10*3/uL (ref 0.0–0.5)
Eosinophils Relative: 1 %
HCT: 32.1 % — ABNORMAL LOW (ref 39.0–52.0)
Hemoglobin: 10.6 g/dL — ABNORMAL LOW (ref 13.0–17.0)
Immature Granulocytes: 1 %
Lymphocytes Relative: 32 %
Lymphs Abs: 1.9 10*3/uL (ref 0.7–4.0)
MCH: 32.2 pg (ref 26.0–34.0)
MCHC: 33 g/dL (ref 30.0–36.0)
MCV: 97.6 fL (ref 80.0–100.0)
Monocytes Absolute: 1 10*3/uL (ref 0.1–1.0)
Monocytes Relative: 16 %
Neutro Abs: 2.8 10*3/uL (ref 1.7–7.7)
Neutrophils Relative %: 49 %
Platelets: 450 10*3/uL — ABNORMAL HIGH (ref 150–400)
RBC: 3.29 MIL/uL — ABNORMAL LOW (ref 4.22–5.81)
RDW: 13.2 % (ref 11.5–15.5)
WBC: 5.8 10*3/uL (ref 4.0–10.5)
nRBC: 0 % (ref 0.0–0.2)

## 2020-08-22 LAB — COMPREHENSIVE METABOLIC PANEL
ALT: 125 U/L — ABNORMAL HIGH (ref 0–44)
AST: 98 U/L — ABNORMAL HIGH (ref 15–41)
Albumin: 3.3 g/dL — ABNORMAL LOW (ref 3.5–5.0)
Alkaline Phosphatase: 116 U/L (ref 38–126)
Anion gap: 9 (ref 5–15)
BUN: 15 mg/dL (ref 6–20)
CO2: 27 mmol/L (ref 22–32)
Calcium: 9.4 mg/dL (ref 8.9–10.3)
Chloride: 98 mmol/L (ref 98–111)
Creatinine, Ser: 0.85 mg/dL (ref 0.61–1.24)
GFR, Estimated: >60 mL/min (ref >60)
Glucose, Bld: 117 mg/dL — ABNORMAL HIGH (ref 70–99)
Potassium: 4 mmol/L (ref 3.5–5.1)
Sodium: 134 mmol/L — ABNORMAL LOW (ref 135–145)
Total Bilirubin: 0.6 mg/dL (ref 0.3–1.2)
Total Protein: 6.2 g/dL — ABNORMAL LOW (ref 6.5–8.1)
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
## 2020-08-22 NOTE — Progress Notes (Signed)
Occupational Therapy Session Note  Patient Details  Name: Jake Silva MRN: [id number] Date of Birth: June 30, 1980  Today's Date: 08/22/2020 OT Individual Time: 1300-1345 OT Individual Time Calculation (min): 45 min    Short Term Goals: Week 1:  OT Short Term Goal 1 (Week 1): Pt will complete functional transfers to all appropriate surfaces with CGA. OT Short Term Goal 2 (Week 1): Pt will perform all aspects of toileting with CGA while maintaining all WB precautions. OT Short Term Goal 3 (Week 1): Pt will complete LB dressing with CGA while maintaining all Wb precautions. OT Short Term Goal 4 (Week 1): Pt will complete all UB ADLs with mod I while seated in wheelchair.  Skilled Therapeutic Interventions/Progress Updates:    Pt greeted at time of session sitting up in wheelchair agreeable to OT session, no pain throughout. Reviewed weight bearing precautions at beginning of session and pt confirming, able to repeat. Wheelchair set up at toilet and sit > stand at grab bar and standing to urinate with CGA. Maintained precautions throughout but did need Min cues to not use LUE. Placed LUE in sling after toileting with education on NWB. Pt transport to gym and squat pivot wheelchair <> mat with Min A with slightly more assist going to L side d/t injuries. Sitting basketball activity 1x20 throws, and standing throws 1x5 with Min/Mod for standing balance. Transported back to room and set up with alarm on call bell in reach.   Therapy Documentation Precautions:  Precautions Precautions: Fall Required Braces or Orthoses: Sling, Splint/Cast, Other Brace Splint/Cast: finger splint on L 3rd digit (unable to locate in room) Other Brace: Hinged knee brace (R) Unrestricted ROM Restrictions Weight Bearing Restrictions: Yes LUE Weight Bearing: Non weight bearing RLE Weight Bearing: Weight bearing as tolerated LLE Weight Bearing: Touchdown weight bearing Other Position/Activity Restrictions: hinged knee  brace and shoulder sling can be off at rest.  Pt is to maintain finger splint at all times, pt reports he took off his finger splint, unable to locate in room..     Therapy/Group: Individual Therapy  Erasmo Score 08/22/2020, 4:14 PM
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
## 2020-08-22 NOTE — Progress Notes (Signed)
Inpatient Rehabilitation Center Individual Statement of Services  Patient Name:  Jake Silva  Date:  08/22/2020  Welcome to the Inpatient Rehabilitation Center.  Our goal is to provide you with an individualized program based on your diagnosis and situation, designed to meet your specific needs.  With this comprehensive rehabilitation program, you will be expected to participate in at least 3 hours of rehabilitation therapies Monday-Friday, with modified therapy programming on the weekends.  Your rehabilitation program will include the following services:  Physical Therapy (PT), Occupational Therapy (OT), 24 hour per day rehabilitation nursing, Neuropsychology, Care Coordinator, Rehabilitation Medicine, Nutrition Services, and Pharmacy Services  Weekly team conferences will be held on Tuesday to discuss your progress.  Your Inpatient Rehabilitation Care Coordinator will talk with you frequently to get your input and to update you on team discussions.  Team conferences with you and your family in attendance may also be held.  Expected length of stay: 1.5-2 weeks  Overall anticipated outcome: supervision with cues  Depending on your progress and recovery, your program may change. Your Inpatient Rehabilitation Care Coordinator will coordinate services and will keep you informed of any changes. Your Inpatient Rehabilitation Care Coordinator's name and contact numbers are listed  below.  The following services may also be recommended but are not provided by the Inpatient Rehabilitation Center:   Home Health Rehabiltiation Services Outpatient Rehabilitation Services Vocational Rehabilitation   Arrangements will be made to provide these services after discharge if needed.  Arrangements include referral to agencies that provide these services.  Your insurance has been verified to be:  self pay Your primary doctor is:  none  Pertinent information will be shared with your doctor and your insurance  company.  Inpatient Rehabilitation Care Coordinator:  Dossie Der, [place] [phone] or Luna [place]  Information discussed with and copy given to patient by: Lucy Chris, 08/22/2020, 12:39 PM

## 2020-08-22 NOTE — Progress Notes (Signed)
Occupational Therapy Session Note  Patient Details  Name: Jake Silva MRN: [id number] Date of Birth: 04-Nov-1980  Today's Date: 08/22/2020 OT Individual Time: 1130-1200 OT Individual Time Calculation (min): 30 min    Short Term Goals: Week 1:  OT Short Term Goal 1 (Week 1): Pt will complete functional transfers to all appropriate surfaces with CGA. OT Short Term Goal 2 (Week 1): Pt will perform all aspects of toileting with CGA while maintaining all WB precautions. OT Short Term Goal 3 (Week 1): Pt will complete LB dressing with CGA while maintaining all Wb precautions. OT Short Term Goal 4 (Week 1): Pt will complete all UB ADLs with mod I while seated in wheelchair.  Skilled Therapeutic Interventions/Progress Updates:   Pt received in bed and consented to OT tx. Pt req min A for SPT to w/c from EOB towards R side. Pt req min cuing to maintain precautions. Pt wheeled down to therapy gym for time mgmt. Initiated instructed and training in BUE strengthening HEP to increase strength and activity tolerance for ADLs and functional transfers. Pt given 3#db for RUE, no weight in LUE, instructed in elbow flexion, chest press, and shoulder press for 3x15 with min cuing for proper technique with good carryover. Pt required frequent rest breaks due to fatigue. After tx, pt wheeled back to room and left up in w/c with all needs met, spouse present.    Therapy Documentation Precautions:  Precautions Precautions: Fall Required Braces or Orthoses: Sling, Splint/Cast, Other Brace Splint/Cast: finger splint on L 3rd digit (unable to locate in room) Other Brace: Hinged knee brace (R) Unrestricted ROM Restrictions Weight Bearing Restrictions: Yes LUE Weight Bearing: Non weight bearing RLE Weight Bearing: Weight bearing as tolerated LLE Weight Bearing: Touchdown weight bearing Other Position/Activity Restrictions: hinged knee brace and shoulder sling can be off at rest.  Pt is to maintain finger splint  at all times, pt reports he took off his finger splint, unable to locate in room.. General: General Chart Reviewed: Yes Family/Caregiver Present: Yes  Pain: Pain Assessment Pain Scale: 0-10 Pain Score: 7  Pain Type: Acute pain Pain Location:  (pelvis)    Therapy/Group: Individual Therapy  Harrold Fitchett 08/22/2020, 11:46 AM
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
## 2020-08-22 NOTE — Progress Notes (Signed)
Staples removed prior to admission yesterday per pt. Only one suture noted to left thigh but it is very deep

## 2020-08-22 NOTE — Progress Notes (Signed)
Inpatient Rehabilitation  Patient information reviewed and entered into eRehab system by Kaikoa Magro Amalea Ottey, OTR/L.   Information including medical coding, functional ability and quality indicators will be reviewed and updated through discharge.    

## 2020-08-22 NOTE — Progress Notes (Signed)
Bilateral lower extremity venous duplex has been completed. Preliminary results can be found in CV Proc through chart review.   08/22/20 3:56 PM Olen Cordial RVT
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
## 2020-08-23 NOTE — Progress Notes (Signed)
Occupational Therapy Session Note  Patient Details  Name: Jake Silva MRN: [id number] Date of Birth: 02/23/1981  Today's Date: 08/23/2020 OT Individual Time: 0947-0962 and 8366-2947 OT Individual Time Calculation (min): 85 min and 20 min   Short Term Goals: Week 1:  OT Short Term Goal 1 (Week 1): Pt will complete functional transfers to all appropriate surfaces with CGA. OT Short Term Goal 2 (Week 1): Pt will perform all aspects of toileting with CGA while maintaining all WB precautions. OT Short Term Goal 3 (Week 1): Pt will complete LB dressing with CGA while maintaining all Wb precautions. OT Short Term Goal 4 (Week 1): Pt will complete all UB ADLs with mod I while seated in wheelchair.  Skilled Therapeutic Interventions/Progress Updates:    Pt greeted in bed and premedicated for pain. Requesting to shower this AM. He completed oral care/shaving (seated at the sink), toileting (using elevated toilet, B+B void), bathing (seated on TTB), and dressing (sit<stand on TTB) during session. All functional transfers completed with CGA either using PFRW or the grab bar with min cues for Lt sided WB precautions. Ok to remove hinged knee brace before showering per verbal order from MD. Pt required setup-min cues for stated tasks mostly, assistance needed for donning gripper socks and knee brace. CGA for standing balance. At end of session pt transferred back to bed. Left in care of RN for morning medication/changing foam dressing on Lt elbow. Per pt, his digit splint was causing numbness/pressure on the distal aspect of his digit. He will need another splint/consult. Notified PA.   2nd Session 1:1 tx (20 min)  Pt greeted in bed, motivated to participate in tx. CGA for stand pivot<w/c using PFRW. Pt reported some muscular soreness. OT educated pt on gentle cervical/shoulder/forearm stretches to decrease muscular tension. Pt reported no pain in the Lt UE during gentle stretching. Education emphasis  placed on diaphragmatic breathing to promote parasympathetic activity. We discussed using diaphragmatic breathing for holistic pain mgt as well as for improving sleep. Pt appreciative. At end of session he completed another stand pivot<bed. Left him with all needs within reach and bed alarm set.   Therapy Documentation Precautions:  Precautions Precautions: Fall Required Braces or Orthoses: Sling, Splint/Cast, Other Brace Splint/Cast: finger splint on L 3rd digit (unable to locate in room) Other Brace: Hinged knee brace (R) Unrestricted ROM Restrictions Weight Bearing Restrictions: Yes LUE Weight Bearing: Non weight bearing RLE Weight Bearing: Weight bearing as tolerated LLE Weight Bearing: Touchdown weight bearing Other Position/Activity Restrictions: hinged knee brace and shoulder sling can be off at rest.  Pt is to maintain finger splint at all times, pt reports he took off his finger splint, unable to locate in room..  Vital Signs: Therapy Vitals Temp: 98.2 F (36.8 C) Temp Source: Oral Pulse Rate: 92 BP: 116/77 Oxygen Therapy SpO2: 100 % Pain: Pain Assessment Pain Scale: 0-10 Pain Score: Asleep Pain Type: Surgical pain Pain Location: Back Pain Orientation: Mid Pain Descriptors / Indicators: Aching;Throbbing Pain Frequency: Constant Pain Onset: On-going Patients Stated Pain Goal: 2 Pain Intervention(s): Pain med given for lower pain score than stated, per patient request Multiple Pain Sites: No ADL: ADL Eating: Set up Grooming: Setup Where Assessed-Grooming: Sitting at sink Upper Body Bathing: Supervision/safety Where Assessed-Upper Body Bathing: Sitting at sink Lower Body Bathing: Supervision/safety Where Assessed-Lower Body Bathing: Sitting at sink Upper Body Dressing: Setup Where Assessed-Upper Body Dressing: Sitting at sink Lower Body Dressing: Minimal assistance Where Assessed-Lower Body Dressing: Sitting at sink Where  Assessed-Toileting: Public house manager: Moderate assistance Toilet Transfer Method: Stand pivot Toilet Transfer Equipment: Bedside commode (BSC placed over toilet) Tub/Shower Transfer: Not assessed Psychologist, counselling Transfer: Not assessed     Therapy/Group: Individual Therapy  Jake Silva 08/23/2020, 3:10 PM

## 2020-08-23 NOTE — Progress Notes (Signed)
Occupational Therapy Session Note  Patient Details  Name: Jake Silva MRN: [id number] Date of Birth: 05/14/1980  Today's Date: 08/23/2020 OT Individual Time: 1005-1100 OT Individual Time Calculation (min): 55 min    Short Term Goals: Week 1:  OT Short Term Goal 1 (Week 1): Pt will complete functional transfers to all appropriate surfaces with CGA. OT Short Term Goal 2 (Week 1): Pt will perform all aspects of toileting with CGA while maintaining all WB precautions. OT Short Term Goal 3 (Week 1): Pt will complete LB dressing with CGA while maintaining all Wb precautions. OT Short Term Goal 4 (Week 1): Pt will complete all UB ADLs with mod I while seated in wheelchair.   Skilled Therapeutic Interventions/Progress Updates:    Pt semi upright in bed, without left middle finger splint on.  Reporting it was making his finger sore so he took it off.  C/o 7/10 pain bilateral pelvis anterior region throughout session.  Nurse made aware and medication administered for pain at end of session per pt request.  Pt requesting to use bathroom.  Reviewed weightbearing precautions to ensure better follow through.  Pt required supervision supine to sit.  Stand pivot transfer using platform walker to w/c with CGA and max Vcs due to pt with poor carryover of TDWB LLE.  Transferred to bathroom and urinated in standing at [facility].  Pt needing multimodal cues to complete stand to sit with LLE kicked out to refrain from bearing too much weight.    OT clarified with PA regarding splint orders for pts left middle finger.  Per PA instructions, custom IP gutter splint fabricated immobilizing left middle finger DIP joint into full extension and leaving PIP and MCP joint free.  Gauze applied to open skin on medial aspect of P2 to protect skin integrity.  Splint secured with paper tape.  Pt reports splint much more comfortable and increased tolerance.  Reviewed splint schedule of full time even when showering.  Reviewed splint  precautions with pt.  Instructed pt and nursing to notifiy OT with any concerns regarding splint.    Pt requesting to return to bed at end of session.  Stand pivot with PRW with CGA to EOB.  Min assist sit to supine to support BLE.  Call bell in reach, bed alarm on.   Therapy Documentation Precautions:  Precautions Precautions: Fall Required Braces or Orthoses: Sling, Splint/Cast, Other Brace Splint/Cast: finger splint on L 3rd digit (unable to locate in room) Other Brace: Hinged knee brace (R) Unrestricted ROM Restrictions Weight Bearing Restrictions: Yes LUE Weight Bearing: Non weight bearing RLE Weight Bearing: Weight bearing as tolerated LLE Weight Bearing: Touchdown weight bearing Other Position/Activity Restrictions: hinged knee brace and shoulder sling can be off at rest.  Pt is to maintain finger splint at all times, pt reports he took off his finger splint, unable to locate in room..   Therapy/Group: Individual Therapy  Amie Critchley 08/23/2020, 3:22 PM

## 2020-08-23 NOTE — Plan of Care (Signed)
  Problem: Consults Goal: RH GENERAL PATIENT EDUCATION Description: See Patient Education module for education specifics. Outcome: Progressing Goal: Skin Care Protocol Initiated - if Braden Score 18 or less Description: If consults are not indicated, leave blank or document N/A Outcome: Progressing   Problem: RH SKIN INTEGRITY Goal: RH STG ABLE TO PERFORM INCISION/WOUND CARE W/ASSISTANCE Description: STG Able To Perform Incision/Wound Care With mod Assistance. Outcome: Progressing   Problem: RH SAFETY Goal: RH STG ADHERE TO SAFETY PRECAUTIONS W/ASSISTANCE/DEVICE Description: STG Adhere to Safety Precautions With mod Assistance/Device. Outcome: Progressing Goal: RH STG DECREASED RISK OF FALL WITH ASSISTANCE Description: STG Decreased Risk of Fall With cues and reminders. Outcome: Progressing   Problem: RH PAIN MANAGEMENT Goal: RH STG PAIN MANAGED AT OR BELOW PT'S PAIN GOAL Description: <4 on a 0-10 pain scale. Outcome: Progressing   Problem: RH KNOWLEDGE DEFICIT GENERAL Goal: RH STG INCREASE KNOWLEDGE OF SELF CARE AFTER HOSPITALIZATION Description: Patient will demonstrate knowledge of medication management, pain management, skin/wound care, weight bearing precautions with educational materials and handouts provided by staff independently at discharge. Outcome: Progressing   
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
## 2020-08-23 NOTE — Progress Notes (Signed)
Physical Therapy Session Note  Patient Details  Name: Jake Silva MRN: [id number] Date of Birth: [birth year]  Today's Date: 08/23/2020 PT Individual Time: 1624-4695 PT Individual Time Calculation (min): 40 min   Short Term Goals: Week 1:  PT Short Term Goal 1 (Week 1): pt to demonstrate supine<>sit CGA PT Short Term Goal 2 (Week 1): pt to demonstrate functional transfers CGA PT Short Term Goal 3 (Week 1): pt to demonstrate gait 10' CGA PT Short Term Goal 4 (Week 1): pt to demonstrate WC mobility CGA 100'  Skilled Therapeutic Interventions/Progress Updates:   Pt received supine in bed and agreeable to PT. Supine>sit transfer with min  assist  for the RLE. Pt used UE to control LLE. Stand pivot transfer to [name] with PFRW with cue for WB precautions. With only mild grasp support on PFRW. Noted to have mild WB through forearm. Pt transported to rehab gym in [facility].    Seated therex: LAQ x 8 BLE, ankle DF/PF x 45 seconds, hip abduction/adduction AROM x 10 BLE, UE shoulder flexion x 10, tricep extension AROM. Instruction from PT to remain in pain free range and decreased speed of movement with eccentric movement. Pt returned to room in [name] pivot transfer to bed. With RUE on bed rail and min assist from PT. Min assist to return to supine with cues for NWB in the LUE. Pt left in bed with call bell in reach and all needs met.        Therapy Documentation Precautions:  Precautions Precautions: Fall Required Braces or Orthoses: Sling, Splint/Cast, Other Brace Splint/Cast: finger splint on L 3rd digit (unable to locate in room) Other Brace: Hinged knee brace (R) Unrestricted ROM Restrictions Weight Bearing Restrictions: Yes LUE Weight Bearing: Non weight bearing RLE Weight Bearing: Weight bearing as tolerated LLE Weight Bearing: Touchdown weight bearing Other Position/Activity Restrictions: hinged knee brace and shoulder sling can be off at rest.  Pt is to maintain finger splint at all  times, pt reports he took off his finger splint, unable to locate in room..  Pain: 6/10 pelvis, pt repostioned.    Therapy/Group: Individual Therapy  Lorie Phenix 08/23/2020, 6:39 PM

## 2020-08-24 MED ORDER — GABAPENTIN 300 MG PO CAPS
600.0000 mg | ORAL_CAPSULE | Freq: Three times a day (TID) | ORAL | Status: DC
  Administered 2020-08-24 – 2020-08-30 (×17): 600 mg via ORAL
  Filled 2020-08-24 (×19): qty 2
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
## 2020-08-24 NOTE — Progress Notes (Addendum)
Physical Therapy Session Note  Patient Details  Name: Jake Silva MRN: [id number] Date of Birth: 1981/03/14  Today's Date: 08/24/2020 PT Individual Time: 1030-1108; 1300-1400 PT Individual Time Calculation (min): 38 min , 60 min  Short Term Goals: Week 1:  PT Short Term Goal 1 (Week 1): pt to demonstrate supine<>sit CGA PT Short Term Goal 2 (Week 1): pt to demonstrate functional transfers CGA PT Short Term Goal 3 (Week 1): pt to demonstrate gait 10' CGA PT Short Term Goal 4 (Week 1): pt to demonstrate WC mobility CGA 100'  Skilled Therapeutic Interventions/Progress Updates:  Tx 1:  Pt resting in bed.  He rated pelvic pain 7/10, premedicated.  PT assisted pt with adjusting R Bledsoe brace to align iwht R knee. Pt able to state his WB precautions accurately. Finger splint already donned.  Supine> sit with supervision.  Sit> stand to PFRW with CGA.  Gait training on level tile x 50' - standing rest break, x 50' with PFRW and CGA.  Cues for sequencing.  Therapeutic exercises performed with LEs to increase strength for functional mobility: seated in w/c: 10 x 1 each : R/L long arc quad knee extensions iwht ankle pumps at end range; L heel raises; bil hip abd/adduction with minimal resistance from PT.  Wc> bed transfer with PFRW, CGA.  Sit> supine with min assist and cues for RLE.  At end of session, pt resting in bed with needs at hand and bed alarm set. Pt asked about replacing gauze bandage of splint on L finger, as it had gotten wet.  PT advised him to ask Nsg to do this.   Tx 2:  Pt resting in bed. He rated pain in pelvis 6/10, premedicated. PT adjusted Bledsoe for knee alignment. LPN stated that she would replace bandage on L finger when pt finished tx.  Supine> sit with HOB raised with supervision.  Transfer to wc to L with PFRW with close supervision.  Therapeutic exercises performed with LEs so increase strength for functional mobility: standing at counter with R forearm  support, for: 10 x 2 L hip abduction, L hamstring curls, L ankle DF.  Seated iwht R foot on basketball 15 x 1 R knee flex/extension, hip abduction/adduction and clockwise and counterclockwise circles focusing on avoiding torque at knee. In supine: R/L active assistive straight leg raises, R short arc quad knee extensions, R/L alternating ankle pumps,  Therapeutic activity seated in wc, kicking beach ball with L foot focusing on ankle DF to lift the ball off the ground as he kicked it, with improvement with practice. Sit> supine with supervision and cue to flex R knee. Supine> sit with supervision.  Stand pivot transfer with PFRW with CGA and cue to avoid wt bearing with LUE during sit> stand.  Gait training with PFRW through obstacle course  x 100' , requiring multiple bouts of sideways stepping, CGA> supervision..   At end of session, pt seated in wc with needs at hand and seat belt alarm set.     Therapy Documentation Precautions:  Precautions Precautions: Fall Required Braces or Orthoses: Sling, Splint/Cast, Other Brace Splint/Cast: finger splint on L 3rd digit (unable to locate in room) Other Brace: Hinged knee brace (R) Unrestricted ROM Restrictions Weight Bearing Restrictions: Yes LUE Weight Bearing: Non weight bearing RLE Weight Bearing: Weight bearing as tolerated LLE Weight Bearing: Touchdown weight bearing Other Position/Activity Restrictions: hinged knee brace and shoulder sling can be off at rest.  Pt is to maintain finger splint at all  times.    Therapy/Group: Individual Therapy  Anja Neuzil 08/24/2020, 12:29 PM

## 2020-08-24 NOTE — Progress Notes (Signed)
Occupational Therapy Session Note  Patient Details  Name: Jake Silva MRN: [id number] Date of Birth: 07/07/1980  Today's Date: 08/24/2020 OT Individual Time: 9741-6384 OT Individual Time Calculation (min): 57 min    Skilled Therapeutic Interventions/Progress Updates:    Pt greeted in bed, requesting to start session by completing oral care. After assisting pt with donning his Rt hinged knee brace, taught him squat pivot technique for transferring to the w/c, pt needing cues to keep Lt foot off of floor to maintain WB precautions. He did well with maintaining NWB of the Lt arm during transfer. Pt completed oral care while at the sink and during this time RN arrived to provide pain medicine. Pt requested to go outdoors during session, escorted him to the outdoor patio via w/c. Taught him how to use the reacher to doff gripper socks and one handed strategies to implement when using sock aide to don gripper socks (pt using Lt hand to thread sock onto sock aide as needed but only used the Rt hand to pull on ropes). No c/o Lt UE pain during. Pt able to exhibit carryover of AE education with mod cuing. He was then returned to the room via w/c and completed squat pivot<drop arm BSC over toilet. Education provided on lateral lean technique for lowering clothing and after bladder void pt able to elevate clothing in the same manner. Squat pivot<w/c<bed completed with CGA and vcs. He remained in bed at close of session, all needs within reach and bed alarm set.  Pt reports his bathroom at home is w/c accessible  Pt wore his finger splint throughout tx  Therapy Documentation Precautions:  Precautions Precautions: Fall Required Braces or Orthoses: Sling, Splint/Cast, Other Brace Splint/Cast: finger splint on L 3rd digit (unable to locate in room) Other Brace: Hinged knee brace (R) Unrestricted ROM Restrictions Weight Bearing Restrictions: Yes LUE Weight Bearing: Non weight bearing RLE Weight Bearing:  Weight bearing as tolerated LLE Weight Bearing: Touchdown weight bearing Other Position/Activity Restrictions: hinged knee brace and shoulder sling can be off at rest.  Pt is to maintain finger splint at all times, pt reports he took off his finger splint, unable to locate in room..   Pain: Pain Assessment Pain Scale: 0-10 Pain Score: 0-No pain ADL: ADL Eating: Set up Grooming: Setup Where Assessed-Grooming: Sitting at sink Upper Body Bathing: Supervision/safety Where Assessed-Upper Body Bathing: Sitting at sink Lower Body Bathing: Supervision/safety Where Assessed-Lower Body Bathing: Sitting at sink Upper Body Dressing: Setup Where Assessed-Upper Body Dressing: Sitting at sink Lower Body Dressing: Minimal assistance Where Assessed-Lower Body Dressing: Sitting at sink Where Assessed-Toileting: Teacher, adult education: Moderate assistance Toilet Transfer Method: Stand pivot Toilet Transfer Equipment: Bedside commode (BSC placed over toilet) Tub/Shower Transfer: Not assessed Psychologist, counselling Transfer: Not assessed     Therapy/Group: Individual Therapy  Katlin Bortner A Ahuva Poynor 08/24/2020, 12:16 PM

## 2020-08-24 NOTE — Progress Notes (Signed)
Occupational Therapy Session Note  Patient Details  Name: Jake Silva MRN: [id number] Date of Birth: [birth year]  Today's Date: 08/24/2020 OT Individual Time: 5462-7035 OT Individual Time Calculation (min): 41 min    Short Term Goals: Week 1:  OT Short Term Goal 1 (Week 1): Pt will complete functional transfers to all appropriate surfaces with CGA. OT Short Term Goal 2 (Week 1): Pt will perform all aspects of toileting with CGA while maintaining all WB precautions. OT Short Term Goal 3 (Week 1): Pt will complete LB dressing with CGA while maintaining all Wb precautions. OT Short Term Goal 4 (Week 1): Pt will complete all UB ADLs with mod I while seated in wheelchair.   Skilled Therapeutic Interventions/Progress Updates:    Pt greeted in the w/c and premedicated for pain. Session focus was placed on pt/caregiver education and d/c planning. Escorted pt down to the tub shower room and reviewed TTB transfers using either PFRW or squat pivot technique. Pt reports his bathroom is w/c accessible. After OT provided demonstrations and explanations, pts spouse Marcelino Duster had hands on practice assisting pt with transfers and setting up DME as needed. Pt able to cue spouse as well. Close supervision-CGA for squat pivot transfers with vcs for keeping Lt foot off of floor for precaution adherence. CGA for transfers using PFRW with vcs for Lt sided precaution adherence. Educated spouse on how to manage leg rests as well, Marcelino Duster having hands on practice applying leg rests to the wheelchair herself. Recommended for pt to have a TTB at home. He reports he can obtain this from a relative, has no concerns about showering at this time. Pt was then returned to the room and he assisted OT with setting up w/c for squat pivot<bed. He remained in bed at close of session, all needs within reach and bed alarm set.  Therapy Documentation Precautions:  Precautions Precautions: Fall Required Braces or Orthoses: Sling,  Splint/Cast, Other Brace Splint/Cast: finger splint on L 3rd digit (unable to locate in room) Other Brace: Hinged knee brace (R) Unrestricted ROM Restrictions Weight Bearing Restrictions: Yes LUE Weight Bearing: Non weight bearing RLE Weight Bearing: Weight bearing as tolerated LLE Weight Bearing: Touchdown weight bearing Other Position/Activity Restrictions: hinged knee brace and shoulder sling can be off at rest.  Pt is to maintain finger splint at all times, pt reports he took off his finger splint, unable to locate in room..  Vital Signs: Therapy Vitals Temp: 98.4 F (36.9 C) Temp Source: Oral Pulse Rate: 91 Resp: 19 BP: 120/77 Patient Position (if appropriate): Sitting Oxygen Therapy SpO2: 100 % O2 Device: Room Air Pain: Pain Assessment Pain Scale: 0-10 Pain Score: 7  Pain Type: Acute pain Pain Location: Leg Pain Orientation: Left;Right;Upper Pain Descriptors / Indicators: Shooting Pain Frequency: Intermittent Pain Onset: Progressive Patients Stated Pain Goal: 2 Pain Intervention(s): Medication (See eMAR) Multiple Pain Sites: No ADL: ADL Eating: Set up Grooming: Setup Where Assessed-Grooming: Sitting at sink Upper Body Bathing: Supervision/safety Where Assessed-Upper Body Bathing: Sitting at sink Lower Body Bathing: Supervision/safety Where Assessed-Lower Body Bathing: Sitting at sink Upper Body Dressing: Setup Where Assessed-Upper Body Dressing: Sitting at sink Lower Body Dressing: Minimal assistance Where Assessed-Lower Body Dressing: Sitting at sink Where Assessed-Toileting: Teacher, adult education: Moderate assistance Toilet Transfer Method: Stand pivot Toilet Transfer Equipment: Bedside commode (BSC placed over toilet) Tub/Shower Transfer: Not assessed Psychologist, counselling Transfer: Not assessed     Therapy/Group: Individual Therapy  Iridian Reader A Lillyanne Bradburn 08/24/2020, 4:01 PM
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
## 2020-08-24 NOTE — Plan of Care (Signed)
  Problem: Consults Goal: RH GENERAL PATIENT EDUCATION Description: See Patient Education module for education specifics. Outcome: Progressing Goal: Skin Care Protocol Initiated - if Braden Score 18 or less Description: If consults are not indicated, leave blank or document N/A Outcome: Progressing   Problem: RH SKIN INTEGRITY Goal: RH STG ABLE TO PERFORM INCISION/WOUND CARE W/ASSISTANCE Description: STG Able To Perform Incision/Wound Care With mod Assistance. Outcome: Progressing   Problem: RH SAFETY Goal: RH STG ADHERE TO SAFETY PRECAUTIONS W/ASSISTANCE/DEVICE Description: STG Adhere to Safety Precautions With mod Assistance/Device. Outcome: Progressing Goal: RH STG DECREASED RISK OF FALL WITH ASSISTANCE Description: STG Decreased Risk of Fall With cues and reminders. Outcome: Progressing   Problem: RH PAIN MANAGEMENT Goal: RH STG PAIN MANAGED AT OR BELOW PT'S PAIN GOAL Description: <4 on a 0-10 pain scale. Outcome: Progressing   Problem: RH KNOWLEDGE DEFICIT GENERAL Goal: RH STG INCREASE KNOWLEDGE OF SELF CARE AFTER HOSPITALIZATION Description: Patient will demonstrate knowledge of medication management, pain management, skin/wound care, weight bearing precautions with educational materials and handouts provided by staff independently at discharge. Outcome: Progressing
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
## 2020-08-25 NOTE — Progress Notes (Signed)
Occupational Therapy Session Note  Patient Details  Name: Jake Silva MRN: [id number] Date of Birth: 1980/11/25  Today's Date: 08/25/2020 OT Individual Time: 1100-1202 OT Individual Time Calculation (min): 62 min    Short Term Goals: Week 1:  OT Short Term Goal 1 (Week 1): Pt will complete functional transfers to all appropriate surfaces with CGA. OT Short Term Goal 2 (Week 1): Pt will perform all aspects of toileting with CGA while maintaining all WB precautions. OT Short Term Goal 3 (Week 1): Pt will complete LB dressing with CGA while maintaining all Wb precautions. OT Short Term Goal 4 (Week 1): Pt will complete all UB ADLs with mod I while seated in wheelchair.  Skilled Therapeutic Interventions/Progress Updates:    Patient in bed, sig other asleep on recliner.  Patient states that pain is controlled and requests to complete adl/shower tasks this session.  Sit pivot transfers to/from bed, w/c, drop arm commode, shower bench with CS.  Oral care and grooming tasks w/c level with set up.  Toileting with CS (continent of bowel and bladder), shower/bathing set up/CS, dressing set up with exception of left slipper sock (min A).  Finger and arm dressings changed.  He returned to bed at close of session, bed alarm set and call bell in hand.    Therapy Documentation Precautions:  Precautions Precautions: Fall Required Braces or Orthoses: Sling, Splint/Cast, Other Brace Splint/Cast: finger splint on L 3rd digit (unable to locate in room) Other Brace: Hinged knee brace (R) Unrestricted ROM Restrictions Weight Bearing Restrictions: Yes LUE Weight Bearing: Non weight bearing RLE Weight Bearing: Weight bearing as tolerated LLE Weight Bearing: Touchdown weight bearing Other Position/Activity Restrictions: hinged knee brace and shoulder sling can be off at rest.  Pt is to maintain finger splint at all times, pt reports he took off his finger splint, unable to locate in  room..   Therapy/Group: Individual Therapy  Barrie Lyme 08/25/2020, 7:34 AM
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
## 2020-08-26 NOTE — Progress Notes (Signed)
Pt awakening throughout the night. Medicated x2 with prn oxycodone for c/o pelvic pain-partial effects noted. Continent throughout the night.Wife at bedside.

## 2020-08-26 NOTE — Progress Notes (Signed)
Physical Therapy Session Note  Patient Details  Name: Jake Silva MRN: [id number] Date of Birth: [birth year]  Today's Date: 08/26/2020 PT Individual Time: 5035-4656 and 1300-1355 PT Individual Time Calculation (min): 60 min and 55 mins  Short Term Goals: Week 1:  PT Short Term Goal 1 (Week 1): pt to demonstrate supine<>sit CGA PT Short Term Goal 2 (Week 1): pt to demonstrate functional transfers CGA PT Short Term Goal 3 (Week 1): pt to demonstrate gait 10' CGA PT Short Term Goal 4 (Week 1): pt to demonstrate WC mobility CGA 100'  Skilled Therapeutic Interventions/Progress Updates:    pt received in bed and agreeable to therapy. Pt directed in donning B socks, shirt and brace adjustments at bed level setup A and max A for sling donning. Pt directed in supine>sit from flat bed at [facility] with use of rail. Pt directed in Stand pivot transfer min A for stability into standing, with VC for WB status. Pt requested to brush teeth prior to leaving room setup A at [facility] level. Pt then directed in attempting WC mobility and unable 2/2 pain with use of RLE to propel. Pt taken to day room total A for time and energy. PA consulted at this time for confirmation on pt's WB status, per PA and chart review there is a verbal order for  LUE WBAT and okayed to use platform walker. Pt directed in squat pivot transfer to mat table at CGA with improved technique compared to stand pivot. Pt educated in gentle stretches with emphasis on laying flat 2-3x per day for 1-2 minutes for improved hip flexion mobility without overpressure and reports he feels better "straightened out". Pt's  brace also noted to be in poor placement post transfer and repositioned completed with pt educate on placement and reported improved comfort. Pt directed in mat exercises of: heel slides, ankle pumps, LAQ educated on technique x3 each, 2x10 marches no added resistance and educated pt on scar mobilization once wound fully healed and no scabbing noted,  stretching of hip flexors in supine position to tolerance for decreased risk of contracture. Pt returned to room in [facility] total A for time and energy and requested to return to bed, squat pivot transfer no AD to bed CGA and sit>supine CGA. Pt left in bed with wife and nursing present for medications, pt handed off to nursing in good condition.   Session 2: pt received in [facility]  and agreeable to therapy. Pt taken to off unit for therapy session total A for time to outdoor courtyard, pt directed in seated BLE strengthening exercises without added resistance 2x10 hip flexion and knee extension. Pt reported greatly improved mood and comfort after being outdoors. Pt also educated on importance of recommended DME at this time for PT: manual WC and PFRW. Pt reported understanding and pt given handouts with pictures of equipment and WC handout provided for how to use. Pt also given HEP with exercises completed during session along with ankle pumps and circles. Pt able to demonstrate return of exercises with good technique. Pt returned to room total A for time. Pt requested to return to bed, CGA for squat pivot and supervision for sit>supine. All needs in reach and in good condition. Call light in hand.  And alarm set. Wife present.   Therapy Documentation Precautions:  Precautions Precautions: Fall Required Braces or Orthoses: Sling, Splint/Cast, Other Brace Splint/Cast: finger splint on L 3rd digit (unable to locate in room) Other Brace: Hinged knee brace (R) Unrestricted ROM Restrictions  Weight Bearing Restrictions: Yes LUE Weight Bearing: Weight bearing as tolerated RLE Weight Bearing: Weight bearing as tolerated LLE Weight Bearing: Touchdown weight bearing Other Position/Activity Restrictions: hinged knee brace and shoulder sling can be off at rest.  Pt is to maintain finger splint at all times, pt reports he took off his finger splint, unable to locate in room.. General:   Vital Signs:  Pain: Pain  Assessment Pain Scale: 0-10 Pain Score: 0-No pain Mobility:   Locomotion :    Trunk/Postural Assessment :    Balance:   Exercises:   Other Treatments:      Therapy/Group: Individual Therapy  Barbaraann Faster 08/26/2020, 12:20 PM

## 2020-08-26 NOTE — Progress Notes (Signed)
Occupational Therapy Session Note  Patient Details  Name: Jake Silva MRN: [id number] Date of Birth: 09-May-1980  Today's Date: 08/26/2020 OT Individual Time: 1047-1200 OT Individual Time Calculation (min): 73 min    Short Term Goals: Week 1:  OT Short Term Goal 1 (Week 1): Pt will complete functional transfers to all appropriate surfaces with CGA. OT Short Term Goal 2 (Week 1): Pt will perform all aspects of toileting with CGA while maintaining all WB precautions. OT Short Term Goal 3 (Week 1): Pt will complete LB dressing with CGA while maintaining all Wb precautions. OT Short Term Goal 4 (Week 1): Pt will complete all UB ADLs with mod I while seated in wheelchair.    Skilled Therapeutic Interventions/Progress Updates:    Pt greeted at time of session semireclined in bed resting, some pain in groin area at surgical site but no # given, pt states premedicated and had lidocaine patch. Did not interfere w/ session. Pt ambulated with PFRW bed > bathroom CGA adhering to WB precautions and transferred to toilet same manner. Walked short distance same manner toilet > TTB in shower. Toileting tasks close supervision including clothing management and posterior hygiene. Performed UB/LB bathing shower level with Supervision, provided LHS later for next shower. UB dress set up, LB dress Supervision for pants and able to don bledso brace for RLE. 1x10 of the following: standing hip flexor stretch and modified high knee for LLE. Functional mobility in room short distance with PFRW CGA room > hall approx 20 feet. Back in room in wheelchair alarm on call bell in reach.   Therapy Documentation Precautions:  Precautions Precautions: Fall Required Braces or Orthoses: Sling, Splint/Cast, Other Brace Splint/Cast: finger splint on L 3rd digit (unable to locate in room) Other Brace: Hinged knee brace (R) Unrestricted ROM Restrictions Weight Bearing Restrictions: Yes LUE Weight Bearing: Non weight  bearing RLE Weight Bearing: Weight bearing as tolerated LLE Weight Bearing: Touchdown weight bearing Other Position/Activity Restrictions: hinged knee brace and shoulder sling can be off at rest.  Pt is to maintain finger splint at all times, pt reports he took off his finger splint, unable to locate in room..      Therapy/Group: Individual Therapy  Erasmo Score 08/26/2020, 7:22 AM
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
## 2020-08-27 NOTE — Progress Notes (Signed)
Occupational Therapy Session Note  Patient Details  Name: Jake Silva MRN: [id number] Date of Birth: 1981/02/18  Today's Date: 08/27/2020 OT Individual Time: 2542-7062 OT Individual Time Calculation (min): 34 min    Short Term Goals: Week 1:  OT Short Term Goal 1 (Week 1): Pt will complete functional transfers to all appropriate surfaces with CGA. OT Short Term Goal 2 (Week 1): Pt will perform all aspects of toileting with CGA while maintaining all WB precautions. OT Short Term Goal 3 (Week 1): Pt will complete LB dressing with CGA while maintaining all Wb precautions. OT Short Term Goal 4 (Week 1): Pt will complete all UB ADLs with mod I while seated in wheelchair.  Skilled Therapeutic Interventions/Progress Updates:   Pt received in room in w/c and consented to OT tx. Session focused on standing tolerance on RLE, [place] with B hands, and BUE ROM. Pt instructed in [facility] standing task at elevated table while maintaining WB precautions to increase tolerance to standing on RLE for ADLs and functional transfers. Pt then instructed in seated BUE HEP with 3# db in RUE, no weight in LUE but instructed to move  through sam emotions bilaterally to decrease risk for contractures. Instructed in elbow flexion, shoulder flexion to 90*, and chest press for 3x15 with min cuing for proper tech with good carryover. Pt then taken back to room and requested to use bathroom. Pt req min A for toilet transfer and cuing to maintain precautions, min A to manage clothing. After tx, pt left up in w/c with seatbelt alarm on and all needs met.   Therapy Documentation Precautions:  Precautions Precautions: Fall Required Braces or Orthoses: Sling, Splint/Cast, Other Brace Splint/Cast: finger splint on L 3rd digit (unable to locate in room) Other Brace: Hinged knee brace (R) Unrestricted ROM Restrictions Weight Bearing Restrictions: Yes LUE Weight Bearing: Weight bearing as tolerated RLE Weight Bearing: Weight bearing  as tolerated LLE Weight Bearing: Touchdown weight bearing Other Position/Activity Restrictions: hinged knee brace and shoulder sling can be off at rest.  Pt is to maintain finger splint at all times, pt reports he took off his finger splint, unable to locate in room..  Pain: Pain Assessment Pain Scale: 0-10 Pain Score: 0-No pain    Therapy/Group: Individual Therapy  Deziah Renwick 08/27/2020, 12:38 PM

## 2020-08-27 NOTE — Progress Notes (Signed)
Physical Therapy Session Note  Patient Details  Name: Jake Silva MRN: [id number] Date of Birth: [birth year]  Today's Date: 08/27/2020 PT Individual Time: 0900-0930 and 1030-1130 PT Individual Time Calculation (min): 30 min and 60 mins  Short Term Goals: Week 1:  PT Short Term Goal 1 (Week 1): pt to demonstrate supine<>sit CGA PT Short Term Goal 2 (Week 1): pt to demonstrate functional transfers CGA PT Short Term Goal 3 (Week 1): pt to demonstrate gait 10' CGA PT Short Term Goal 4 (Week 1): pt to demonstrate WC mobility CGA 100'  Skilled Therapeutic Interventions/Progress Updates:    pt received in bed and agreeable to therapy. Wife present. Pt educated on positioning while in bed to allow good hip/pelvic alignment as pt reported feeling "tight" this AM in bed with pt received with knees of bed fully up and HOB fully elevated. Pt agreed and reported relief with lowering knees of bed. Pt directed in supine>sit CGA, sling donned in sitting upright max A to complete in good position. Pt then directed in squat pivot transfer no AD to WC CGA within WB status. Pt requested to wash face and brush teeth  prior to leaving room setup A at [facility] level to complete. Pt taken to gym total A in [facility] for time. Pt requested to attempt gait in PFRW, Sit to stand from [facility] CGA and directed in gait with PFRW for 35' min A for stability with frequent VC for decreased weight bearing on LLE to remain in weight bearing status pt able to complete with cues but worsened with fatigue and pt instructed to return to [facility]. Pt then educated on this and agreed/understood, directed in additional gait training with PFRW 30' and similar results though initially consistently in weight bearing status. Pt returned to room total A in WC rest of distance and requested to return to bed, CGA with squat pivot back to bed no AD, sit>supine CGA. Pt left in bed, All needs in reach and in good condition. Call light in hand.  And alarm set. Wife present.    Session 2: pt received in bed and agreeable to therapy. Pt reported "constant nagging" pain in along pubic bone and B proximal adductors. Pt declined nursing needs. Pt directed in supine>sit CGA and squat pivot CGA to WC no AD. Pt taken to gym in [facility] total A for time. Pt directed in standing activity at [facility] with PFRW to promote increased  tolerance to standing within weight bearing status  and complete reaching inside and outside BOS for duel task training with pt needed minimal VC for weight bearing status and to complete task, pt tolerated standing 2 mins, 6 mins at CGA. Pt directed in seated task with reaching outside BOS at [facility] at [facility] for improved safety and educated on reaching and not using LLE to assist in weight shifting and positioning in [facility] with reaching with good effect. Pt directed in seated RUE strengthening with 5# 2x10 bicep curls, chest press and overhead press no pain. Pt directed in PROM B adductors to pt's tolerance without pain reported and light manual work at adductor muscle belly to promote relaxation at site with good effect and pt report improved pain levels and comfort after 2x1 min each with VC for posture throughout. Pt also directed in hamstring stretching 2x74min. Pt returned to room in [facility] total A for time, requested to return to bed, CGA to supine and transfer. Pt left in bed, All needs in reach and in good condition.  Call light in hand.  And alarm set. Wife present.   Therapy Documentation Precautions:  Precautions Precautions: Fall Required Braces or Orthoses: Sling, Splint/Cast, Other Brace Splint/Cast: finger splint on L 3rd digit (unable to locate in room) Other Brace: Hinged knee brace (R) Unrestricted ROM Restrictions Weight Bearing Restrictions: Yes LUE Weight Bearing: Weight bearing as tolerated RLE Weight Bearing: Weight bearing as tolerated LLE Weight Bearing: Touchdown weight bearing Other Position/Activity Restrictions: hinged knee brace and shoulder  sling can be off at rest.  Pt is to maintain finger splint at all times, pt reports he took off his finger splint, unable to locate in room.. General:   Vital Signs: Therapy Vitals Temp: 98.6 F (37 C) Pulse Rate: 77 BP: (!) 96/55 Patient Position (if appropriate): Lying Oxygen Therapy SpO2: 100 % O2 Device: Room Air Pain:   Mobility:   Locomotion :    Trunk/Postural Assessment :    Balance:   Exercises:   Other Treatments:      Therapy/Group: Individual Therapy  Barbaraann Faster 08/27/2020, 4:06 PM
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
## 2020-08-28 NOTE — Progress Notes (Signed)
Patient ID: Jake Silva, male   DOB: [birth year], 40 y.o.   MRN: [id number]  Met with pt and wife, pt reports he has a ride for them on Friday. Have ordered equipment and match placed for medications. Work toward Friday discharge.

## 2020-08-28 NOTE — Progress Notes (Signed)
Physical Therapy Session Note  Patient Details  Name: Jake Silva MRN: [id number] Date of Birth: 1980/05/01  Today's Date: 08/28/2020 PT Individual Time: 1116-1200 and 1501-1530 PT Individual Time Calculation (min): 44 min and 29 min  Short Term Goals: Week 1:  PT Short Term Goal 1 (Week 1): pt to demonstrate supine<>sit CGA PT Short Term Goal 2 (Week 1): pt to demonstrate functional transfers CGA PT Short Term Goal 3 (Week 1): pt to demonstrate gait 10' CGA PT Short Term Goal 4 (Week 1): pt to demonstrate WC mobility CGA 100'  Skilled Therapeutic Interventions/Progress Updates:   First session:  Pt presents supine in bed but eager for therapy.  Pt transfers sup to sit w/ supervision.  Pt transfers squat pivot bed > w/c w/ supervision, but verbal cues for maintaining TDWB LLE transferring to right.  Pt donned sling for comfort at EOB.  Pt wheeled to small gym for time conservation.  Pt performed multiple sit to stand transfers to platform walker w/ supervision.  Pt amb multiple trials w/ same and CGA, but verbal cues for sequencing to maintain TDWB LLE.  Pt attempts fluid gait pattern, resulting in weight-bearing to LLE.  Pt amb 30' including safe turns to return to seat.  Pt returned to room and remained in w/c w/ chair alarm on and all needs in reach.  Second session:  Pt handed off from PT in hallway.  Pt negotiated w/c in hallways up to 150' per trial.  Pt performed sharp turns w/o c/o.  Pt negotiated cone obstacle course weaving in and out of 6 cones w/o incident.  Pt transferred w/c > mat table w/ supervision after self positioned w/c alongside mat, transferring to right.  Pt performed shooting ball into B ball hoopat B heights w/o LE support.  Pt performed catching and shooting at angles.  Pt wheeled self back to room and remained sitting in w/c.  S.O. in room at conclusion of rx.     Therapy Documentation Precautions:  Precautions Precautions: Fall Required Braces or Orthoses:  Sling, Splint/Cast, Other Brace Splint/Cast: finger splint on L 3rd digit (unable to locate in room) Other Brace: Hinged knee brace (R) Unrestricted ROM Restrictions Weight Bearing Restrictions: Yes LUE Weight Bearing: Weight bearing as tolerated RLE Weight Bearing: Weight bearing as tolerated LLE Weight Bearing: Touchdown weight bearing Other Position/Activity Restrictions: hinged knee brace and shoulder sling can be off at rest.  Pt is to maintain finger splint at all times, pt reports he took off his finger splint, unable to locate in room.. General:   Vital Signs:   Pain: Pain Assessment Pain Scale: 0-10 Pain Score: 7  Faces Pain Scale: Hurts a little bit Pain Type: Acute pain Pain Location: Pelvis Pain Descriptors / Indicators: Throbbing Pain Frequency: Intermittent Patients Stated Pain Goal: 2 PAINAD (Pain Assessment in Advanced Dementia) Breathing: normal Negative Vocalization: none Facial Expression: smiling or inexpressive Body Language: relaxed Mobility:   Locomotion :    Trunk/Postural Assessment :    Balance:   Exercises:   Other Treatments:      Therapy/Group: Individual Therapy  Lucio Edward 08/28/2020, 1:08 PM
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
## 2020-08-28 NOTE — Progress Notes (Signed)
Occupational Therapy Session Note  Patient Details  Name: Jake Silva MRN: [id number] Date of Birth: 1981-03-11  Today's Date: 08/28/2020 OT Individual Time: 5208-0223 OT Individual Time Calculation (min): 58 min    Short Term Goals: Week 1:  OT Short Term Goal 1 (Week 1): Pt will complete functional transfers to all appropriate surfaces with CGA. OT Short Term Goal 2 (Week 1): Pt will perform all aspects of toileting with CGA while maintaining all WB precautions. OT Short Term Goal 3 (Week 1): Pt will complete LB dressing with CGA while maintaining all Wb precautions. OT Short Term Goal 4 (Week 1): Pt will complete all UB ADLs with mod I while seated in wheelchair.    Skilled Therapeutic Interventions/Progress Updates:    Pt greeted at time of session semireclined in bed wanting to get up and take a shower. Pt already with R knee brace removed, educated that pt needs to have on during transfers. Placed brace back on and supine > sit Mod I, squat pivot bed > wheelchair > toilet Supervision for safety. 3/3 toileting tasks distant Supervision. Squat pivot toilet > wheelchair > TTB in shower Supervision as well before doffing clothing lateral leans and partial stand. Able to doff/don knee brace throughout session. UB/LB bathing with Set up shower level seated on bench. UB/LB dress Set up for shirt and pants, only needed Min problem solving for brace d/t clothing underneath. Stand pivot back to wheelchair Supervision, alarm on call bell in reach.    Therapy Documentation Precautions:  Precautions Precautions: Fall Required Braces or Orthoses: Sling, Splint/Cast, Other Brace Splint/Cast: finger splint on L 3rd digit (unable to locate in room) Other Brace: Hinged knee brace (R) Unrestricted ROM Restrictions Weight Bearing Restrictions: Yes LUE Weight Bearing: Weight bearing as tolerated RLE Weight Bearing: Weight bearing as tolerated LLE Weight Bearing: Touchdown weight bearing Other  Position/Activity Restrictions: hinged knee brace and shoulder sling can be off at rest.  Pt is to maintain finger splint at all times, pt reports he took off his finger splint, unable to locate in room..     Therapy/Group: Individual Therapy  Erasmo Score 08/28/2020, 7:21 AM

## 2020-08-28 NOTE — Progress Notes (Signed)
Physical Therapy Session Note  Patient Details  Name: Jake Silva MRN: [id number] Date of Birth: 07/19/1980  Today's Date: 08/28/2020 PT Individual Time: 1413-1500 PT Individual Time Calculation (min): 47 min   Short Term Goals: Week 1:  PT Short Term Goal 1 (Week 1): pt to demonstrate supine<>sit CGA PT Short Term Goal 2 (Week 1): pt to demonstrate functional transfers CGA PT Short Term Goal 3 (Week 1): pt to demonstrate gait 10' CGA PT Short Term Goal 4 (Week 1): pt to demonstrate WC mobility CGA 100'  Skilled Therapeutic Interventions/Progress Updates:    Patient in supine and reports feeling pain as noted below.  Supine to sit with S and increased time.  Patient already with Bledsoe brace on R LE and though reports has used sling some states actually using arm to propel w/c as well. Assisted in w/c to ortho gym.  Performed standing balance with PFRW and 1 UE support TDWB on L LE to tap targets at BITS numbers then letters standing over 5 minutes with intermittent CGA.  Patient standing another 3 minutes to tap random circles over screen with 1 UE support encouraging L UE use as tolerated.  Patient ambulated to mat 20' with PFRW and CGA.  Seated and supine for therex as noted below.  Completed bed mobility with CGA for LE's over edge of mat to supine.  Patient not tolerating laying flat so placed wedge under shoulders and towel roll behind R knee.  Patient assisted in w/c to room and handoff to next PT for next session.   Therapy Documentation Precautions:  Precautions Precautions: Fall Required Braces or Orthoses: Sling, Splint/Cast, Other Brace Splint/Cast: finger splint on L 3rd digit (unable to locate in room) Other Brace: Hinged knee brace (R) Unrestricted ROM Restrictions Weight Bearing Restrictions: Yes LUE Weight Bearing: Weight bearing as tolerated RLE Weight Bearing: Weight bearing as tolerated LLE Weight Bearing: Touchdown weight bearing Other Position/Activity  Restrictions: hinged knee brace and shoulder sling can be off at rest.  Pt is to maintain finger splint at all times, pt reports he took off his finger splint, unable to locate in room..  Pain: Pain Assessment Pain Score: 7  Faces Pain Scale: Hurts a little bit Pain Type: Acute pain Pain Location: Pelvis Pain Orientation: Right Pain Descriptors / Indicators: Aching Pain Onset: With Activity Pain Intervention(s): Repositioned;Rest  Exercises: General Exercises - Lower Extremity Ankle Circles/Pumps: AROM;Both;10 reps;Seated Quad Sets: Strengthening;Both;10 reps;Supine (w/ 5 sec hold) Long Arc Quad: Strengthening;Both;10 reps;Seated Heel Slides: AROM;Both;10 reps;Supine Hip ABduction/ADduction: AROM;Both;AAROM;10 reps;Supine  Therapy/Group: Individual Therapy  [name] [name], PT 08/28/2020, 2:51 PM
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
## 2020-08-29 ENCOUNTER — Other Ambulatory Visit (HOSPITAL_COMMUNITY): Payer: Self-pay

## 2020-08-29 MED ORDER — LIDOCAINE 5 % EX PTCH
1.0000 | MEDICATED_PATCH | Freq: Every day | CUTANEOUS | 0 refills | Status: DC
  Filled 2020-08-29: qty 30, 30d supply, fill #0

## 2020-08-29 MED ORDER — ACETAMINOPHEN 325 MG PO TABS: 650.0000 mg | ORAL_TABLET | Freq: Four times a day (QID) | ORAL | Status: DC | PRN

## 2020-08-29 MED ORDER — ENOXAPARIN SODIUM 40 MG/0.4ML IJ SOSY
PREFILLED_SYRINGE | INTRAMUSCULAR | 0 refills | Status: DC
  Filled 2020-08-29: qty 5.6, 14d supply, fill #0

## 2020-08-29 MED ORDER — POLYETHYLENE GLYCOL 3350 17 G PO PACK: 17.0000 g | PACK | Freq: Every day | ORAL | 0 refills | Status: DC

## 2020-08-29 MED ORDER — GABAPENTIN 300 MG PO CAPS
600.0000 mg | ORAL_CAPSULE | Freq: Three times a day (TID) | ORAL | 0 refills | Status: DC
  Filled 2020-08-29: qty 180, 30d supply, fill #0

## 2020-08-29 MED ORDER — FOLIC ACID 1 MG PO TABS
1.0000 mg | ORAL_TABLET | Freq: Every day | ORAL | 0 refills | Status: DC
  Filled 2020-08-29: qty 30, 30d supply, fill #0

## 2020-08-29 MED ORDER — VITAMIN D (ERGOCALCIFEROL) 1.25 MG (50000 UNIT) PO CAPS
50000.0000 [IU] | ORAL_CAPSULE | ORAL | 0 refills | Status: DC
  Filled 2020-08-29: qty 5, 30d supply, fill #0

## 2020-08-29 MED ORDER — DOCUSATE SODIUM 100 MG PO CAPS: 100.0000 mg | ORAL_CAPSULE | Freq: Two times a day (BID) | ORAL | 0 refills | Status: DC

## 2020-08-29 MED ORDER — OXYCODONE HCL 10 MG PO TABS
10.0000 mg | ORAL_TABLET | ORAL | 0 refills | Status: DC | PRN
  Filled 2020-08-29: qty 30, 4d supply, fill #0

## 2020-08-29 MED ORDER — ADULT MULTIVITAMIN W/MINERALS CH: 1.0000 | ORAL_TABLET | Freq: Every day | ORAL | Status: DC

## 2020-08-29 MED ORDER — METHOCARBAMOL 500 MG PO TABS
1000.0000 mg | ORAL_TABLET | Freq: Four times a day (QID) | ORAL | 0 refills | Status: DC
  Filled 2020-08-29: qty 120, 15d supply, fill #0

## 2020-08-29 MED ORDER — NAPHAZOLINE-GLYCERIN 0.012-0.25 % OP SOLN: 1.0000 [drp] | Freq: Four times a day (QID) | OPHTHALMIC | Status: DC | PRN

## 2020-08-29 NOTE — Progress Notes (Signed)
Occupational Therapy Session Note  Patient Details  Name: Jake Silva MRN: [id number] Date of Birth: 1981-02-06  Today's Date: 08/29/2020 OT Individual Time: 0814-4818 and 1410-1430 OT Individual Time Calculation (min): 57 min and 20 mins Missed 10 mins of OT   Short Term Goals: Week 1:  OT Short Term Goal 1 (Week 1): Pt will complete functional transfers to all appropriate surfaces with CGA. OT Short Term Goal 2 (Week 1): Pt will perform all aspects of toileting with CGA while maintaining all WB precautions. OT Short Term Goal 3 (Week 1): Pt will complete LB dressing with CGA while maintaining all Wb precautions. OT Short Term Goal 4 (Week 1): Pt will complete all UB ADLs with mod I while seated in wheelchair.    Skilled Therapeutic Interventions/Progress Updates:    Pt greeted at time of session semireclined in bed, pain at baseline in pelvis 2/2 fractures and did not impact treatment. Aware of grad day. Declined ADL since just completed yesterday. Supine > sit Mod I and squat pivot to wheelchair same manner. Self propel > sink and oral hygiene/face washing Mod I. RN performing med pass at this time as well. Pt verbalized frustrations with lack of equipment guaranteed since he does not have insurance and would be charity, recommended reaching out to [employer] and pt plans to. Provided hand out for DME needed in case pt does need to purchase independently. Self propel room > ADL apartment > room with Mod I and extended time. TTB transfer distant supervision for technique, pt able to manage LE's. IADL retraining in kitchen for accessing fridge from both RW and w/c level as unsure which pt will have at home with Supervision for new environment otherwise Mod I. Self propel back to room and squat pivot back to bed Mod I. Call bell in reach.   Session 2: Pt greeted at time of session semireclined in bed resting agreeable to OT session, no significant pain. Pt had new RW in room, no UE  platform attached and wanting to practice. Supine > sit Mod I and ambulated to/from bathroom same manner with new RW and good adherence to WB. Reviewing home layout, DC planning, DME for home, etc. Pt performing dressing change for gauze on 3rd digit with supervision using gauze and soft tape. Pt left reclined call bell in reach all needs met, no questions about DC.  Therapy Documentation Precautions:  Precautions Precautions: Fall Required Braces or Orthoses: Sling, Splint/Cast, Other Brace Splint/Cast: finger splint on L 3rd digit (unable to locate in room) Other Brace: Hinged knee brace (R) Unrestricted ROM Restrictions Weight Bearing Restrictions: Yes LUE Weight Bearing: Weight bearing as tolerated RLE Weight Bearing: Weight bearing as tolerated LLE Weight Bearing: Touchdown weight bearing Other Position/Activity Restrictions: hinged knee brace and shoulder sling can be off at rest.  Pt is to maintain finger splint at all times, pt reports he took off his finger splint, unable to locate in room..     Therapy/Group: Individual Therapy  Viona Gilmore 08/29/2020, 7:09 AM

## 2020-08-29 NOTE — Progress Notes (Signed)
Inpatient Rehabilitation Care Coordinator Discharge Note  The overall goal for the admission was met for:   Discharge location: Yes-GOING TO STAY WITH COUSIN-[name] AND HER BOYFRIEND  Length of Stay: Yes-9 DAYS  Discharge activity level: Yes-SUPERVISION-MOD/I LEVEL  Home/community participation: Yes  Services provided included: MD, RD, PT, OT, SLP, CM, Pharmacy, and SW  Financial Services: Other: MED PAY  Choices offered to/list presented to:PT AND WIFE  Follow-up services arranged: DME: [facility]-WHEELCHAIR AND ROLLING WALKER TO GET 3 IN 1  & TUB BENCH ON OWN and Patient/Family has no preference for HH/DME agencies HOME EXERCISE PROGRAM GIVEN TO PT COULD NOT GET HOME HEALTH DUE TO MED PAY. MATCH GIVEN FOR PRESCRIPTION COVERAGE. Pcp appointment at [facility] 7/12 @ 9:30 AM. PT LEFT WITH OUT HIS WHEELCHAIR, HAD NOT BEEN DELIVERED YET  Comments (or additional information):GIRLFRIEND/WIFE STAYED HERE WITH PT WHILE HERE AND PARTICIPATED IN HIS CARE.   Patient/Family verbalized understanding of follow-up arrangements: Yes  Individual responsible for coordination of the follow-up plan: SELF NO CELL OR PHONE EITHER DOES GIRLFRIEND   Confirmed correct DME delivered: Elease Hashimoto 08/29/2020    Elease Hashimoto

## 2020-08-29 NOTE — Progress Notes (Signed)
Physical Therapy Discharge Summary  Patient Details  Name: Jake Silva MRN: [id number] Date of Birth: [birth year]  Today's Date: 08/29/2020 PT Individual Time: 0930-1025 PT Individual Time Calculation (min): 55 min    Patient has met 10 of 10 long term goals due to improved activity tolerance, improved balance, improved postural control, increased strength, ability to compensate for deficits, and functional use of  right lower extremity and left lower extremity.  Patient to discharge at a wheelchair level Supervision.   Patient's care partner is independent to provide the necessary physical assistance at discharge.  Reasons goals not met: N/A  Recommendation:  Patient will benefit from ongoing skilled PT services in home health setting to continue to advance safe functional mobility, address ongoing impairments in Strength, balance, coordination, functional mobility, and minimize fall risk.  Equipment: Rolling walker and wheelchair  Reasons for discharge: discharge from hospital  Patient/family agrees with progress made and goals achieved: Yes  PT Discharge Precautions/Restrictions Precautions Precautions: Fall Required Braces or Orthoses: Sling;Splint/Cast;Other Brace Splint/Cast: finger splint on L 3rd digit Other Brace: Hinged knee brace (R) Unrestricted ROM Restrictions Weight Bearing Restrictions: Yes LUE Weight Bearing: Weight bearing as tolerated RLE Weight Bearing: Weight bearing as tolerated LLE Weight Bearing: Touchdown weight bearing Other Position/Activity Restrictions: hinged knee brace and shoulder sling PRN but now WBAT, Pt is to maintain finger splint at all times Vital Signs   Pain Pain Assessment Pain Score: 0-No pain Faces Pain Scale: No hurt Vision/Perception  Perception Perception: Within Functional Limits Praxis Praxis: Intact  Cognition Overall Cognitive Status: Within Functional Limits for tasks assessed Arousal/Alertness:  Awake/alert Orientation Level: Oriented X4 Attention: Focused;Sustained Focused Attention: Appears intact Sustained Attention: Appears intact Executive Function: Reasoning;Sequencing Reasoning: Appears intact Sequencing: Appears intact Safety/Judgment: Appears intact Sensation Sensation Light Touch: Appears Intact Hot/Cold: Appears Intact Proprioception: Appears Intact Stereognosis: Appears Intact Coordination Gross Motor Movements are Fluid and Coordinated: No Fine Motor Movements are Fluid and Coordinated: Yes Motor  Motor Motor: Within Functional Limits Motor - Skilled Clinical Observations: limited 2/2 pain and injuries at times but [facility] for ADL  Mobility Bed Mobility Bed Mobility: Sit to Sidelying Right;Right Sidelying to Sit;Sit to Supine;Supine to Sit Right Sidelying to Sit: Independent with assistive device Supine to Sit: Independent with assistive device Sit to Supine: Independent with assistive device Sit to Sidelying Right: Independent with assistive device Transfers Transfers: Stand Pivot Transfers;Sit to Stand;Stand to Sit Sit to Stand: Independent with assistive device Stand to Sit: Independent with assistive device Stand Pivot Transfers: Supervision/Verbal cueing Transfer (Assistive device): Rolling walker Locomotion  Gait Ambulation: Yes Gait Assistance: Independent with assistive device Gait Distance (Feet): 65 Feet Assistive device: Rolling walker Gait Assistance Details: Verbal cues for precautions/safety Gait Gait: Yes Gait Pattern: Impaired Gait Pattern:  (with hopping technique) Stairs / Additional Locomotion Stairs: Yes Stairs Assistance: Moderate Assistance - Patient 50 - 74% Stair Management Technique: No rails;Other (comment) (HHA) Number of Stairs: 2 Height of Stairs: 3 Ramp: Supervision/Verbal cueing (in wheelchair) Wheelchair Mobility Wheelchair Mobility: Yes Wheelchair Assistance: Independent with Garment/textile technologist: Both upper extremities Wheelchair Parts Management: Independent Distance: 150  Trunk/Postural Assessment  Cervical Assessment Cervical Assessment: Within Functional Limits Thoracic Assessment Thoracic Assessment: Within Functional Limits Lumbar Assessment Lumbar Assessment: Within Functional Limits Postural Control Postural Control: Within Functional Limits  Balance Balance Balance Assessed: Yes Static Sitting Balance Static Sitting - Balance Support: No upper extremity supported Static Sitting - Level of Assistance: 7: Independent Dynamic Sitting Balance Dynamic Sitting - Balance  Support: No upper extremity supported;During functional activity Dynamic Sitting - Level of Assistance: 6: Modified independent (Device/Increase time) Static Standing Balance Static Standing - Balance Support: During functional activity;Right upper extremity supported Static Standing - Level of Assistance: 6: Modified independent (Device/Increase time) Extremity Assessment  RUE Assessment RUE Assessment: Within Functional Limits LUE Assessment LUE Assessment: Exceptions to [facility] Active Range of Motion (AROM) Comments: AROM WFL General Strength Comments: DNT strength d/t injuries, WBAT with light activity, WFL for ADL RLE Assessment RLE Assessment: Within Functional Limits LLE Assessment LLE Assessment: Exceptions to [facility] General Strength Comments: Grossly 4+, limited by pain    Mickel Fuchs 08/29/2020, 2:44 PM

## 2020-08-29 NOTE — Progress Notes (Signed)
Occupational Therapy Discharge Summary  Patient Details  Name: Jake Silva MRN: [id number] Date of Birth: 30-Oct-1980   Patient has met 9 of 9 long term goals due to improved activity tolerance, improved balance, postural control, ability to compensate for deficits, improved awareness, and improved coordination.  Patient to discharge at overall Modified Independent level.  Patient's care partner is independent to provide the necessary physical assistance at discharge.  Pt is Mod I with BADLs, going home with significant other [name] and cousin/family support for transportation and IADLs. Pt has demonstrated BADLs at wheelchair and RW level with good adherence to WB precautions but does need cues at times to wear RLE knee brace.   Reasons goals not met: na  Recommendation:  Patient will benefit from ongoing skilled OT services in home health setting to continue to advance functional skills in the area of iADL and Reduce care partner burden.  Equipment: Recommended TTB, hand out provided  Reasons for discharge: treatment goals met and discharge from hospital  Patient/family agrees with progress made and goals achieved: Yes  OT Discharge Precautions/Restrictions  Precautions Precautions: Fall Required Braces or Orthoses: Sling;Splint/Cast;Other Brace Splint/Cast: finger splint on L 3rd digit Other Brace: Hinged knee brace (R) Unrestricted ROM Restrictions Weight Bearing Restrictions: Yes LUE Weight Bearing: Weight bearing as tolerated RLE Weight Bearing: Weight bearing as tolerated LLE Weight Bearing: Touchdown weight bearing Other Position/Activity Restrictions: hinged knee brace and shoulder sling PRN but now WBAT, Pt is to maintain finger splint at all times Pain Pain Assessment Pain Scale: 0-10 Pain Score: 3  ADL ADL Eating: Independent Grooming: Modified independent Where Assessed-Grooming: Sitting at sink Upper Body Bathing: Setup Where Assessed-Upper Body Bathing:  Sitting at sink Lower Body Bathing: Setup Where Assessed-Lower Body Bathing: Sitting at sink Upper Body Dressing: Independent Where Assessed-Upper Body Dressing: Sitting at sink Lower Body Dressing: Modified independent Where Assessed-Lower Body Dressing: Sitting at sink Toileting: Modified independent Where Assessed-Toileting: Risk analyst Method: Arts development officer: Bedside commode (BSC placed over toilet) Tub/Shower Transfer: Distant supervision Social research officer, government: Not assessed Vision Baseline Vision/History: No visual deficits Patient Visual Report: No change from baseline Perception  Perception: Within Functional Limits Praxis Praxis: Intact Cognition Overall Cognitive Status: Within Functional Limits for tasks assessed Arousal/Alertness: Awake/alert Orientation Level: Oriented X4 Focused Attention: Appears intact Sustained Attention: Appears intact Memory: Appears intact Awareness: Appears intact Problem Solving: Appears intact Safety/Judgment: Appears intact Sensation Sensation Light Touch: Appears Intact Proprioception: Appears Intact Coordination Gross Motor Movements are Fluid and Coordinated: No Fine Motor Movements are Fluid and Coordinated: No (limted by R hand 3rd digit) Coordination and Movement Description: limited [place] d/t injuries and WB status Motor  Motor Motor: Within Functional Limits Motor - Skilled Clinical Observations: limited 2/2 pain and injuries at times but [facility] for ADL Mobility  Bed Mobility Bed Mobility: Sit to Sidelying Right;Right Sidelying to Sit;Sit to Supine;Supine to Sit Right Sidelying to Sit: Independent with assistive device Supine to Sit: Independent with assistive device Sit to Supine: Independent with assistive device Sit to Sidelying Right: Independent with assistive device Transfers Sit to Stand: Independent with assistive device Stand to Sit:  Independent with assistive device  Trunk/Postural Assessment  Cervical Assessment Cervical Assessment: Within Functional Limits Thoracic Assessment Thoracic Assessment: Exceptions to [facility] Lumbar Assessment Lumbar Assessment: Within Functional Limits Postural Control Postural Control: Within Functional Limits  Balance Balance Balance Assessed: Yes Static Sitting Balance Static Sitting - Balance Support: No upper extremity supported Static Sitting -  Level of Assistance: 7: Independent Dynamic Sitting Balance Dynamic Sitting - Balance Support: No upper extremity supported;During functional activity Dynamic Sitting - Level of Assistance: 6: Modified independent (Device/Increase time) Static Standing Balance Static Standing - Balance Support: During functional activity;Right upper extremity supported Static Standing - Level of Assistance: 6: Modified independent (Device/Increase time) Extremity/Trunk Assessment RUE Assessment RUE Assessment: Within Functional Limits LUE Assessment LUE Assessment: Exceptions to [facility] Active Range of Motion (AROM) Comments: AROM WFL General Strength Comments: DNT strength d/t injuries, WBAT with light activity, WFL for ADL   Viona Gilmore 08/29/2020, 9:31 AM

## 2020-08-29 NOTE — Progress Notes (Signed)
Physical Therapy Session Note  Patient Details  Name: Jake Silva MRN: [id number] Date of Birth: 21-Dec-1980  Today's Date: 08/29/2020 PT Individual Time: 0930-1025 PT Individual Time Calculation (min): 55 min   Short Term Goals: Week 1:  PT Short Term Goal 1 (Week 1): pt to demonstrate supine<>sit CGA PT Short Term Goal 2 (Week 1): pt to demonstrate functional transfers CGA PT Short Term Goal 3 (Week 1): pt to demonstrate gait 10' CGA PT Short Term Goal 4 (Week 1): pt to demonstrate WC mobility CGA 100' Week 2:     Skilled Therapeutic Interventions/Progress Updates:    Pt received in bed and agreeable to therapy without complaint of pain. Pt directed in bed mobility w/o use of bed features to simulate home environment. Pt directed in mod I squat pivot transfer to wheelchair and propelled with BUE x 150 ft. Pt demonstrated wheelchair part management and was able to position wheelchair for transfer. Car transfer with supervision and verbal cues for hand placement and WB precautions. Pt directed in gait with rolling walker x 65 ft with verbal cues to maintain WB status. Pt fatigued quickly due to hopping technique. Pt instructed and performed two 3 in steps with hand held assist and hopping technique. Pt was provided HEP and theraband, reviewed exercises and pt verbalized understanding. Pt left seated in wheelchair with all needs in reach and in good condition.   Therapy Documentation Precautions:  Precautions Precautions: Fall Required Braces or Orthoses: Sling, Splint/Cast, Other Brace Splint/Cast: finger splint on L 3rd digit Other Brace: Hinged knee brace (R) Unrestricted ROM Restrictions Weight Bearing Restrictions: Yes LUE Weight Bearing: Weight bearing as tolerated RLE Weight Bearing: Weight bearing as tolerated LLE Weight Bearing: Touchdown weight bearing Other Position/Activity Restrictions: hinged knee brace and shoulder sling PRN but now WBAT, Pt is to maintain finger splint  at all times General:   Vital Signs:   Pain: Pain Assessment Pain Score: 0-No pain Faces Pain Scale: No hurt Mobility: Bed Mobility Bed Mobility: Sit to Sidelying Right;Right Sidelying to Sit;Sit to Supine;Supine to Sit Right Sidelying to Sit: Independent with assistive device Supine to Sit: Independent with assistive device Sit to Supine: Independent with assistive device Sit to Sidelying Right: Independent with assistive device Transfers Transfers: Stand Pivot Transfers;Sit to Stand;Stand to Sit Sit to Stand: Independent with assistive device Stand to Sit: Independent with assistive device Stand Pivot Transfers: Supervision/Verbal cueing Transfer (Assistive device): Rolling walker Locomotion : Gait Ambulation: Yes Gait Assistance: Independent with assistive device Gait Distance (Feet): 65 Feet Assistive device: Rolling walker Gait Assistance Details: Verbal cues for precautions/safety Gait Gait: Yes Gait Pattern: Impaired Gait Pattern:  (with hopping technique) Stairs / Additional Locomotion Stairs: Yes Stairs Assistance: Moderate Assistance - Patient 50 - 74% Stair Management Technique: No rails;Other (comment) (HHA) Number of Stairs: 2 Height of Stairs: 3 Ramp: Supervision/Verbal cueing (in wheelchair) Wheelchair Mobility Wheelchair Mobility: Yes Wheelchair Assistance: Independent with Scientist, research (life sciences): Both upper extremities Wheelchair Parts Management: Independent Distance: 150  Trunk/Postural Assessment : Cervical Assessment Cervical Assessment: Within Functional Limits Thoracic Assessment Thoracic Assessment: Within Functional Limits Lumbar Assessment Lumbar Assessment: Within Functional Limits Postural Control Postural Control: Within Functional Limits  Balance: Balance Balance Assessed: Yes Static Sitting Balance Static Sitting - Balance Support: No upper extremity supported Static Sitting - Level of Assistance: 7:  Independent Dynamic Sitting Balance Dynamic Sitting - Balance Support: No upper extremity supported;During functional activity Dynamic Sitting - Level of Assistance: 6: Modified independent (Device/Increase time) Static Standing  Balance Static Standing - Balance Support: During functional activity;Right upper extremity supported Static Standing - Level of Assistance: 6: Modified independent (Device/Increase time) Exercises:   Other Treatments:      Therapy/Group: Individual Therapy  Juluis Rainier, PT, DPT 08/29/2020, 2:49 PM

## 2020-08-29 NOTE — Progress Notes (Signed)
Occupational Therapy Session Note  Patient Details  Name: Jake Silva MRN: [id number] Date of Birth: Nov 23, 1980  Today's Date: 08/29/2020 OT Individual Time: 4037-0964 OT Individual Time Calculation (min): 59 min    Short Term Goals: Week 1:  OT Short Term Goal 1 (Week 1): Pt will complete functional transfers to all appropriate surfaces with CGA. OT Short Term Goal 2 (Week 1): Pt will perform all aspects of toileting with CGA while maintaining all WB precautions. OT Short Term Goal 3 (Week 1): Pt will complete LB dressing with CGA while maintaining all Wb precautions. OT Short Term Goal 4 (Week 1): Pt will complete all UB ADLs with mod I while seated in wheelchair.  Skilled Therapeutic Interventions/Progress Updates:    Pt received in w/c and room and consented to OT tx. Pt requested to practice putting on brace over thicker pants (cargo pants) to see how it felt. Pt donned cargo pants with setup and cuing for safety during standing, donned brace with mod I using bed to prop up RLE to don. Pt required increased time for task for proficiency. Pt's new RW dropped off during session, adjusted to correct height and taken down to therapy gym to practice functional mobility with item retrieval form various surfaces and cuing to maintain precautions with good carryover. Pt then instructed in seated BUE exercises with to weight in LUE, 3#db in RUE. Exercises included elbow flexion, shoulder press, chest press, shoulder flexion, and shoulder abduction all for 3x15 with min cuing for proper tech with good carryover. Pt required frequent rest breaks due to fatigue. Pt able to maintain all precautions during session. After tx, pt helped back to room, left in w/c with seatbelt alarm on and all needs met.   Therapy Documentation Precautions:  Precautions Precautions: Fall Required Braces or Orthoses: Sling, Splint/Cast, Other Brace Splint/Cast: finger splint on L 3rd digit Other Brace: Hinged knee  brace (R) Unrestricted ROM Restrictions Weight Bearing Restrictions: Yes LUE Weight Bearing: Weight bearing as tolerated RLE Weight Bearing: Weight bearing as tolerated LLE Weight Bearing: Touchdown weight bearing Other Position/Activity Restrictions: hinged knee brace and shoulder sling PRN but now WBAT, Pt is to maintain finger splint at all times  Pain: Pain Assessment Pain Scale: 0-10 Pain Score: 3  ADL: ADL Eating: Independent Grooming: Modified independent Where Assessed-Grooming: Sitting at sink Upper Body Bathing: Setup Where Assessed-Upper Body Bathing: Sitting at sink Lower Body Bathing: Setup Where Assessed-Lower Body Bathing: Sitting at sink Upper Body Dressing: Independent Where Assessed-Upper Body Dressing: Sitting at sink Lower Body Dressing: Modified independent Where Assessed-Lower Body Dressing: Sitting at sink Toileting: Modified independent Where Assessed-Toileting: Glass blower/designer: Modified Programmer, applications Method: Arts development officer: Bedside commode (BSC placed over toilet) Tub/Shower Transfer: Distant supervision Social research officer, government: Not assessed Vision Baseline Vision/History: No visual deficits Patient Visual Report: No change from baseline Perception  Perception: Within Functional Limits Praxis Praxis: Intact   Therapy/Group: Individual Therapy  Terianna Peggs 08/29/2020, 11:36 AM
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
## 2020-08-30 ENCOUNTER — Other Ambulatory Visit (HOSPITAL_COMMUNITY): Payer: Self-pay

## 2020-08-30 NOTE — Progress Notes (Signed)
Patient discharged off of unit with all belongings. Discharge papers/instructions explained by physician assistant to patient. Patient has no further questions at time of discharge. No complications noted at this time.  Curly Rim.
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
## 2020-09-09 ENCOUNTER — Encounter: Payer: Self-pay | Attending: Physical Medicine and Rehabilitation | Admitting: Physical Medicine and Rehabilitation

## 2020-09-10 ENCOUNTER — Other Ambulatory Visit (HOSPITAL_COMMUNITY): Payer: Self-pay

## 2020-09-19 ENCOUNTER — Emergency Department (HOSPITAL_COMMUNITY): Payer: Self-pay

## 2020-09-19 ENCOUNTER — Emergency Department (HOSPITAL_COMMUNITY)
Admission: EM | Admit: 2020-09-19 | Discharge: 2020-09-19 | Disposition: A | Discharge status: Home / Self Care | Payer: Self-pay | Attending: Physician Assistant | Admitting: Physician Assistant

## 2020-09-19 ENCOUNTER — Encounter (HOSPITAL_COMMUNITY): Payer: Self-pay | Admitting: Emergency Medicine

## 2020-09-19 DIAGNOSIS — M25551 Pain in right hip: Secondary | ICD-10-CM | POA: Insufficient documentation

## 2020-09-19 DIAGNOSIS — M25552 Pain in left hip: Secondary | ICD-10-CM | POA: Insufficient documentation

## 2020-09-19 DIAGNOSIS — R1030 Lower abdominal pain, unspecified: Secondary | ICD-10-CM | POA: Insufficient documentation

## 2020-09-19 DIAGNOSIS — M25561 Pain in right knee: Secondary | ICD-10-CM | POA: Insufficient documentation

## 2020-09-19 DIAGNOSIS — M25562 Pain in left knee: Secondary | ICD-10-CM | POA: Insufficient documentation

## 2020-09-19 DIAGNOSIS — Z5321 Procedure and treatment not carried out due to patient leaving prior to being seen by health care provider: Secondary | ICD-10-CM | POA: Insufficient documentation
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
## 2020-09-19 NOTE — ED Notes (Signed)
Called name for last call and no response again

## 2020-09-19 NOTE — ED Notes (Signed)
Pt called 3x no answer  
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
## 2020-09-23 NOTE — Progress Notes (Signed)
Patient did not show for appointment.   

## 2020-09-24 ENCOUNTER — Encounter: Payer: Self-pay | Admitting: Family

## 2021-05-09 ENCOUNTER — Emergency Department (HOSPITAL_COMMUNITY): Payer: Self-pay

## 2021-05-09 ENCOUNTER — Other Ambulatory Visit: Payer: Self-pay

## 2021-05-09 ENCOUNTER — Encounter (HOSPITAL_COMMUNITY): Payer: Self-pay | Admitting: Emergency Medicine

## 2021-05-09 ENCOUNTER — Emergency Department (HOSPITAL_COMMUNITY)
Admission: EM | Admit: 2021-05-09 | Discharge: 2021-05-09 | Disposition: A | Discharge status: Home / Self Care | Payer: Self-pay | Attending: Emergency Medicine | Admitting: Emergency Medicine

## 2021-05-09 DIAGNOSIS — M25552 Pain in left hip: Secondary | ICD-10-CM | POA: Insufficient documentation

## 2021-05-09 DIAGNOSIS — S0181XA Laceration without foreign body of other part of head, initial encounter: Secondary | ICD-10-CM | POA: Insufficient documentation

## 2021-05-09 DIAGNOSIS — Z5900 Homelessness unspecified: Secondary | ICD-10-CM | POA: Insufficient documentation

## 2021-05-09 DIAGNOSIS — Z202 Contact with and (suspected) exposure to infections with a predominantly sexual mode of transmission: Secondary | ICD-10-CM | POA: Insufficient documentation

## 2021-05-09 LAB — RPR: RPR Ser Ql: NONREACTIVE

## 2021-05-09 LAB — HIV ANTIBODY (ROUTINE TESTING W REFLEX): HIV Screen 4th Generation wRfx: NONREACTIVE

## 2021-05-09 MED ORDER — DOXYCYCLINE HYCLATE 100 MG PO TABS
100.0000 mg | ORAL_TABLET | Freq: Once | ORAL | Status: AC
  Administered 2021-05-09: 100 mg via ORAL
  Filled 2021-05-09: qty 1

## 2021-05-09 MED ORDER — CEFTRIAXONE SODIUM 500 MG IJ SOLR
500.0000 mg | Freq: Once | INTRAMUSCULAR | Status: AC
  Administered 2021-05-09: 500 mg via INTRAMUSCULAR
  Filled 2021-05-09: qty 500

## 2021-05-09 MED ORDER — DOXYCYCLINE HYCLATE 100 MG PO CAPS: 100.0000 mg | ORAL_CAPSULE | Freq: Two times a day (BID) | ORAL | 0 refills | Status: AC
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
## 2021-05-09 NOTE — ED Triage Notes (Signed)
Pt brought to ED by [facility] with c/o of bilateral hip pain that radiates down left leg x1 year. Denies hx of sciatica.

## 2021-05-09 NOTE — Discharge Instructions (Signed)
You are seen in the emergency room today with leg pain and leg giving out along with some other symptoms.  I am treating you with antibiotics for your concerns that we spoke about.  You will need to complete the week of antibiotic pills before you are clear of infection.  The x-rays of your hip look similar to prior.  I have listed the name for the orthopedic doctor who performed your surgery.   Continue your home medications and return with any new or suddenly worsening symptoms.

## 2021-09-27 ENCOUNTER — Emergency Department (HOSPITAL_COMMUNITY): Payer: Self-pay

## 2021-09-27 ENCOUNTER — Emergency Department (HOSPITAL_COMMUNITY)
Admission: EM | Admit: 2021-09-27 | Discharge: 2021-09-27 | Disposition: A | Discharge status: Home / Self Care | Payer: Self-pay | Attending: Emergency Medicine | Admitting: Emergency Medicine

## 2021-09-27 ENCOUNTER — Encounter (HOSPITAL_COMMUNITY): Payer: Self-pay

## 2021-09-27 DIAGNOSIS — S0181XA Laceration without foreign body of other part of head, initial encounter: Secondary | ICD-10-CM

## 2021-09-27 DIAGNOSIS — S0240DA Maxillary fracture, left side, initial encounter for closed fracture: Secondary | ICD-10-CM | POA: Insufficient documentation

## 2021-09-27 DIAGNOSIS — Z23 Encounter for immunization: Secondary | ICD-10-CM | POA: Insufficient documentation

## 2021-09-27 DIAGNOSIS — Z20822 Contact with and (suspected) exposure to covid-19: Secondary | ICD-10-CM | POA: Insufficient documentation

## 2021-09-27 LAB — RESP PANEL BY RT-PCR (FLU A&B, COVID) ARPGX2
Influenza A by PCR: NEGATIVE
Influenza B by PCR: NEGATIVE
SARS Coronavirus 2 by RT PCR: NEGATIVE

## 2021-09-27 LAB — COMPREHENSIVE METABOLIC PANEL
ALT: 15 U/L (ref 0–44)
AST: 26 U/L (ref 15–41)
Albumin: 4 g/dL (ref 3.5–5.0)
Alkaline Phosphatase: 72 U/L (ref 38–126)
Anion gap: 13 (ref 5–15)
BUN: 8 mg/dL (ref 6–20)
CO2: 19 mmol/L — ABNORMAL LOW (ref 22–32)
Calcium: 8.3 mg/dL — ABNORMAL LOW (ref 8.9–10.3)
Chloride: 107 mmol/L (ref 98–111)
Creatinine, Ser: 0.96 mg/dL (ref 0.61–1.24)
GFR, Estimated: >60 mL/min (ref >60)
Glucose, Bld: 82 mg/dL (ref 70–99)
Potassium: 4 mmol/L (ref 3.5–5.1)
Sodium: 139 mmol/L (ref 135–145)
Total Bilirubin: 0.4 mg/dL (ref 0.3–1.2)
Total Protein: 6.9 g/dL (ref 6.5–8.1)

## 2021-09-27 LAB — CBC
HCT: 38.1 % — ABNORMAL LOW (ref 39.0–52.0)
Hemoglobin: 12.8 g/dL — ABNORMAL LOW (ref 13.0–17.0)
MCH: 31.8 pg (ref 26.0–34.0)
MCHC: 33.6 g/dL (ref 30.0–36.0)
MCV: 94.5 fL (ref 80.0–100.0)
Platelets: 247 10*3/uL (ref 150–400)
RBC: 4.03 MIL/uL — ABNORMAL LOW (ref 4.22–5.81)
RDW: 13.6 % (ref 11.5–15.5)
WBC: 6.9 10*3/uL (ref 4.0–10.5)
nRBC: 0 % (ref 0.0–0.2)

## 2021-09-27 LAB — SAMPLE TO BLOOD BANK

## 2021-09-27 LAB — I-STAT CHEM 8, ED
BUN: 8 mg/dL (ref 6–20)
Calcium, Ion: 0.97 mmol/L — ABNORMAL LOW (ref 1.15–1.40)
Chloride: 106 mmol/L (ref 98–111)
Creatinine, Ser: 1.2 mg/dL (ref 0.61–1.24)
Glucose, Bld: 80 mg/dL (ref 70–99)
HCT: 38 % — ABNORMAL LOW (ref 39.0–52.0)
Hemoglobin: 12.9 g/dL — ABNORMAL LOW (ref 13.0–17.0)
Potassium: 3.9 mmol/L (ref 3.5–5.1)
Sodium: 140 mmol/L (ref 135–145)
TCO2: 19 mmol/L — ABNORMAL LOW (ref 22–32)

## 2021-09-27 LAB — PROTIME-INR
INR: 0.9 (ref 0.8–1.2)
Prothrombin Time: 12.5 seconds (ref 11.4–15.2)

## 2021-09-27 LAB — LACTIC ACID, PLASMA: Lactic Acid, Venous: 3 mmol/L (ref 0.5–1.9)

## 2021-09-27 MED ORDER — LIDOCAINE-EPINEPHRINE (PF) 2 %-1:200000 IJ SOLN
10.0000 mL | Freq: Once | INTRAMUSCULAR | Status: AC
  Administered 2021-09-27: 10 mL
  Filled 2021-09-27: qty 20

## 2021-09-27 MED ORDER — OXYCODONE-ACETAMINOPHEN 5-325 MG PO TABS
2.0000 | ORAL_TABLET | Freq: Once | ORAL | Status: AC
  Administered 2021-09-27: 2 via ORAL
  Filled 2021-09-27: qty 2

## 2021-09-27 MED ORDER — IBUPROFEN 400 MG PO TABS
600.0000 mg | ORAL_TABLET | Freq: Once | ORAL | Status: AC
  Administered 2021-09-27: 600 mg via ORAL
  Filled 2021-09-27: qty 1

## 2021-09-27 MED ORDER — TETANUS-DIPHTH-ACELL PERTUSSIS 5-2.5-18.5 LF-MCG/0.5 IM SUSY
0.5000 mL | PREFILLED_SYRINGE | Freq: Once | INTRAMUSCULAR | Status: AC
  Administered 2021-09-27: 0.5 mL via INTRAMUSCULAR
  Filled 2021-09-27: qty 0.5

## 2021-09-27 NOTE — ED Notes (Signed)
Patient transported to CT 
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
## 2021-09-27 NOTE — Discharge Instructions (Addendum)
Your x-rays, CT scans did not show any acute fractures.  Your laceration to left side of your face.  This was repaired with 3 stitches.  Please have sutures removed in 7 days.  You can follow-up with your primary care provider or urgent care to have these taken out or return to the emergency room.  Take Tylenol and ibuprofen as you need to for pain.

## 2021-09-27 NOTE — ED Provider Notes (Cosign Needed Addendum)
Patient clinically sober.  Laceration repaired.  Imaging without acute fractures.  Patient is appropriate for discharge.  [name].Laceration Repair  Date/Time: 09/27/2021 4:58 PM  Performed by: Marita Kansas, PA-C Authorized by: Marita Kansas, PA-C   Consent:    Consent obtained:  Verbal   Consent given by:  Patient   Risks discussed:  Infection, need for additional repair, pain, poor cosmetic result and poor wound healing   Alternatives discussed:  No treatment and delayed treatment Universal protocol:    Procedure explained and questions answered to patient or proxy's satisfaction: yes     Relevant documents present and verified: yes     Test results available: yes     Imaging studies available: yes     Patient identity confirmed:  Verbally with patient Anesthesia:    Anesthesia method:  Local infiltration   Local anesthetic:  Lidocaine 2% WITH epi (0.5 ml used) Laceration details:    Location:  Face   Face location:  L cheek   Length (cm):  2.5 Pre-procedure details:    Preparation:  Patient was prepped and draped in usual sterile fashion Treatment:    Area cleansed with:  Povidone-iodine   Amount of cleaning:  Standard   Irrigation solution:  Sterile saline   Irrigation volume:  250 Skin repair:    Repair method:  Sutures   Suture size:  5-0   Suture material:  Prolene   Suture technique:  Simple interrupted   Number of sutures:  3 Approximation:    Approximation:  Close Repair type:    Repair type:  Simple Post-procedure details:    Dressing:  Open (no dressing)   Procedure completion:  Tolerated well, no immediate complications     Marita Kansas, PA-C 09/27/21 1659    Marita Kansas, PA-C 09/27/21 1708    Blane Ohara, MD 09/28/21 0025

## 2021-09-27 NOTE — ED Notes (Signed)
Pt verbalized understanding of d/c instructions, meds, and followup care. Denies questions. VSS, no distress noted. Steady gait to exit with all belongings.  ?

## 2021-11-19 ENCOUNTER — Other Ambulatory Visit: Payer: Self-pay

## 2021-11-19 ENCOUNTER — Encounter (HOSPITAL_COMMUNITY): Payer: Self-pay | Admitting: Emergency Medicine

## 2021-11-19 ENCOUNTER — Emergency Department (HOSPITAL_COMMUNITY)
Admission: EM | Admit: 2021-11-19 | Discharge: 2021-11-19 | Disposition: A | Discharge status: Home / Self Care | Payer: Self-pay | Attending: Emergency Medicine | Admitting: Emergency Medicine

## 2021-11-19 DIAGNOSIS — F10920 Alcohol use, unspecified with intoxication, uncomplicated: Secondary | ICD-10-CM | POA: Insufficient documentation
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
## 2022-02-25 ENCOUNTER — Other Ambulatory Visit: Payer: Self-pay

## 2022-04-20 IMAGING — CT CT PELVIS W/O CM
2 of 5 series · 15 of 46 positions shown, 17 images · non-contrast
Comparison: CT 08/11/2020, x-ray 08/11/2020

CLINICAL DATA: Pelvic and groin pain. Two days postoperative pelvic
fracture ORIF

EXAM:
CT PELVIS WITHOUT CONTRAST
TECHNIQUE: Multidetector CT imaging of the pelvis was performed following the
standard protocol without intravenous contrast. Metal artifact
reduction protocol was utilized.

[Series 3: (person_name) · coronal · 0.44mm/px · 3 of 114 slices shown]
[im 29/114  soft-tissue]
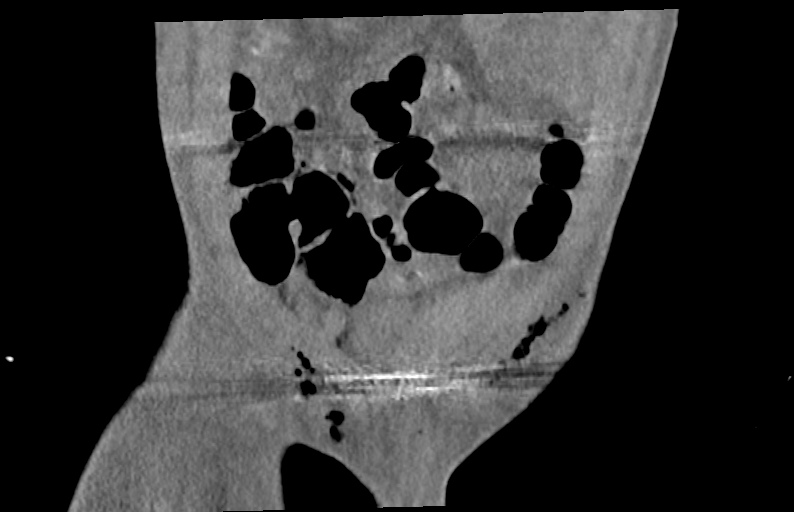
[im 57/114  soft-tissue]
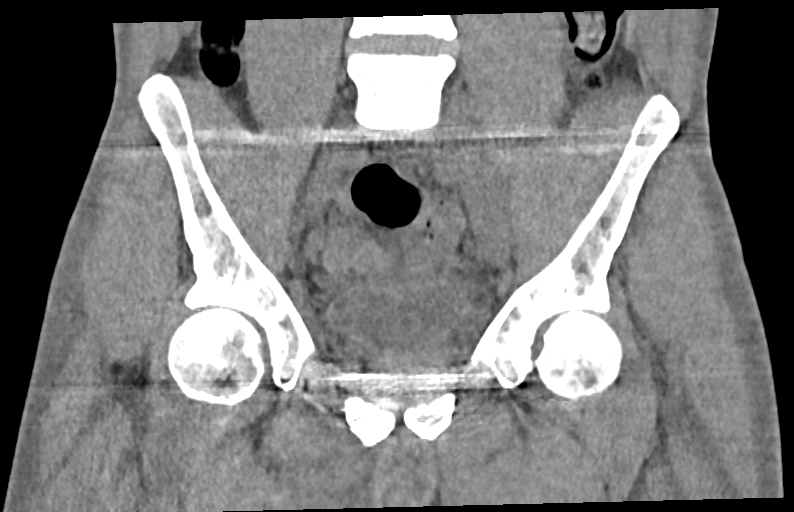
[im 85/114  soft-tissue]
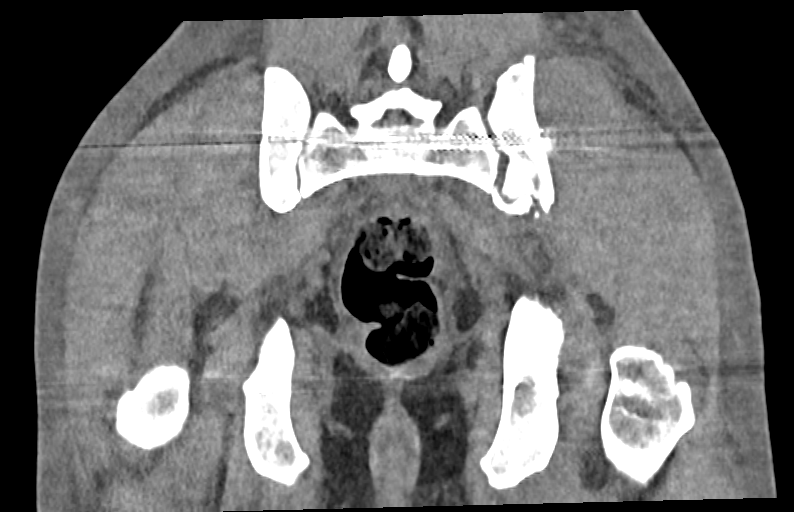

[Series 4: soft tissue (person_name) · axial · 0.67mm/px · z∈[-733,-548]mm · 12 of 43 slices shown, 14 images]
[im 3/43  soft-tissue]
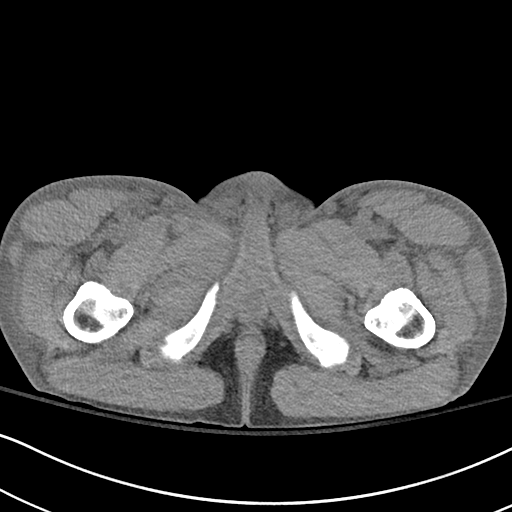
[im 3/43  bone]
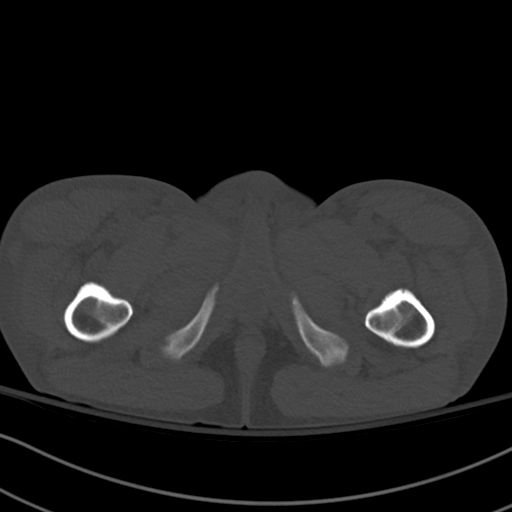
[im 6/43  soft-tissue]
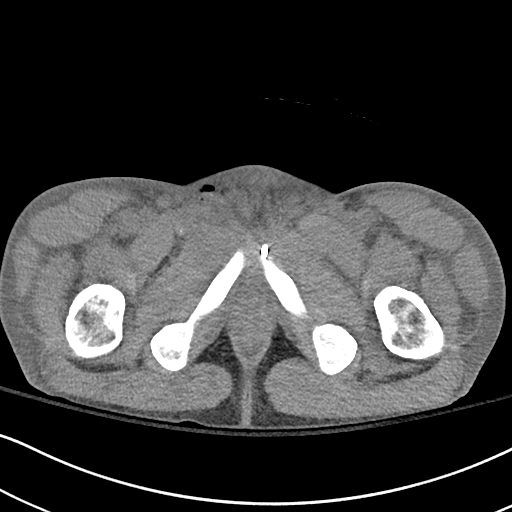
[im 10/43  soft-tissue]
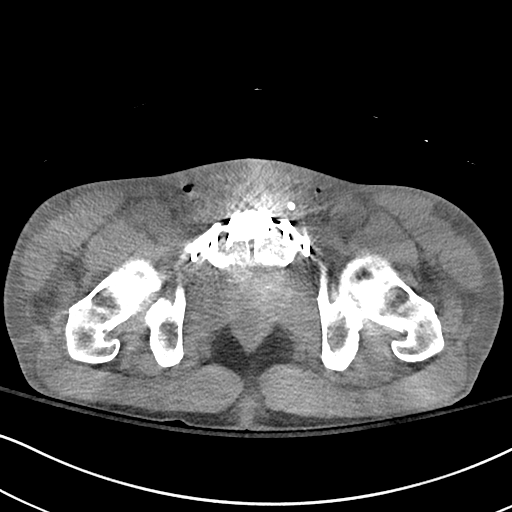
[im 13/43  soft-tissue]
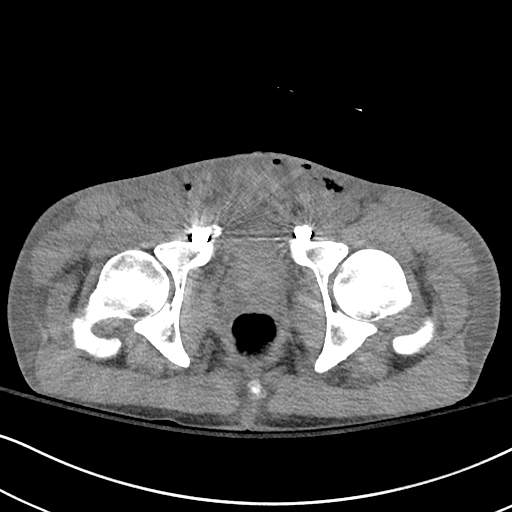
[im 17/43  soft-tissue]
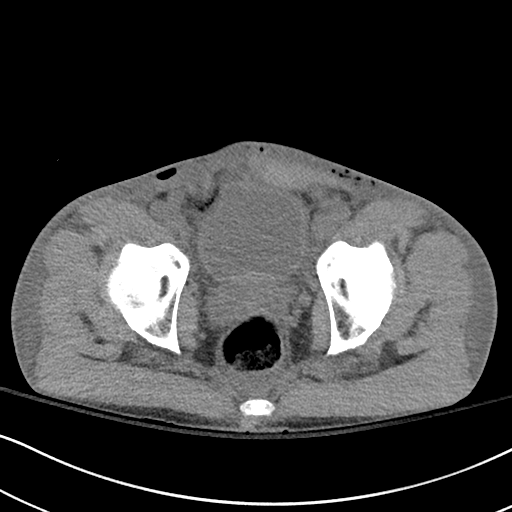
[im 19/43  soft-tissue]
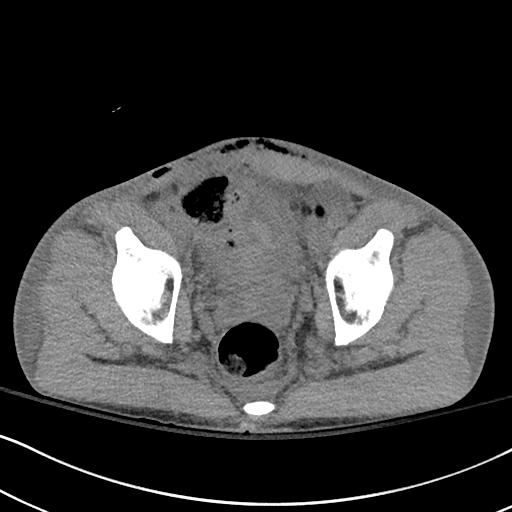
[im 24/43  soft-tissue]
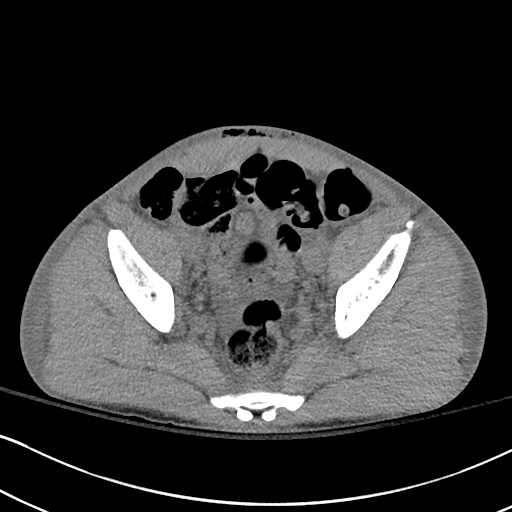
[im 26/43  soft-tissue]
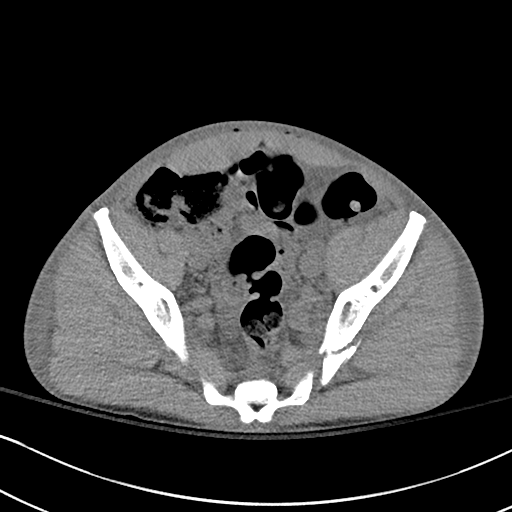
[im 30/43  soft-tissue]
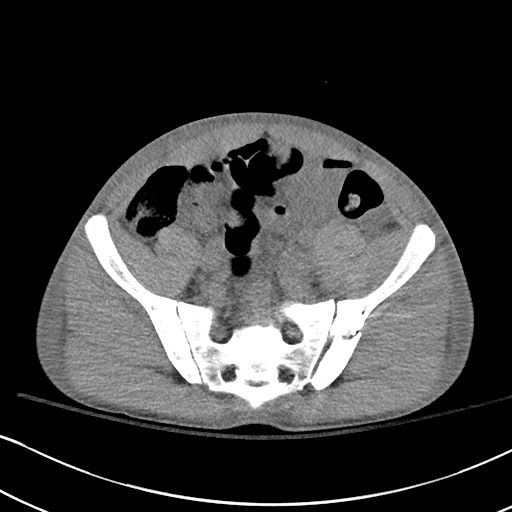
[im 30/43  bone]
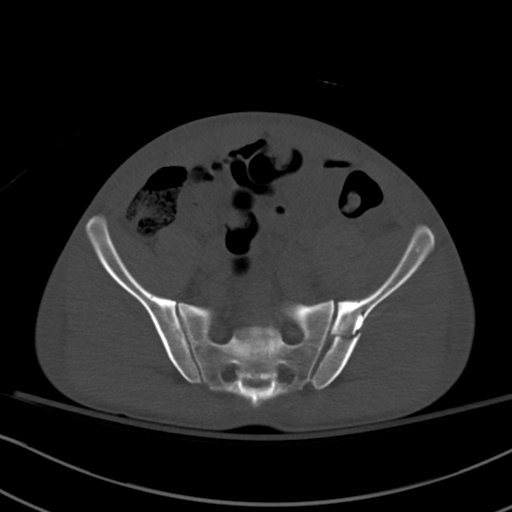
[im 33/43  soft-tissue]
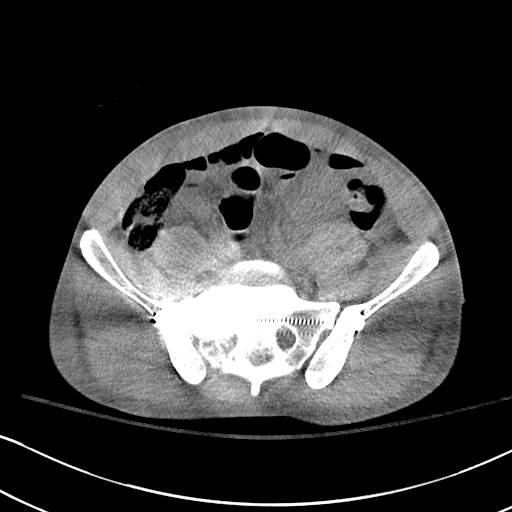
[im 37/43  soft-tissue]
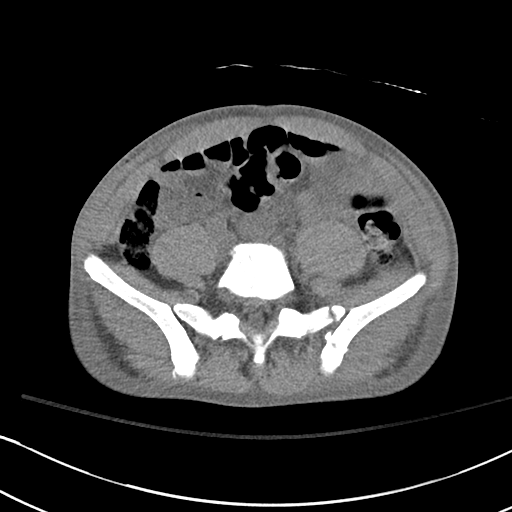
[im 40/43  soft-tissue]
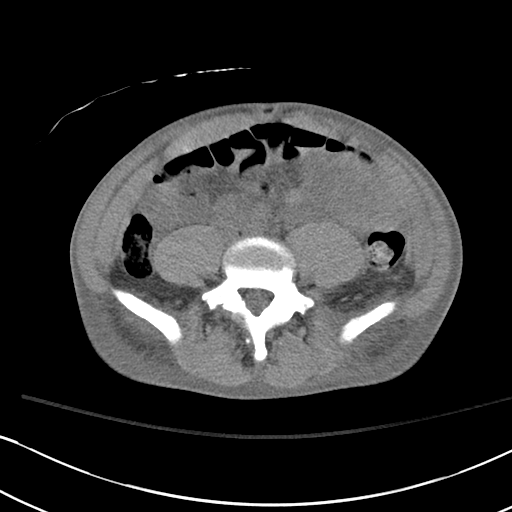

[15 of 46 positions shown; findings below may reference images not displayed]

FINDINGS: Urinary Tract:  No abnormality visualized.

Bowel:  Unremarkable visualized pelvic bowel loops.

Vascular/Lymphatic: No pathologically enlarged lymph nodes. No
significant vascular abnormality seen.

Reproductive:  No mass or other significant abnormality

Other:  Small volume free fluid within the pelvis.

Musculoskeletal: Interval postsurgical changes of pelvic open
reduction internal fixation. There is a long threaded screw
traversing the superior aspect of the bilateral sacroiliac joints.
Mildly displaced intra-articular fracture of the left iliac bone
with similar degree of mild diastasis along the inferior margin of
the left SI joint (series 5, image 16). Relatively nondisplaced
fracture along the superior aspect of the left sacral ala (series 5,
image 6), unchanged. Chronic appearing right L5 pars
interarticularis defect. New anterior plate and screw fixation
traversing the pubic symphysis with improved alignment. Pelvic
hardware appears intact. No new fractures. Bilateral hips intact
without fracture or dislocation.

No acute musculotendinous abnormality by CT. Small amount of air
within the anterior soft tissues overlying the pelvis, compatible
with recent surgery. Diffuse anasarca. No organized fluid
collections.
IMPRESSION: 1. Interval postsurgical changes to the bony pelvis status post ORIF
of the pubic symphysis and bilateral SI joints. No evidence of
hardware complication. No new fractures.
2. Mildly displaced intra-articular fracture of the left iliac bone
with similar degree of mild diastasis along the inferior margin of
the left SI joint.
3. Relatively nondisplaced fracture along the superior aspect of the
left sacral ala, unchanged.
4. Small volume free fluid within the pelvis, nonspecific.
5. Diffuse anasarca.

## 2022-08-14 ENCOUNTER — Emergency Department (HOSPITAL_COMMUNITY): Payer: 59

## 2022-08-14 ENCOUNTER — Emergency Department (HOSPITAL_COMMUNITY)
Admission: EM | Admit: 2022-08-14 | Discharge: 2022-08-14 | Disposition: A | Discharge status: Home / Self Care | Payer: 59 | Attending: Emergency Medicine | Admitting: Emergency Medicine

## 2022-08-14 ENCOUNTER — Other Ambulatory Visit: Payer: Self-pay

## 2022-08-14 ENCOUNTER — Encounter (HOSPITAL_COMMUNITY): Payer: Self-pay

## 2022-08-14 DIAGNOSIS — R9431 Abnormal electrocardiogram [ECG] [EKG]: Secondary | ICD-10-CM | POA: Diagnosis not present

## 2022-08-14 DIAGNOSIS — S0101XA Laceration without foreign body of scalp, initial encounter: Secondary | ICD-10-CM

## 2022-08-14 DIAGNOSIS — Z23 Encounter for immunization: Secondary | ICD-10-CM | POA: Diagnosis not present

## 2022-08-14 DIAGNOSIS — R58 Hemorrhage, not elsewhere classified: Secondary | ICD-10-CM | POA: Diagnosis not present

## 2022-08-14 DIAGNOSIS — R0689 Other abnormalities of breathing: Secondary | ICD-10-CM | POA: Diagnosis not present

## 2022-08-14 DIAGNOSIS — S0990XA Unspecified injury of head, initial encounter: Secondary | ICD-10-CM | POA: Diagnosis not present

## 2022-08-14 DIAGNOSIS — R410 Disorientation, unspecified: Secondary | ICD-10-CM | POA: Diagnosis not present

## 2022-08-14 MED ORDER — ACETAMINOPHEN 500 MG PO TABS
1000.0000 mg | ORAL_TABLET | Freq: Once | ORAL | Status: AC
  Administered 2022-08-14: 1000 mg via ORAL
  Filled 2022-08-14: qty 2

## 2022-08-14 MED ORDER — TETANUS-DIPHTH-ACELL PERTUSSIS 5-2.5-18.5 LF-MCG/0.5 IM SUSY
0.5000 mL | PREFILLED_SYRINGE | Freq: Once | INTRAMUSCULAR | Status: AC
  Administered 2022-08-14: 0.5 mL via INTRAMUSCULAR
  Filled 2022-08-14: qty 0.5

## 2022-08-14 MED ORDER — CEFAZOLIN SODIUM-DEXTROSE 2-4 GM/100ML-% IV SOLN
2.0000 g | Freq: Once | INTRAVENOUS | Status: AC
  Administered 2022-08-14: 2 g via INTRAVENOUS
  Filled 2022-08-14: qty 100

## 2022-08-14 MED ORDER — LIDOCAINE-EPINEPHRINE-TETRACAINE (LET) TOPICAL GEL
3.0000 mL | Freq: Once | TOPICAL | Status: AC
  Administered 2022-08-14: 3 mL via TOPICAL
  Filled 2022-08-14: qty 3

## 2022-08-14 MED ORDER — KETOROLAC TROMETHAMINE 30 MG/ML IJ SOLN
30.0000 mg | Freq: Once | INTRAMUSCULAR | Status: AC
  Administered 2022-08-14: 30 mg via INTRAVENOUS
  Filled 2022-08-14: qty 1
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
## 2022-08-21 ENCOUNTER — Emergency Department (HOSPITAL_COMMUNITY)
Admission: EM | Admit: 2022-08-21 | Discharge: 2022-08-21 | Disposition: A | Discharge status: Home / Self Care | Payer: 59 | Attending: Emergency Medicine | Admitting: Emergency Medicine

## 2022-08-21 ENCOUNTER — Emergency Department (HOSPITAL_COMMUNITY): Payer: 59

## 2022-08-21 ENCOUNTER — Other Ambulatory Visit: Payer: Self-pay

## 2022-08-21 DIAGNOSIS — Z743 Need for continuous supervision: Secondary | ICD-10-CM | POA: Diagnosis not present

## 2022-08-21 DIAGNOSIS — S51011A Laceration without foreign body of right elbow, initial encounter: Secondary | ICD-10-CM | POA: Diagnosis not present

## 2022-08-21 DIAGNOSIS — M25552 Pain in left hip: Secondary | ICD-10-CM | POA: Diagnosis not present

## 2022-08-21 DIAGNOSIS — S0990XA Unspecified injury of head, initial encounter: Secondary | ICD-10-CM | POA: Insufficient documentation

## 2022-08-21 DIAGNOSIS — Z4802 Encounter for removal of sutures: Secondary | ICD-10-CM | POA: Diagnosis not present

## 2022-08-21 DIAGNOSIS — R519 Headache, unspecified: Secondary | ICD-10-CM | POA: Diagnosis not present

## 2022-08-21 DIAGNOSIS — S0101XD Laceration without foreign body of scalp, subsequent encounter: Secondary | ICD-10-CM | POA: Diagnosis not present

## 2022-08-21 DIAGNOSIS — M25521 Pain in right elbow: Secondary | ICD-10-CM | POA: Diagnosis not present

## 2022-08-21 MED ORDER — CEPHALEXIN 500 MG PO CAPS: 500.0000 mg | ORAL_CAPSULE | Freq: Four times a day (QID) | ORAL | 0 refills | Status: AC

## 2022-08-21 MED ORDER — LIDOCAINE HCL (PF) 1 % IJ SOLN
5.0000 mL | Freq: Once | INTRAMUSCULAR | Status: AC
  Administered 2022-08-21: 5 mL
  Filled 2022-08-21: qty 5
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
## 2022-08-21 NOTE — ED Notes (Signed)
Patient transported to CT scan . 
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
## 2022-08-21 NOTE — Discharge Instructions (Signed)
Small laceration received 7 sutures, please refrain from getting wet for today, starting tomorrow please rinse out the wound and apply new dressings, do this twice daily.  May use over-the-counter pain medication as needed.  To help decrease scarring please stay out of direct sunlight for the first 6 weeks.  I have started you on antibiotics please take as prescribed  Follow-up next 10 days for suture removal either at this department urgent care or your primary care provider.  If you notice worsening redness swelling discharge or worsening pain in the area please come back in for reassessment

## 2022-09-02 ENCOUNTER — Emergency Department (HOSPITAL_COMMUNITY)
Admission: EM | Admit: 2022-09-02 | Discharge: 2022-09-02 | Disposition: A | Discharge status: Home / Self Care | Payer: 59 | Attending: Student | Admitting: Student

## 2022-09-02 ENCOUNTER — Encounter (HOSPITAL_COMMUNITY): Payer: Self-pay

## 2022-09-02 DIAGNOSIS — Z4802 Encounter for removal of sutures: Secondary | ICD-10-CM | POA: Diagnosis not present

## 2022-09-02 DIAGNOSIS — X58XXXD Exposure to other specified factors, subsequent encounter: Secondary | ICD-10-CM | POA: Diagnosis not present

## 2022-09-02 DIAGNOSIS — S51011D Laceration without foreign body of right elbow, subsequent encounter: Secondary | ICD-10-CM | POA: Insufficient documentation

## 2022-09-02 DIAGNOSIS — F17210 Nicotine dependence, cigarettes, uncomplicated: Secondary | ICD-10-CM | POA: Insufficient documentation

## 2022-09-02 NOTE — ED Triage Notes (Signed)
Patient is here for suture removal. Sutures in the right arm.
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
## 2023-03-24 DIAGNOSIS — F5221 Male erectile disorder: Secondary | ICD-10-CM | POA: Diagnosis not present

## 2023-03-24 DIAGNOSIS — M5432 Sciatica, left side: Secondary | ICD-10-CM | POA: Diagnosis not present

## 2023-03-24 DIAGNOSIS — M1612 Unilateral primary osteoarthritis, left hip: Secondary | ICD-10-CM | POA: Diagnosis not present

## 2023-03-24 DIAGNOSIS — M5451 Vertebrogenic low back pain: Secondary | ICD-10-CM | POA: Diagnosis not present

## 2023-03-24 DIAGNOSIS — R5383 Other fatigue: Secondary | ICD-10-CM | POA: Diagnosis not present

## 2023-03-24 DIAGNOSIS — I10 Essential (primary) hypertension: Secondary | ICD-10-CM | POA: Diagnosis not present

## 2023-03-31 DIAGNOSIS — M549 Dorsalgia, unspecified: Secondary | ICD-10-CM | POA: Diagnosis not present

## 2023-03-31 DIAGNOSIS — E161 Other hypoglycemia: Secondary | ICD-10-CM | POA: Diagnosis not present

## 2023-03-31 DIAGNOSIS — Z1331 Encounter for screening for depression: Secondary | ICD-10-CM | POA: Diagnosis not present

## 2023-03-31 DIAGNOSIS — Z0001 Encounter for general adult medical examination with abnormal findings: Secondary | ICD-10-CM | POA: Diagnosis not present

## 2023-03-31 DIAGNOSIS — Z136 Encounter for screening for cardiovascular disorders: Secondary | ICD-10-CM | POA: Diagnosis not present

## 2023-04-13 ENCOUNTER — Telehealth (HOSPITAL_COMMUNITY): Payer: Self-pay | Admitting: Licensed Clinical Social Worker

## 2023-04-13 NOTE — Telephone Encounter (Addendum)
The therapist receives an email from [facility] with a ROI and sends the following, secure email: "Mr. Krauser has had no contact with our outpatient behavioral health services currently."  [facility] responds with the following: "This will be addressed at our next [facility] appointment on 04/19/23 at 1:45pm. "  Myrna Blazer, MA, LCSW, [facility], LCAS 04/13/2023

## 2023-05-11 ENCOUNTER — Telehealth (HOSPITAL_COMMUNITY): Payer: Self-pay | Admitting: Licensed Clinical Social Worker

## 2023-05-11 NOTE — Telephone Encounter (Signed)
 The therapist receives a request for an update on this patient from Ms. Cosette C. Nerogic, B.S., CADC-I, [facility] Care Manager and sends the following, secure email:  "Mr. Gorby has had no contact with our behavioral health services to date."   Myrna Blazer, [place], [facility], LCAS 05/11/2023

## 2023-06-22 DIAGNOSIS — F419 Anxiety disorder, unspecified: Secondary | ICD-10-CM | POA: Diagnosis not present

## 2023-07-21 ENCOUNTER — Emergency Department (HOSPITAL_COMMUNITY)
Admission: EM | Admit: 2023-07-21 | Discharge: 2023-07-21 | Disposition: A | Discharge status: Home / Self Care | Attending: Emergency Medicine | Admitting: Emergency Medicine

## 2023-07-21 ENCOUNTER — Encounter (HOSPITAL_COMMUNITY): Payer: Self-pay

## 2023-07-21 ENCOUNTER — Other Ambulatory Visit: Payer: Self-pay

## 2023-07-21 ENCOUNTER — Emergency Department (HOSPITAL_COMMUNITY)

## 2023-07-21 DIAGNOSIS — M160 Bilateral primary osteoarthritis of hip: Secondary | ICD-10-CM | POA: Diagnosis not present

## 2023-07-21 DIAGNOSIS — R102 Pelvic and perineal pain: Secondary | ICD-10-CM | POA: Diagnosis not present

## 2023-07-21 DIAGNOSIS — M25552 Pain in left hip: Secondary | ICD-10-CM | POA: Diagnosis not present

## 2023-07-21 LAB — COMPREHENSIVE METABOLIC PANEL WITH GFR
ALT: 33 U/L (ref 0–44)
AST: 54 U/L — ABNORMAL HIGH (ref 15–41)
Albumin: 4 g/dL (ref 3.5–5.0)
Alkaline Phosphatase: 76 U/L (ref 38–126)
Anion gap: 14 (ref 5–15)
BUN: 9 mg/dL (ref 6–20)
CO2: 19 mmol/L — ABNORMAL LOW (ref 22–32)
Calcium: 9.1 mg/dL (ref 8.9–10.3)
Chloride: 98 mmol/L (ref 98–111)
Creatinine, Ser: 0.9 mg/dL (ref 0.61–1.24)
GFR, Estimated: >60 mL/min (ref >60)
Glucose, Bld: 92 mg/dL (ref 70–99)
Potassium: 4.7 mmol/L (ref 3.5–5.1)
Sodium: 131 mmol/L — ABNORMAL LOW (ref 135–145)
Total Bilirubin: 1.4 mg/dL — ABNORMAL HIGH (ref 0.0–1.2)
Total Protein: 7.3 g/dL (ref 6.5–8.1)

## 2023-07-21 LAB — CBC
HCT: 41.9 % (ref 39.0–52.0)
Hemoglobin: 14 g/dL (ref 13.0–17.0)
MCH: 31.7 pg (ref 26.0–34.0)
MCHC: 33.4 g/dL (ref 30.0–36.0)
MCV: 95 fL (ref 80.0–100.0)
Platelets: 234 10*3/uL (ref 150–400)
RBC: 4.41 MIL/uL (ref 4.22–5.81)
RDW: 12.7 % (ref 11.5–15.5)
WBC: 4.3 10*3/uL (ref 4.0–10.5)
nRBC: 0 % (ref 0.0–0.2)

## 2023-07-21 LAB — LIPASE, BLOOD: Lipase: 46 U/L (ref 11–51)

## 2023-07-21 MED ORDER — CELECOXIB 200 MG PO CAPS: 200.0000 mg | ORAL_CAPSULE | Freq: Two times a day (BID) | ORAL | 0 refills | Status: AC

## 2023-07-21 MED ORDER — KETOROLAC TROMETHAMINE 15 MG/ML IJ SOLN
15.0000 mg | Freq: Once | INTRAMUSCULAR | Status: AC
  Administered 2023-07-21: 15 mg via INTRAMUSCULAR
  Filled 2023-07-21: qty 1
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
## 2023-07-21 NOTE — Discharge Instructions (Signed)
 Follow up with your provider for management of your chronic pain. Take Celebrex as prescribed for pain.
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
## 2023-07-30 ENCOUNTER — Other Ambulatory Visit: Payer: Self-pay

## 2023-07-30 ENCOUNTER — Emergency Department (HOSPITAL_COMMUNITY)
Admission: EM | Admit: 2023-07-30 | Discharge: 2023-07-30 | Disposition: A | Discharge status: Home / Self Care | Attending: Emergency Medicine | Admitting: Emergency Medicine

## 2023-07-30 ENCOUNTER — Emergency Department (HOSPITAL_COMMUNITY)

## 2023-07-30 DIAGNOSIS — S61411A Laceration without foreign body of right hand, initial encounter: Secondary | ICD-10-CM | POA: Insufficient documentation

## 2023-07-30 DIAGNOSIS — W268XXA Contact with other sharp object(s), not elsewhere classified, initial encounter: Secondary | ICD-10-CM | POA: Insufficient documentation

## 2023-07-30 DIAGNOSIS — S6991XA Unspecified injury of right wrist, hand and finger(s), initial encounter: Secondary | ICD-10-CM | POA: Diagnosis present

## 2023-07-30 DIAGNOSIS — S61216A Laceration without foreign body of right little finger without damage to nail, initial encounter: Secondary | ICD-10-CM | POA: Diagnosis not present

## 2023-07-30 DIAGNOSIS — Z23 Encounter for immunization: Secondary | ICD-10-CM | POA: Diagnosis not present

## 2023-07-30 MED ORDER — CEPHALEXIN 500 MG PO CAPS: 500.0000 mg | ORAL_CAPSULE | Freq: Two times a day (BID) | ORAL | 0 refills | Status: DC

## 2023-07-30 MED ORDER — TETANUS-DIPHTH-ACELL PERTUSSIS 5-2.5-18.5 LF-MCG/0.5 IM SUSY
0.5000 mL | PREFILLED_SYRINGE | Freq: Once | INTRAMUSCULAR | Status: AC
  Administered 2023-07-30: 0.5 mL via INTRAMUSCULAR
  Filled 2023-07-30: qty 0.5

## 2023-07-30 MED ORDER — LIDOCAINE-EPINEPHRINE (PF) 2 %-1:200000 IJ SOLN: 10.0000 mL | Freq: Once | INTRAMUSCULAR | Status: AC

## 2023-07-30 MED ORDER — CEPHALEXIN 250 MG PO CAPS
500.0000 mg | ORAL_CAPSULE | Freq: Once | ORAL | Status: AC
  Administered 2023-07-30: 500 mg via ORAL
  Filled 2023-07-30: qty 2

## 2023-07-30 MED ORDER — LIDOCAINE-EPINEPHRINE (PF) 2 %-1:200000 IJ SOLN
INTRAMUSCULAR | Status: AC
  Administered 2023-07-30: 10 mL via INTRADERMAL
  Filled 2023-07-30: qty 20

## 2023-07-30 NOTE — Progress Notes (Signed)
 Orthopedic Tech Progress Note Patient Details:  Jake Silva 1980/03/18 [id number]  Ortho Devices Type of Ortho Device: Volar splint, Ace wrap, Cotton web roll, Arm sling Ortho Device/Splint Location: RUE Ortho Device/Splint Interventions: Ordered, Application, Adjustment   Post Interventions Patient Tolerated: Well Instructions Provided: Care of device  [name] L [name] 07/30/2023, 5:19 AM
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
## 2023-07-30 NOTE — Discharge Instructions (Signed)
 Please follow-up with your doctor or return to the emergency department in about 10 days for suture removal.  If after a few days you notice difficulty bending the finger or have significant pain, I recommend that you follow-up with a hand specialist.  You can contact the office listed.  Return for new or worsening symptoms.  Take antibiotics as prescribed.
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
## 2023-07-30 NOTE — Progress Notes (Signed)
 Orthopedic Tech Progress Note Patient Details:  Jake Silva 1980/12/05 [id number]  Patient ID: Jake Silva, male   DOB: May 16, 1980, 43 y.o.   MRN: [id number] Imaging has not been completed on patients hand at this time, per RN they will call back once that has been completed. Toi Foster 07/30/2023, 4:27 AM

## 2023-07-30 NOTE — ED Notes (Signed)
 Patient to xray. Ortho tech notified of splint

## 2023-08-02 ENCOUNTER — Encounter (HOSPITAL_COMMUNITY): Payer: Self-pay | Admitting: Emergency Medicine

## 2023-08-02 ENCOUNTER — Emergency Department (HOSPITAL_COMMUNITY)
Admission: EM | Admit: 2023-08-02 | Discharge: 2023-08-02 | Disposition: A | Discharge status: Home / Self Care | Attending: Emergency Medicine | Admitting: Emergency Medicine

## 2023-08-02 ENCOUNTER — Other Ambulatory Visit: Payer: Self-pay

## 2023-08-02 DIAGNOSIS — S61411A Laceration without foreign body of right hand, initial encounter: Secondary | ICD-10-CM | POA: Diagnosis not present

## 2023-08-02 DIAGNOSIS — S61411D Laceration without foreign body of right hand, subsequent encounter: Secondary | ICD-10-CM

## 2023-08-02 DIAGNOSIS — W268XXA Contact with other sharp object(s), not elsewhere classified, initial encounter: Secondary | ICD-10-CM | POA: Diagnosis not present

## 2023-08-02 DIAGNOSIS — S61216A Laceration without foreign body of right little finger without damage to nail, initial encounter: Secondary | ICD-10-CM | POA: Insufficient documentation

## 2023-08-02 MED ORDER — LIDOCAINE-EPINEPHRINE-TETRACAINE (LET) TOPICAL GEL
3.0000 mL | Freq: Once | TOPICAL | Status: AC
  Administered 2023-08-02: 3 mL via TOPICAL
  Filled 2023-08-02: qty 3

## 2023-08-02 NOTE — ED Notes (Signed)
 Jake Silva provided per provider approval

## 2023-08-02 NOTE — Discharge Instructions (Addendum)
 We placed 2 more sutures to the laceration on your hand and provided a finger splint for immobilization to lessen risk of rupturing sutures.  Return in 10 days to have the sutures removed or sooner if you are unable to move your finger well or if you notice any redness swelling or pus
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
## 2023-08-02 NOTE — ED Provider Notes (Signed)
 [place] EMERGENCY DEPARTMENT AT [facility] Provider Note   CSN: [id number] Arrival date & time: 08/02/23  4098      Chief Complaint  Patient presents with   Wound Check    Jake Silva is a 43 y.o. male.  Seen in ED 07/30/2023 due to right hand laceration after being cut with straight razor.  Laceration was repaired with 5 simple sutures and he was given a splint.  He returns today due ruptured sutures and the wound opening.  Does not have the splint stating it had blood on it.  States he is otherwise feeling well with no other complaints.   Wound Check       Home Medications Prior to Admission medications   Medication Sig Start Date End Date Taking? Authorizing Provider  acetaminophen  (TYLENOL ) 325 MG tablet Take 2 tablets (650 mg total) by mouth every 6 (six) hours as needed for mild pain or fever. 08/29/20   Angiulli, Everlyn Hockey, PA-C  celecoxib  (CELEBREX ) 200 MG capsule Take 1 capsule (200 mg total) by mouth 2 (two) times daily. 07/21/23 08/20/23  Darlis Eisenmenger, PA-C  cephALEXin  (KEFLEX ) 500 MG capsule Take 1 capsule (500 mg total) by mouth 2 (two) times daily. 07/30/23   Sherel Dikes, PA-C  docusate sodium  (COLACE) 100 MG capsule Take 1 capsule (100 mg total) by mouth 2 (two) times daily. 08/29/20   Angiulli, Everlyn Hockey, PA-C  enoxaparin  (LOVENOX ) 40 MG/0.4ML injection Inject 1 syringe under the skin once daily for 14 days, then stop 08/29/20   Angiulli, Everlyn Hockey, PA-C  folic acid  (FOLVITE ) 1 MG tablet Take 1 tablet (1 mg total) by mouth daily. 08/29/20   Angiulli, Everlyn Hockey, PA-C  gabapentin  (NEURONTIN ) 300 MG capsule Take 2 capsules (600 mg total) by mouth 3 (three) times daily. 08/29/20   Angiulli, Everlyn Hockey, PA-C  lidocaine  (LIDODERM ) 5 % Place 1 patch onto the skin daily. Remove & Discard patch within 12 hours or as directed by MD 08/29/20   Angiulli, Everlyn Hockey, PA-C  methocarbamol  (ROBAXIN ) 500 MG tablet Take 2 tablets (1,000 mg total) by mouth every 6 (six) hours.  08/29/20   Angiulli, Daniel J, PA-C  Multiple Vitamin (MULTIVITAMIN WITH MINERALS) TABS tablet Take 1 tablet by mouth daily. 08/29/20   Angiulli, Everlyn Hockey, PA-C  naphazoline-glycerin  (CLEAR EYES REDNESS) 0.012-0.25 % SOLN Place 1-2 drops into the left eye 4 (four) times daily as needed for eye irritation. 08/29/20   Angiulli, Everlyn Hockey, PA-C  Oxycodone  HCl 10 MG TABS Take 1-1.5 tablets (10-15 mg total) by mouth every 4 (four) hours as needed for severe pain or moderate pain (10mg  moderate, 15mg  severe). 08/29/20   Angiulli, Everlyn Hockey, PA-C  polyethylene glycol (MIRALAX  / GLYCOLAX ) 17 g packet Take 17 g by mouth daily. 08/29/20   Angiulli, Everlyn Hockey, PA-C  Vitamin D , Ergocalciferol , (DRISDOL ) 1.25 MG (50000 UNIT) CAPS capsule Take 1 capsule (50,000 Units total) by mouth every 7 (seven) days. 09/03/20   Angiulli, Everlyn Hockey, PA-C      Allergies    Patient has no known allergies.    Review of Systems   Review of Systems As in HPI Physical Exam Updated Vital Signs BP (!) 125/92   Pulse 96   Temp 98.7 F (37.1 C)   Resp 18   SpO2 99%  Physical Exam Constitutional:      General: He is not in acute distress.    Appearance: He is not ill-appearing.  HENT:  Mouth/Throat:     Mouth: Mucous membranes are moist.  Eyes:     Conjunctiva/sclera: Conjunctivae normal.  Cardiovascular:     Rate and Rhythm: Normal rate and regular rhythm.     Heart sounds: No murmur heard. Pulmonary:     Effort: Pulmonary effort is normal. No respiratory distress.     Breath sounds: Normal breath sounds.  Abdominal:     General: Abdomen is flat. There is no distension.     Palpations: Abdomen is soft.     Tenderness: There is no abdominal tenderness.  Musculoskeletal:     Cervical back: Neck supple.  Skin:    Comments: Laceration base of fifth metatarsal on the right with one central ruptured suture.  No surrounding warmth, erythema or drainage. see photo.  Neurological:     Mental Status: He is alert.     ED  Results / Procedures / Treatments   Labs (all labs ordered are listed, but only abnormal results are displayed) Labs Reviewed - No data to display  EKG None  Radiology No results found.  Procedures .Laceration Repair  Date/Time: 08/02/2023 9:34 AM  Performed by: Glenn Lange, DO Authorized by: Quinn Bucco, DO   Consent:    Consent obtained:  Verbal   Consent given by:  Patient   Risks, benefits, and alternatives were discussed: yes     Risks discussed:  Infection and pain Universal protocol:    Procedure explained and questions answered to patient or proxy's satisfaction: yes     Patient identity confirmed:  Verbally with patient Anesthesia:    Anesthesia method:  Topical application   Topical anesthetic:  LET Laceration details:    Location:  Hand   Hand location:  L palm   Length (cm):  2 Exploration:    Hemostasis achieved with:  Direct pressure   Imaging outcome: foreign body not noted   Treatment:    Area cleansed with:  Chlorhexidine  and saline   Amount of cleaning:  Standard   Irrigation solution:  Sterile saline Skin repair:    Repair method:  Sutures   Suture size:  5-0   Suture material:  Prolene   Suture technique:  Simple interrupted   Number of sutures:  2 (1 loose suture removed) Repair type:    Repair type:  Simple Post-procedure details:    Dressing:  Antibiotic ointment, non-adherent dressing and splint for protection   Procedure completion:  Tolerated     Medications Ordered in ED Medications - No data to display  ED Course/ Medical Decision Making/ A&P                                Medical Decision Making 43 year old male presents 3 days after laceration repair at base of fifth metatarsal on the right due to 1 ruptured suture. In ED, vitals are stable, no evidence of wound infection and he is able to flex and extend the fifth R digit without difficulty. Wound repaired with 2 new sutures.  Patient advised to return in approximately  10 days for suture removal or sooner if he develops difficulty with flexion or extension of finger or any signs of infection which were discussed.    Final Clinical Impression(s) / ED Diagnoses Final diagnoses:  None    Rx / DC Orders ED Discharge Orders     None         Glenn Lange, DO 08/02/23 [phone]  Quinn Bucco, DO 08/16/23 2105

## 2023-08-11 ENCOUNTER — Other Ambulatory Visit: Payer: Self-pay

## 2023-08-11 ENCOUNTER — Encounter (HOSPITAL_COMMUNITY): Payer: Self-pay

## 2023-08-11 ENCOUNTER — Other Ambulatory Visit (HOSPITAL_COMMUNITY): Payer: Self-pay

## 2023-08-11 ENCOUNTER — Emergency Department (HOSPITAL_COMMUNITY)
Admission: EM | Admit: 2023-08-11 | Discharge: 2023-08-11 | Disposition: A | Discharge status: Home / Self Care | Attending: Emergency Medicine | Admitting: Emergency Medicine

## 2023-08-11 DIAGNOSIS — F10120 Alcohol abuse with intoxication, uncomplicated: Secondary | ICD-10-CM | POA: Insufficient documentation

## 2023-08-11 DIAGNOSIS — Y908 Blood alcohol level of 240 mg/100 ml or more: Secondary | ICD-10-CM | POA: Insufficient documentation

## 2023-08-11 DIAGNOSIS — S61411D Laceration without foreign body of right hand, subsequent encounter: Secondary | ICD-10-CM | POA: Insufficient documentation

## 2023-08-11 DIAGNOSIS — X58XXXD Exposure to other specified factors, subsequent encounter: Secondary | ICD-10-CM | POA: Diagnosis not present

## 2023-08-11 DIAGNOSIS — F10920 Alcohol use, unspecified with intoxication, uncomplicated: Secondary | ICD-10-CM

## 2023-08-11 DIAGNOSIS — F1010 Alcohol abuse, uncomplicated: Secondary | ICD-10-CM | POA: Diagnosis present

## 2023-08-11 LAB — COMPREHENSIVE METABOLIC PANEL WITH GFR
ALT: 41 U/L (ref 0–44)
AST: 73 U/L — ABNORMAL HIGH (ref 15–41)
Albumin: 3.9 g/dL (ref 3.5–5.0)
Alkaline Phosphatase: 73 U/L (ref 38–126)
Anion gap: 11 (ref 5–15)
BUN: 9 mg/dL (ref 6–20)
CO2: 21 mmol/L — ABNORMAL LOW (ref 22–32)
Calcium: 8.7 mg/dL — ABNORMAL LOW (ref 8.9–10.3)
Chloride: 107 mmol/L (ref 98–111)
Creatinine, Ser: 0.84 mg/dL (ref 0.61–1.24)
GFR, Estimated: >60 mL/min (ref >60)
Glucose, Bld: 88 mg/dL (ref 70–99)
Potassium: 4.2 mmol/L (ref 3.5–5.1)
Sodium: 139 mmol/L (ref 135–145)
Total Bilirubin: 0.3 mg/dL (ref 0.0–1.2)
Total Protein: 7 g/dL (ref 6.5–8.1)

## 2023-08-11 LAB — CBC WITH DIFFERENTIAL/PLATELET
Abs Immature Granulocytes: 0.01 10*3/uL (ref 0.00–0.07)
Basophils Absolute: 0.1 10*3/uL (ref 0.0–0.1)
Basophils Relative: 1 %
Eosinophils Absolute: 0 10*3/uL (ref 0.0–0.5)
Eosinophils Relative: 1 %
HCT: 40 % (ref 39.0–52.0)
Hemoglobin: 13.2 g/dL (ref 13.0–17.0)
Immature Granulocytes: 0 %
Lymphocytes Relative: 50 %
Lymphs Abs: 2.4 10*3/uL (ref 0.7–4.0)
MCH: 31.6 pg (ref 26.0–34.0)
MCHC: 33 g/dL (ref 30.0–36.0)
MCV: 95.7 fL (ref 80.0–100.0)
Monocytes Absolute: 0.3 10*3/uL (ref 0.1–1.0)
Monocytes Relative: 7 %
Neutro Abs: 2 10*3/uL (ref 1.7–7.7)
Neutrophils Relative %: 41 %
Platelets: 292 10*3/uL (ref 150–400)
RBC: 4.18 MIL/uL — ABNORMAL LOW (ref 4.22–5.81)
RDW: 13.4 % (ref 11.5–15.5)
WBC: 4.8 10*3/uL (ref 4.0–10.5)
nRBC: 0 % (ref 0.0–0.2)

## 2023-08-11 LAB — RAPID URINE DRUG SCREEN, HOSP PERFORMED
Amphetamines: NOT DETECTED
Barbiturates: NOT DETECTED
Benzodiazepines: NOT DETECTED
Cocaine: NOT DETECTED
Opiates: NOT DETECTED
Tetrahydrocannabinol: NOT DETECTED

## 2023-08-11 LAB — ETHANOL: Alcohol, Ethyl (B): 344 mg/dL (ref <15)

## 2023-08-11 MED ORDER — CHLORDIAZEPOXIDE HCL 25 MG PO CAPS
ORAL_CAPSULE | ORAL | 0 refills | Status: AC
  Filled 2023-08-11: qty 10, 2d supply, fill #0

## 2023-08-11 NOTE — ED Provider Notes (Signed)
 [place] EMERGENCY DEPARTMENT AT [facility] Provider Note   CSN: [id number] Arrival date & time: 08/11/23  1324     History  Chief Complaint  Patient presents with   Request Detox    Jake Silva is a 43 y.o. male.  HPI   Patient has a history of alcohol abuse homelessness.  The BHRT caseworker responded to a drunk and disorderly subject.  Patient was found in the state.  Indicated that he had been using crack cocaine and alcohol.  He was drinking this morning.  Patient states he would like detox.  He denies any thoughts of harming himself.  He is not having any complaints of other acute medical conditions.  Home Medications Prior to Admission medications   Medication Sig Start Date End Date Taking? Authorizing Provider  chlordiazePOXIDE (LIBRIUM) 25 MG capsule 50mg  PO TID x 1D, then 25-50mg  PO BID X 1D, then 25-50mg  PO QD X 1D 08/11/23  Yes Trish Furl, MD  celecoxib  (CELEBREX ) 200 MG capsule Take 1 capsule (200 mg total) by mouth 2 (two) times daily. Patient not taking: Reported on 08/11/2023 07/21/23 08/20/23  Editha Goring A, PA-C  cephALEXin  (KEFLEX ) 500 MG capsule Take 1 capsule (500 mg total) by mouth 2 (two) times daily. Patient not taking: Reported on 08/11/2023 07/30/23   Sherel Dikes, PA-C      Allergies    Patient has no known allergies.    Review of Systems   Review of Systems  Physical Exam Updated Vital Signs BP 104/69   Pulse 95   Temp 98.1 F (36.7 C) (Oral)   Resp 17   Ht 1.702 m (5\' 7" )   Wt 61.2 kg   SpO2 100%   BMI 21.14 kg/m  Physical Exam Vitals and nursing note reviewed.  Constitutional:      General: He is not in acute distress.    Appearance: He is well-developed.  HENT:     Head: Normocephalic and atraumatic.     Right Ear: External ear normal.     Left Ear: External ear normal.  Eyes:     General: No scleral icterus.       Right eye: No discharge.        Left eye: No discharge.     Conjunctiva/sclera: Conjunctivae  normal.  Neck:     Trachea: No tracheal deviation.  Cardiovascular:     Rate and Rhythm: Normal rate.  Pulmonary:     Effort: Pulmonary effort is normal. No respiratory distress.     Breath sounds: No stridor.  Abdominal:     General: There is no distension.  Musculoskeletal:        General: No swelling or deformity.     Cervical back: Neck supple.     Comments: Well-healed laceration with sutures in the right hand  Skin:    General: Skin is warm and dry.     Findings: No rash.  Neurological:     Mental Status: He is alert. Mental status is at baseline.     Cranial Nerves: No cranial nerve deficit, dysarthria or facial asymmetry.     Motor: No weakness or seizure activity.     ED Results / Procedures / Treatments   Labs (all labs ordered are listed, but only abnormal results are displayed) Labs Reviewed  COMPREHENSIVE METABOLIC PANEL WITH GFR - Abnormal; Notable for the following components:      Result Value   CO2 21 (*)    Calcium 8.7 (*)  AST 73 (*)    All other components within normal limits  ETHANOL - Abnormal; Notable for the following components:   Alcohol, Ethyl (B) 344 (*)    All other components within normal limits  CBC WITH DIFFERENTIAL/PLATELET - Abnormal; Notable for the following components:   RBC 4.18 (*)    All other components within normal limits  RAPID URINE DRUG SCREEN, HOSP PERFORMED    EKG EKG Interpretation Date/Time:  Wednesday Aug 11 2023 09:30:42 EDT Ventricular Rate:  89 PR Interval:  143 QRS Duration:  106 QT Interval:  387 QTC Calculation: 471 R Axis:   -57  Text Interpretation: Sinus rhythm Left anterior fascicular block Abnormal R-wave progression, late transition No significant change since last tracing Confirmed by Trish Furl [phone]) on 08/11/2023 9:32:40 AM  Radiology No results found.  Procedures Suture Removal  Date/Time: 08/11/2023 10:09 AM  Performed by: Trish Furl, MD Authorized by: Trish Furl, MD   Consent:     Consent obtained:  Verbal   Consent given by:  Patient Location:    Location:  Upper extremity   Upper extremity location:  Hand   Hand location:  R hand Procedure details:    Wound appearance:  No signs of infection     Medications Ordered in ED Medications - No data to display  ED Course/ Medical Decision Making/ A&P Clinical Course as of 08/11/23 1508  Wed Aug 11, 2023  1039 CBC with Diff(!) Labs reviewed.  No anemia.  No significant electrolyte abnormalities.  AST slightly increased.  Likely related to his alcohol level.  Ethanol significantly elevated at 344.  Will continue to monitor in the ED until patient is clinically sober.  Will monitor for possible withdrawal symptoms developing [JK]  1151 UDS negative.  Will continue to monitor until he is sober [JK]  1505 Patient is awake.  He does not appear intoxicated he is eating lunch [JK]    Clinical Course User Index [JK] Trish Furl, MD                                 Medical Decision Making Amount and/or Complexity of Data Reviewed Labs: ordered. Decision-making details documented in ED Course.   Patient presented to the ED for evaluation of alcohol and substance abuse.  Patient requesting detox.  Patient denies any thoughts of harming himself or anyone else.  Patient was notably intoxicated on arrival.  He was monitored and does not show any signs of withdrawal.  Patient is interested in trying to quit drinking.  I reviewed the case with NP Florie Husband.  There are no beds available at [facility].  Patient appears appropriate for discharge.  I will give him information about outpatient detox resources.  I will give him a prescription for Librium and cautioned him on not drinking alcohol when taking Librium for withdrawal symptoms        Final Clinical Impression(s) / ED Diagnoses Final diagnoses:  Alcoholic intoxication without complication (HCC)    Rx / DC Orders ED Discharge Orders          Ordered    chlordiazePOXIDE  (LIBRIUM) 25 MG capsule        08/11/23 1508              Trish Furl, MD 08/11/23 [phone]

## 2023-08-11 NOTE — ED Notes (Signed)
 Pt states "I need detox." Pt is slurring and not completing sentences. He is alert and cooperative. EDP at bedside

## 2023-08-11 NOTE — ED Notes (Signed)
 Pt provided with graham crackers and chocolate ice cream per his request.

## 2023-08-11 NOTE — ED Triage Notes (Signed)
 Pt came in via POV d/t with BHRT caseworker with him that states they responded to a "drunk & disorderly" subject & when he was approached pt states he uses crack/cocaine & hasn't in 2 days but all last night until this morning reports that he was indulging in ETOH. Pt is here requesting detox.

## 2023-08-11 NOTE — Discharge Instructions (Signed)
 Please review your discharge instructions about outpatient resources in the area to help you with your alcohol use.  The Librium medication can help prevent withdrawals and shaking when you stop drinking.  Make sure to not take that medication with any alcohol.

## 2023-08-12 ENCOUNTER — Other Ambulatory Visit (HOSPITAL_COMMUNITY): Payer: Self-pay

## 2023-08-14 ENCOUNTER — Other Ambulatory Visit (HOSPITAL_COMMUNITY): Payer: Self-pay

## 2023-08-16 ENCOUNTER — Other Ambulatory Visit (HOSPITAL_COMMUNITY): Payer: Self-pay

## 2023-08-27 ENCOUNTER — Encounter (HOSPITAL_COMMUNITY): Payer: Self-pay

## 2023-08-27 ENCOUNTER — Other Ambulatory Visit: Payer: Self-pay

## 2023-08-27 ENCOUNTER — Emergency Department (HOSPITAL_COMMUNITY)

## 2023-08-27 ENCOUNTER — Emergency Department (HOSPITAL_COMMUNITY)
Admission: EM | Admit: 2023-08-27 | Discharge: 2023-08-27 | Disposition: A | Discharge status: Home / Self Care | Attending: Emergency Medicine | Admitting: Emergency Medicine

## 2023-08-27 DIAGNOSIS — R102 Pelvic and perineal pain: Secondary | ICD-10-CM

## 2023-08-27 DIAGNOSIS — F10129 Alcohol abuse with intoxication, unspecified: Secondary | ICD-10-CM | POA: Insufficient documentation

## 2023-08-27 DIAGNOSIS — Z5900 Homelessness unspecified: Secondary | ICD-10-CM | POA: Diagnosis not present

## 2023-08-27 DIAGNOSIS — R109 Unspecified abdominal pain: Secondary | ICD-10-CM | POA: Diagnosis not present

## 2023-08-27 DIAGNOSIS — N200 Calculus of kidney: Secondary | ICD-10-CM | POA: Diagnosis not present

## 2023-08-27 DIAGNOSIS — S32302A Unspecified fracture of left ilium, initial encounter for closed fracture: Secondary | ICD-10-CM | POA: Diagnosis not present

## 2023-08-27 DIAGNOSIS — F10920 Alcohol use, unspecified with intoxication, uncomplicated: Secondary | ICD-10-CM

## 2023-08-27 LAB — URINALYSIS, W/ REFLEX TO CULTURE (INFECTION SUSPECTED)
Bacteria, UA: NONE SEEN
Bilirubin Urine: NEGATIVE
Glucose, UA: NEGATIVE mg/dL
Hgb urine dipstick: NEGATIVE
Ketones, ur: NEGATIVE mg/dL
Leukocytes,Ua: NEGATIVE
Nitrite: NEGATIVE
Protein, ur: NEGATIVE mg/dL
Specific Gravity, Urine: 1.002 — ABNORMAL LOW (ref 1.005–1.030)
pH: 5 (ref 5.0–8.0)

## 2023-08-27 NOTE — ED Notes (Signed)
 RN asked patient if he wanted to try walking, patient refused to walk at this time.

## 2023-08-27 NOTE — Discharge Instructions (Addendum)
We believe that your symptoms are caused by musculoskeletal strain.  Please read through the included information about additional care such as heating pads, over-the-counter pain medicine.  If you were provided a prescription please use it only as needed and as instructed.  Remember that early mobility and using the affected part of your body is actually better than keeping it immobile. ° °Follow-up with the doctor listed as recommended or return to the emergency department with new or worsening symptoms that concern you. ° °

## 2023-08-27 NOTE — ED Triage Notes (Signed)
 PER EMS: pt was picked up from [employer], was trespassing and the police was called out. He told police he was having pelvic pain so they call EMS. The pain radiates down both legs, onset in 2022. He admits to drinking a 40 today.   BP- 140/84, HR-90, 95%, CBG-82

## 2023-08-27 NOTE — ED Provider Notes (Signed)
 Emergency Department Provider Note   I have reviewed the triage vital signs and the nursing notes.   HISTORY  Chief Complaint Pelvic Pain   HPI Jake Silva is a 43 y.o. male with PMH of EtOH abuse and homelessness presents to the emergency department for report of pelvic pain and some dysuria.  He was apparently encountered by police at [facility].  According to EMS, they were called to the scene with the patient trespassing on [employer] property.  When police arrived he began complaining of pain in his pelvis and admitted to drinking fairly heavily this evening.  They brought him here with his medical complaints.  Denies suicidal or homicidal ideation.  Denies any drug use.  Past Medical History:  Diagnosis Date   Alcohol abuse    Homelessness     Review of Systems  Constitutional: No fever/chills Cardiovascular: Denies chest pain. Respiratory: Denies shortness of breath. Gastrointestinal: No abdominal pain.  No nausea, no vomiting.  Genitourinary: Mild dysuria.  Musculoskeletal: Positive pelvic pain.  Skin: Negative for rash. Neurological: Negative for headache. ____________________________________________   PHYSICAL EXAM:  VITAL SIGNS: ED Triage Vitals [08/27/23 0139]  Encounter Vitals Group     BP (!) 135/97     Pulse Rate 92     Resp 18     Temp 98.7 F (37.1 C)     Temp Source Oral     SpO2 100 %     Weight 135 lb (61.2 kg)     Height 5' 7 (1.702 m)   Constitutional: Alert. Appears intoxicated.  Eyes: Conjunctivae are normal.  Head: Atraumatic. Nose: No congestion/rhinnorhea. Mouth/Throat: Mucous membranes are moist.  Neck: No stridor.   Cardiovascular: Normal rate, regular rhythm. Good peripheral circulation. Grossly normal heart sounds.   Respiratory: Normal respiratory effort.  No retractions. Lungs CTAB. Gastrointestinal: Soft and nontender. No distention.  Musculoskeletal: No gross deformities of extremities. Neurologic:  Normal speech  and language.  Skin:  Skin is warm, dry and intact. No rash noted.  ____________________________________________   LABS (all labs ordered are listed, but only abnormal results are displayed)  Labs Reviewed  URINALYSIS, W/ REFLEX TO CULTURE (INFECTION SUSPECTED) - Abnormal; Notable for the following components:      Result Value   Color, Urine COLORLESS (*)    Specific Gravity, Urine 1.002 (*)    All other components within normal limits  GC/CHLAMYDIA PROBE AMP ([place]) NOT AT [facility]   ____________________________________________  RADIOLOGY  CT Renal Stone Study Result Date: 08/27/2023 CLINICAL DATA:  Abdominal/flank pain, stone suspected. EXAM: CT ABDOMEN AND PELVIS WITHOUT CONTRAST TECHNIQUE: Multidetector CT imaging of the abdomen and pelvis was performed following the standard protocol without IV contrast. RADIATION DOSE REDUCTION: This exam was performed according to the departmental dose-optimization program which includes automated exposure control, adjustment of the mA and/or kV according to patient size and/or use of iterative reconstruction technique. COMPARISON:  CT pelvis 08/13/2020 FINDINGS: Lower chest: No acute abnormality. Hepatobiliary: Unremarkable noncontrast appearance of the liver, gallbladder, and biliary tree. Pancreas: Unremarkable. Spleen: Unremarkable. Adrenals/Urinary Tract: Normal adrenal glands. Punctate nonobstructing bilateral nephrolithiasis. No hydronephrosis. Unremarkable bladder where not obscured by streak artifact. Stomach/Bowel: Normal caliber large and small bowel. No bowel wall thickening. Stomach is within normal limits. No evidence of appendicitis. Vascular/Lymphatic: No significant vascular findings are present. No enlarged abdominal or pelvic lymph nodes. Reproductive: Unremarkable. Other: No free intraperitoneal fluid or air. Musculoskeletal: Plain screw fixation about the pubic symphysis. Screw fixation of the bilateral SI  joints. No acute  fracture. Healed left iliac wing fracture. IMPRESSION: 1. No acute abnormality in the abdomen or pelvis. 2. Punctate nonobstructing right nephrolithiasis. No hydronephrosis. Electronically Signed   By: Rozell Cornet M.D.   On: 08/27/2023 02:44    ____________________________________________   PROCEDURES  Procedure(s) performed:   Procedures  None  ____________________________________________   INITIAL IMPRESSION / ASSESSMENT AND PLAN / ED COURSE  Pertinent labs & imaging results that were available during my care of the patient were reviewed by me and considered in my medical decision making (see chart for details).   This patient is Presenting for Evaluation of pelvic pain, which does require a range of treatment options, and is a complaint that involves a high risk of morbidity and mortality.  The Differential Diagnoses include chronic pain, EtOH intoxication, malingering, ureteral stone, STI, UTI, etc.  I did obtain Additional Historical Information from EMS, as the patient is intoxicated.   Clinical Laboratory Tests Ordered, included UA without infection.   Radiologic Tests Ordered, included CT renal. I independently interpreted the images and agree with radiology interpretation.   Social Determinants of Health Risk patient with EtOH abuse history.   Medical Decision Making: Summary:  Patient presents to the emergency department with complaint of pelvic pain and dysuria.  He arrives to the ED after encountering police at a local [employer].  Abdomen soft/nontender.  Normal vital signs.  Patient is awake/alert but does appear intoxicated.  Exceedingly low suspicion for acute process but will obtain a screening UA and CT renal. Patient will need to clinically sober.   Reevaluation with update and discussion with patient. CT and UA without acute finding. Patient sober clinically and stable for discharge.   Patient's presentation is most consistent with acute presentation with  potential threat to life or bodily function.   Disposition: discharge  ____________________________________________  FINAL CLINICAL IMPRESSION(S) / ED DIAGNOSES  Final diagnoses:  Alcoholic intoxication without complication (HCC)  Pelvic pain    Note:  This document was prepared using Dragon voice recognition software and may include unintentional dictation errors.  Abby Hocking, MD, [facility]    Mindel Friscia, Shereen Dike, MD 08/27/23 [phone]

## 2023-08-30 DIAGNOSIS — D649 Anemia, unspecified: Secondary | ICD-10-CM | POA: Diagnosis not present

## 2023-08-30 DIAGNOSIS — D72819 Decreased white blood cell count, unspecified: Secondary | ICD-10-CM | POA: Diagnosis not present

## 2023-08-30 LAB — GC/CHLAMYDIA PROBE AMP (~~LOC~~) NOT AT ARMC
Chlamydia: NEGATIVE
Comment: NEGATIVE
Comment: NORMAL
Neisseria Gonorrhea: NEGATIVE
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
## 2023-08-30 NOTE — ED Provider Notes (Signed)
 Patient placed in First Look pathway, seen and evaluated for chief complaint of detox from cocaine and alcohol. Last used this morning. Denies any other complaints. Pertinent exam findings include afebrile, VSS, NAD, unlabored respirations, speaking in complete sentences. Patient counseled on process, plan, and necessity for staying for completing the evaluation.   This document serves as a record of services personally performed by Leonor Hope, PA-C.  Emergency Department Provider Note  Provider at bedside: 1:58 PM  History obtained from the: Patient  History   Chief Complaint  Patient presents with  . Addiction Problem      History provided by:  Patient and medical records History limited by:  Psychiatric disorder   Jake Silva is a 43 y.o. male who presents to the ED with complaints of request for detox.  Patient states he suffers from substance abuse including alcohol and cocaine use.  He has attempted to get help to stop using alcohol and cocaine.  He denies any suicidal or homicidal ideation.  He states that he drinks alcohol heavily at least 4 days a week.  He is able to maintain his job and does claim on those other days that he does not drink alcohol.  He does occasionally have some symptoms from my drinking including feeling somewhat nervous and shaky.  He denies any history of seizures.  Last time he drank alcohol was earlier this morning.  He states his usual drink is likely beer. Currently he has been going from apartment to apartment and seems to have some housing instability.  No LMP for male patient.   Past Medical History Medical History[1]  Past Surgical History Surgical History[2]   Medications Home Medications   No medications on file.       Allergies Allergies[3]   Family History Family History[4]   Social History Social History   Socioeconomic History  . Marital status: Widowed    Spouse name: Not on file  . Number of children: Not on  file  . Years of education: Not on file  . Highest education level: Not on file  Occupational History  . Not on file  Tobacco Use  . Smoking status: Not on file  . Smokeless tobacco: Not on file  Substance and Sexual Activity  . Alcohol use: Not on file  . Drug use: Not on file  . Sexual activity: Not on file  Other Topics Concern  . Not on file  Social History Narrative  . Not on file   Social Drivers of Health   Food Insecurity: Not on file  Transportation Needs: Not on file  Safety: Not on file  Living Situation: Not on file    Review of Systems  Review of Systems  Constitutional:  Negative for activity change and fatigue.  Respiratory:  Negative for chest tightness.   Cardiovascular:  Negative for chest pain.  Gastrointestinal:  Positive for blood in stool and nausea. Negative for abdominal pain and vomiting.  Neurological:  Positive for light-headedness.  Psychiatric/Behavioral:  Positive for behavioral problems. Negative for suicidal ideas. The patient is nervous/anxious.      Physical Exam   Vitals:   08/30/23 1059 08/30/23 1813  BP: 134/89 114/85  BP Location: Right arm Right arm  Patient Position: Sitting Lying  Pulse: 101 84  Resp: 18 14  Temp: 98 F (36.7 C) 98.1 F (36.7 C)  TempSrc: Oral Oral  SpO2: 95% 98%  Weight: 61.2 kg (135 lb)   Height: 170.2 cm (5' 7)  Physical Exam Vitals and nursing note reviewed.  Constitutional:      Comments: Appears older than stated age  HENT:     Mouth/Throat:     Mouth: Mucous membranes are moist.   Eyes:     Extraocular Movements: Extraocular movements intact.     Pupils: Pupils are equal, round, and reactive to light.    Cardiovascular:     Rate and Rhythm: Normal rate and regular rhythm.     Pulses: Normal pulses.  Pulmonary:     Effort: Pulmonary effort is normal.     Breath sounds: Normal breath sounds.  Abdominal:     Tenderness: There is no abdominal tenderness.   Skin:    General:  Skin is warm and dry.     Capillary Refill: Capillary refill takes less than 2 seconds.   Neurological:     Mental Status: He is alert.     GCS: GCS eye subscore is 4. GCS verbal subscore is 5. GCS motor subscore is 6.   Psychiatric:        Attention and Perception: Attention normal.        Mood and Affect: Mood normal.        Speech: Speech is delayed.        Behavior: Behavior is slowed.        Thought Content: Thought content does not include suicidal ideation. Thought content does not include suicidal plan.        Cognition and Memory: Cognition normal.        Judgment: Judgment is impulsive and inappropriate.     Labs   Abnormal Labs Reviewed  COMPREHENSIVE METABOLIC PANEL - Abnormal; Notable for the following components:      Result Value   Calcium 8.4 (*)    All other components within normal limits  DRUG OF ABUSE 7 PANEL - Abnormal; Notable for the following components:   Cocaine Screen, Urine Positive (*)    All other components within normal limits   Narrative:    These results are for medical purposes only.  The screening procedure aims to detect the presence of drugs and it is regarded as presumptive (qualitative analysis).  Positive screening detection requires confirmation using more sensitive and specific quantitative methods, such as high performance liquid chromatography and mass spectrometry.  ETHANOL - Abnormal; Notable for the following components:   Ethanol 337 (*)    All other components within normal limits  CBC WITH DIFFERENTIAL - Abnormal; Notable for the following components:   WBC 3.44 (*)    RBC 4.13 (*)    Hemoglobin 12.9 (*)    Hematocrit 38.8 (*)    Neutrophils Absolute 1.70 (*)    All other components within normal limits  GGT - Abnormal; Notable for the following components:   GGT 406 (*)    All other components within normal limits        Radiology    Differential Diagnosis  Differential Diagnosis includes depression, suicidal  attempt, anxiety, psychosis, delerium, drug/alcohol abuse/dependence, schizophrenia, adjustment reaction, malingering   EKG   ED Course   ED Course as of 08/30/23 2357  Mon Aug 30, 2023  1358 The patient has been placed in psychiatric observation due to the need to provide a safe environment for the patient while obtaining psychiatric consultation and evaluation, as well as ongoing medical and medication management to treat the patient's condition.  The patient has not been placed under full IVC at this time.   [KL]  1359 CBC, CMP for leukocytosis, leukopenia, anemia, thrombocytopenia hyponatremia, hypernatremia, hypokalemia, hyperkalemia, acidosis, acute kidney injury, hyperglycemia, acute liver injury. EtOH ordered to evaluate for alcohol intoxication.  UDS ordered to evaluate for drugs of abuse. Urinalysis ordered to evaluate for hematuria, urinary infection, proteinuria glucosuria or dehydration. [KL]  1359 My interpretation of lab work shows mild leukopenia with white count of 3.44 and hemoglobin of 12.9 with a normal MCV indicating normocytic anemia.  CMP has normal electrolytes, renal function and liver function.  Urinalysis shows no evidence of infection.  UDS is positive for cocaine only.  No other drugs of abuse.  EtOH is elevated at 337 mg/dL.  GGT is pending at this time. [KL]    ED Course User Index [KL] Franky Rosalynn Fleischer, MD    Procedure Note  Procedures   Clinical Complexity  Patient's presentation is most consistent with acute presentation with potential threat to life or bodily function.  OTC medications advised and discussed with patient include none.  Prescription medications discussed: none  Decision to admit or increased level of care requiring transfer: While I initially considered hospital admission, based on reassuring ED work-up and response to treatment, I feel that patient is appropriate for discharge home with follow-up.  Strict return precautions regarding  this ED visit were discussed.  Patient's alcoholism/drug addiction increases the complexity of managing their presentation and will complicate their treatment plans. I have addressed this by have requested Ateam evaluation to help with possible inpatient detox or mental health evaluation.   Pt presentation is complicated by their history of Substance use, resulting in increased risk of frequent ED usage  Discussed options for workup with patient including risks and benefits via shared decision making and decided to proceed with workup. Medical Decision Making  Medical Decision Making Problems Addressed: Alcohol use disorder: chronic illness or injury with exacerbation, progression, or side effects of treatment that poses a threat to life or bodily functions Anemia, unspecified type: complicated acute illness or injury Cocaine use: chronic illness or injury with exacerbation, progression, or side effects of treatment that poses a threat to life or bodily functions Leukopenia, unspecified type: complicated acute illness or injury  Amount and/or Complexity of Data Reviewed External Data Reviewed: labs and notes.    Details: Reviewed the medical records from [facility] emergency department on 08/27/2023 where patient was seen for intoxication and concern about homelessness and evaluated by Fonda KANDICE Law, MD. Patient's alcohol level was 344 on Aug 11, 2023 Labs: ordered. Decision-making details documented in ED Course.    Details: My interpretation of lab work shows mild leukopenia with white count of 3.44 and hemoglobin of 12.9 with a normal MCV indicating normocytic anemia.  CMP has normal electrolytes, renal function and liver function.  Urinalysis shows no evidence of infection.  UDS is positive for cocaine only.  No other drugs of abuse.  EtOH is elevated at 337 mg/dL.  GGT is pending at this time. Discussion of management or test interpretation with external provider(s): Case was discussed with  the assessment team and psychiatric team.  Patient will be discharged per the psychiatric team.  Risk OTC drugs. Prescription drug management. Decision regarding hospitalization. Diagnosis or treatment significantly limited by social determinants of health.     Please see ED course for MDM ED Clinical Impression   1. Alcohol use disorder Active  2. Leukopenia, unspecified type Active  3. Anemia, unspecified type Active  4. Cocaine use Active    ED Assessment/Plan  Pt is  a 43 y.o. male who presented with request for psychiatric evaluation for substance use. Pt appears to have homelessness in addition to chronic cocaine and alcohol abuse. Plan is medical clearance followed by psychiatric evaluation and disposition per psych.  This document was created using the aid of voice recognition Scientist, clinical (histocompatibility and immunogenetics).    DISCHARGE MEDICATIONS   Medication List     START taking these medications    thiamine  100 mg tablet Commonly known as: VITAMIN B1 Take 1 tablet (100 mg total) by mouth daily.         Where to Get Your Medications     You can get these medications from any pharmacy   Bring a paper prescription for each of these medications thiamine  100 mg tablet     FOLLOW UP [facility] [address] [place] [address] [phone]  In 2 days   [facility] - [facility] - Outpatient Treatment-findhelp [address] [place]  [address] [phone] Go to    [facility] -  EMERGENCY DEPARTMENT [address] [place]  [address] [phone]  If symptoms worsen   ED Disposition     ED Disposition  Discharge   Condition  Stable   Comment  --              [1] No past medical history on file. [2] No past surgical history on file. [3] No Known Allergies [4] No family history on file.

## 2023-08-30 NOTE — ED Triage Notes (Signed)
 Patient is here for detox from cocaine and alcohol. Patient states he last used this AM.

## 2023-09-11 ENCOUNTER — Emergency Department (HOSPITAL_COMMUNITY)

## 2023-09-11 ENCOUNTER — Other Ambulatory Visit: Payer: Self-pay

## 2023-09-11 ENCOUNTER — Emergency Department (HOSPITAL_COMMUNITY)
Admission: EM | Admit: 2023-09-11 | Discharge: 2023-09-11 | Disposition: A | Discharge status: Home / Self Care | Attending: Emergency Medicine | Admitting: Emergency Medicine

## 2023-09-11 ENCOUNTER — Encounter (HOSPITAL_COMMUNITY): Payer: Self-pay

## 2023-09-11 DIAGNOSIS — S3991XA Unspecified injury of abdomen, initial encounter: Secondary | ICD-10-CM | POA: Diagnosis not present

## 2023-09-11 DIAGNOSIS — S0511XA Contusion of eyeball and orbital tissues, right eye, initial encounter: Secondary | ICD-10-CM | POA: Diagnosis not present

## 2023-09-11 DIAGNOSIS — S3993XA Unspecified injury of pelvis, initial encounter: Secondary | ICD-10-CM | POA: Diagnosis not present

## 2023-09-11 DIAGNOSIS — S022XXK Fracture of nasal bones, subsequent encounter for fracture with nonunion: Secondary | ICD-10-CM | POA: Diagnosis not present

## 2023-09-11 DIAGNOSIS — Z5900 Homelessness unspecified: Secondary | ICD-10-CM | POA: Diagnosis not present

## 2023-09-11 DIAGNOSIS — R109 Unspecified abdominal pain: Secondary | ICD-10-CM | POA: Diagnosis not present

## 2023-09-11 DIAGNOSIS — S299XXA Unspecified injury of thorax, initial encounter: Secondary | ICD-10-CM | POA: Diagnosis not present

## 2023-09-11 DIAGNOSIS — Y9339 Activity, other involving climbing, rappelling and jumping off: Secondary | ICD-10-CM | POA: Insufficient documentation

## 2023-09-11 DIAGNOSIS — F17210 Nicotine dependence, cigarettes, uncomplicated: Secondary | ICD-10-CM | POA: Diagnosis not present

## 2023-09-11 DIAGNOSIS — S0240FK Zygomatic fracture, left side, subsequent encounter for fracture with nonunion: Secondary | ICD-10-CM | POA: Diagnosis not present

## 2023-09-11 DIAGNOSIS — K5730 Diverticulosis of large intestine without perforation or abscess without bleeding: Secondary | ICD-10-CM | POA: Diagnosis not present

## 2023-09-11 DIAGNOSIS — S0285XK Fracture of orbit, unspecified, subsequent encounter for fracture with nonunion: Secondary | ICD-10-CM | POA: Diagnosis not present

## 2023-09-11 DIAGNOSIS — S0990XA Unspecified injury of head, initial encounter: Secondary | ICD-10-CM | POA: Diagnosis not present

## 2023-09-11 DIAGNOSIS — S0993XA Unspecified injury of face, initial encounter: Secondary | ICD-10-CM | POA: Diagnosis not present

## 2023-09-11 DIAGNOSIS — H5711 Ocular pain, right eye: Secondary | ICD-10-CM | POA: Diagnosis present

## 2023-09-11 DIAGNOSIS — S06320A Contusion and laceration of left cerebrum without loss of consciousness, initial encounter: Secondary | ICD-10-CM | POA: Diagnosis not present

## 2023-09-11 DIAGNOSIS — S0285XA Fracture of orbit, unspecified, initial encounter for closed fracture: Secondary | ICD-10-CM | POA: Diagnosis not present

## 2023-09-11 DIAGNOSIS — R220 Localized swelling, mass and lump, head: Secondary | ICD-10-CM | POA: Diagnosis not present

## 2023-09-11 LAB — CBC
HCT: 36.2 % — ABNORMAL LOW (ref 39.0–52.0)
Hemoglobin: 12.5 g/dL — ABNORMAL LOW (ref 13.0–17.0)
MCH: 32.2 pg (ref 26.0–34.0)
MCHC: 34.5 g/dL (ref 30.0–36.0)
MCV: 93.3 fL (ref 80.0–100.0)
Platelets: 229 10*3/uL (ref 150–400)
RBC: 3.88 MIL/uL — ABNORMAL LOW (ref 4.22–5.81)
RDW: 13 % (ref 11.5–15.5)
WBC: 5.1 10*3/uL (ref 4.0–10.5)
nRBC: 0 % (ref 0.0–0.2)

## 2023-09-11 LAB — URINALYSIS, ROUTINE W REFLEX MICROSCOPIC
Bilirubin Urine: NEGATIVE
Glucose, UA: NEGATIVE mg/dL
Hgb urine dipstick: NEGATIVE
Ketones, ur: NEGATIVE mg/dL
Leukocytes,Ua: NEGATIVE
Nitrite: NEGATIVE
Protein, ur: NEGATIVE mg/dL
Specific Gravity, Urine: 1.029 (ref 1.005–1.030)
pH: 5 (ref 5.0–8.0)

## 2023-09-11 LAB — COMPREHENSIVE METABOLIC PANEL WITH GFR
ALT: 21 U/L (ref 0–44)
AST: 37 U/L (ref 15–41)
Albumin: 3.8 g/dL (ref 3.5–5.0)
Alkaline Phosphatase: 52 U/L (ref 38–126)
Anion gap: 14 (ref 5–15)
BUN: 9 mg/dL (ref 6–20)
CO2: 20 mmol/L — ABNORMAL LOW (ref 22–32)
Calcium: 9.1 mg/dL (ref 8.9–10.3)
Chloride: 105 mmol/L (ref 98–111)
Creatinine, Ser: 0.87 mg/dL (ref 0.61–1.24)
GFR, Estimated: >60 mL/min (ref >60)
Glucose, Bld: 92 mg/dL (ref 70–99)
Potassium: 3.8 mmol/L (ref 3.5–5.1)
Sodium: 139 mmol/L (ref 135–145)
Total Bilirubin: 0.3 mg/dL (ref 0.0–1.2)
Total Protein: 6.8 g/dL (ref 6.5–8.1)

## 2023-09-11 MED ORDER — IBUPROFEN 600 MG PO TABS: 600.0000 mg | ORAL_TABLET | Freq: Four times a day (QID) | ORAL | 0 refills | Status: DC | PRN

## 2023-09-11 MED ORDER — IOHEXOL 350 MG/ML SOLN
75.0000 mL | Freq: Once | INTRAVENOUS | Status: AC | PRN
  Administered 2023-09-11: 75 mL via INTRAVENOUS

## 2023-09-11 MED ORDER — CYCLOBENZAPRINE HCL 10 MG PO TABS
10.0000 mg | ORAL_TABLET | Freq: Two times a day (BID) | ORAL | 0 refills | Status: DC | PRN
Start: 2023-09-11 — End: 2023-09-26

## 2023-09-11 MED ORDER — HYDROCODONE-ACETAMINOPHEN 5-325 MG PO TABS
1.0000 | ORAL_TABLET | Freq: Once | ORAL | Status: AC
  Administered 2023-09-11: 1 via ORAL
  Filled 2023-09-11: qty 1

## 2023-09-11 MED ORDER — MORPHINE SULFATE (PF) 4 MG/ML IV SOLN
4.0000 mg | Freq: Once | INTRAVENOUS | Status: AC
  Administered 2023-09-11: 4 mg via INTRAVENOUS
  Filled 2023-09-11: qty 1

## 2023-09-11 MED ORDER — OXYCODONE-ACETAMINOPHEN 5-325 MG PO TABS: 1.0000 | ORAL_TABLET | Freq: Four times a day (QID) | ORAL | 0 refills | Status: DC | PRN

## 2023-09-11 MED ORDER — ONDANSETRON HCL 4 MG/2ML IJ SOLN
4.0000 mg | Freq: Once | INTRAMUSCULAR | Status: AC
  Administered 2023-09-11: 4 mg via INTRAVENOUS
  Filled 2023-09-11: qty 2

## 2023-09-11 MED ORDER — SODIUM CHLORIDE 0.9 % IV BOLUS
1000.0000 mL | Freq: Once | INTRAVENOUS | Status: AC
  Administered 2023-09-11: 1000 mL via INTRAVENOUS

## 2023-09-11 MED ORDER — MORPHINE SULFATE (PF) 2 MG/ML IV SOLN
2.0000 mg | Freq: Once | INTRAVENOUS | Status: AC
  Administered 2023-09-11: 2 mg via INTRAVENOUS
  Filled 2023-09-11: qty 1
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
## 2023-09-11 NOTE — ED Notes (Signed)
 Patient transported to CT
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
## 2023-09-11 NOTE — ED Notes (Addendum)
 Pt has swollen left cheek with very small abrasion.  Right eye swollen with blood involvement; pt states he cannot see anything out of right eye.  No acute distress at this time.

## 2023-09-11 NOTE — ED Notes (Signed)
 Awaiting patient from lobby.

## 2023-09-11 NOTE — ED Notes (Signed)
 Pt given urinal and this RN requested urine for urinalysis.  Pt aware.

## 2023-09-13 ENCOUNTER — Encounter (HOSPITAL_COMMUNITY): Payer: Self-pay | Admitting: Emergency Medicine

## 2023-09-13 ENCOUNTER — Other Ambulatory Visit: Payer: Self-pay

## 2023-09-13 ENCOUNTER — Emergency Department (HOSPITAL_COMMUNITY)
Admission: EM | Admit: 2023-09-13 | Discharge: 2023-09-13 | Disposition: A | Discharge status: Home / Self Care | Attending: Emergency Medicine | Admitting: Emergency Medicine

## 2023-09-13 DIAGNOSIS — Y92524 Gas station as the place of occurrence of the external cause: Secondary | ICD-10-CM | POA: Insufficient documentation

## 2023-09-13 DIAGNOSIS — T7411XA Adult physical abuse, confirmed, initial encounter: Secondary | ICD-10-CM | POA: Diagnosis not present

## 2023-09-13 DIAGNOSIS — F1090 Alcohol use, unspecified, uncomplicated: Secondary | ICD-10-CM | POA: Insufficient documentation

## 2023-09-13 DIAGNOSIS — R1084 Generalized abdominal pain: Secondary | ICD-10-CM | POA: Diagnosis not present

## 2023-09-13 DIAGNOSIS — G4489 Other headache syndrome: Secondary | ICD-10-CM | POA: Diagnosis not present

## 2023-09-13 DIAGNOSIS — I10 Essential (primary) hypertension: Secondary | ICD-10-CM | POA: Diagnosis not present
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
## 2023-09-21 ENCOUNTER — Other Ambulatory Visit: Payer: Self-pay

## 2023-09-21 ENCOUNTER — Ambulatory Visit (HOSPITAL_COMMUNITY)
Admission: EM | Admit: 2023-09-21 | Discharge: 2023-09-22 | Disposition: A | Discharge status: Other Acute Inpatient Hospital | Attending: Psychiatry | Admitting: Psychiatry

## 2023-09-21 DIAGNOSIS — F411 Generalized anxiety disorder: Secondary | ICD-10-CM | POA: Insufficient documentation

## 2023-09-21 DIAGNOSIS — F1090 Alcohol use, unspecified, uncomplicated: Secondary | ICD-10-CM

## 2023-09-21 DIAGNOSIS — G4700 Insomnia, unspecified: Secondary | ICD-10-CM | POA: Insufficient documentation

## 2023-09-21 DIAGNOSIS — Z5900 Homelessness unspecified: Secondary | ICD-10-CM | POA: Insufficient documentation

## 2023-09-21 DIAGNOSIS — F1423 Cocaine dependence with withdrawal: Secondary | ICD-10-CM | POA: Insufficient documentation

## 2023-09-21 DIAGNOSIS — F32A Depression, unspecified: Secondary | ICD-10-CM | POA: Insufficient documentation

## 2023-09-21 DIAGNOSIS — F1024 Alcohol dependence with alcohol-induced mood disorder: Secondary | ICD-10-CM | POA: Insufficient documentation

## 2023-09-21 DIAGNOSIS — G8929 Other chronic pain: Secondary | ICD-10-CM | POA: Insufficient documentation

## 2023-09-21 DIAGNOSIS — G629 Polyneuropathy, unspecified: Secondary | ICD-10-CM | POA: Insufficient documentation

## 2023-09-21 DIAGNOSIS — Z6332 Other absence of family member: Secondary | ICD-10-CM | POA: Insufficient documentation

## 2023-09-21 DIAGNOSIS — F1420 Cocaine dependence, uncomplicated: Secondary | ICD-10-CM | POA: Insufficient documentation

## 2023-09-21 DIAGNOSIS — Z87820 Personal history of traumatic brain injury: Secondary | ICD-10-CM | POA: Insufficient documentation

## 2023-09-21 DIAGNOSIS — I452 Bifascicular block: Secondary | ICD-10-CM | POA: Insufficient documentation

## 2023-09-21 DIAGNOSIS — F10230 Alcohol dependence with withdrawal, uncomplicated: Secondary | ICD-10-CM | POA: Insufficient documentation

## 2023-09-21 DIAGNOSIS — Z79899 Other long term (current) drug therapy: Secondary | ICD-10-CM | POA: Insufficient documentation

## 2023-09-21 DIAGNOSIS — F332 Major depressive disorder, recurrent severe without psychotic features: Secondary | ICD-10-CM | POA: Insufficient documentation

## 2023-09-21 DIAGNOSIS — Y908 Blood alcohol level of 240 mg/100 ml or more: Secondary | ICD-10-CM | POA: Insufficient documentation

## 2023-09-21 LAB — CBC WITH DIFFERENTIAL/PLATELET
Abs Immature Granulocytes: 0.01 K/uL (ref 0.00–0.07)
Basophils Absolute: 0.1 K/uL (ref 0.0–0.1)
Basophils Relative: 1 %
Eosinophils Absolute: 0 K/uL (ref 0.0–0.5)
Eosinophils Relative: 1 %
HCT: 39.7 % (ref 39.0–52.0)
Hemoglobin: 13.6 g/dL (ref 13.0–17.0)
Immature Granulocytes: 0 %
Lymphocytes Relative: 47 %
Lymphs Abs: 1.8 K/uL (ref 0.7–4.0)
MCH: 31.6 pg (ref 26.0–34.0)
MCHC: 34.3 g/dL (ref 30.0–36.0)
MCV: 92.3 fL (ref 80.0–100.0)
Monocytes Absolute: 0.3 K/uL (ref 0.1–1.0)
Monocytes Relative: 9 %
Neutro Abs: 1.6 K/uL — ABNORMAL LOW (ref 1.7–7.7)
Neutrophils Relative %: 42 %
Platelets: 289 K/uL (ref 150–400)
RBC: 4.3 MIL/uL (ref 4.22–5.81)
RDW: 13.2 % (ref 11.5–15.5)
WBC: 3.8 K/uL — ABNORMAL LOW (ref 4.0–10.5)
nRBC: 0 % (ref 0.0–0.2)

## 2023-09-21 LAB — POCT URINE DRUG SCREEN - MANUAL ENTRY (I-SCREEN)
POC Amphetamine UR: NOT DETECTED
POC Buprenorphine (BUP): NOT DETECTED
POC Cocaine UR: POSITIVE — AB
POC Marijuana UR: NOT DETECTED
POC Methadone UR: NOT DETECTED
POC Methamphetamine UR: NOT DETECTED
POC Morphine: NOT DETECTED
POC Oxazepam (BZO): NOT DETECTED
POC Oxycodone UR: NOT DETECTED
POC Secobarbital (BAR): NOT DETECTED

## 2023-09-21 LAB — URINALYSIS, ROUTINE W REFLEX MICROSCOPIC
Bilirubin Urine: NEGATIVE
Glucose, UA: NEGATIVE mg/dL
Hgb urine dipstick: NEGATIVE
Ketones, ur: NEGATIVE mg/dL
Leukocytes,Ua: NEGATIVE
Nitrite: NEGATIVE
Protein, ur: NEGATIVE mg/dL
Specific Gravity, Urine: 1.01 (ref 1.005–1.030)
pH: 5 (ref 5.0–8.0)

## 2023-09-21 LAB — COMPREHENSIVE METABOLIC PANEL WITH GFR
ALT: 26 U/L (ref 0–44)
AST: 37 U/L (ref 15–41)
Albumin: 4.3 g/dL (ref 3.5–5.0)
Alkaline Phosphatase: 63 U/L (ref 38–126)
Anion gap: 11 (ref 5–15)
BUN: 8 mg/dL (ref 6–20)
CO2: 26 mmol/L (ref 22–32)
Calcium: 9.2 mg/dL (ref 8.9–10.3)
Chloride: 103 mmol/L (ref 98–111)
Creatinine, Ser: 0.83 mg/dL (ref 0.61–1.24)
GFR, Estimated: >60 mL/min (ref >60)
Glucose, Bld: 96 mg/dL (ref 70–99)
Potassium: 4.3 mmol/L (ref 3.5–5.1)
Sodium: 140 mmol/L (ref 135–145)
Total Bilirubin: 0.4 mg/dL (ref 0.0–1.2)
Total Protein: 7.7 g/dL (ref 6.5–8.1)

## 2023-09-21 LAB — LIPID PANEL
Cholesterol: 240 mg/dL — ABNORMAL HIGH (ref 0–200)
HDL: 115 mg/dL (ref >40)
LDL Cholesterol: 106 mg/dL — ABNORMAL HIGH (ref 0–99)
Total CHOL/HDL Ratio: 2.1 ratio
Triglycerides: 97 mg/dL (ref <150)
VLDL: 19 mg/dL (ref 0–40)

## 2023-09-21 LAB — HEMOGLOBIN A1C
Hgb A1c MFr Bld: 5.2 % (ref 4.8–5.6)
Mean Plasma Glucose: 102.54 mg/dL

## 2023-09-21 LAB — TSH: TSH: 1.505 u[IU]/mL (ref 0.350–4.500)

## 2023-09-21 LAB — VITAMIN B12: Vitamin B-12: 290 pg/mL (ref 180–914)

## 2023-09-21 LAB — ETHANOL: Alcohol, Ethyl (B): 297 mg/dL — ABNORMAL HIGH (ref <15)

## 2023-09-21 LAB — VITAMIN D 25 HYDROXY (VIT D DEFICIENCY, FRACTURES): Vit D, 25-Hydroxy: 14.75 ng/mL — ABNORMAL LOW (ref 30–100)

## 2023-09-21 MED ORDER — HALOPERIDOL 5 MG PO TABS: 5.0000 mg | ORAL_TABLET | Freq: Three times a day (TID) | ORAL | Status: DC | PRN

## 2023-09-21 MED ORDER — ESCITALOPRAM OXALATE 5 MG PO TABS
5.0000 mg | ORAL_TABLET | Freq: Every day | ORAL | Status: DC
  Administered 2023-09-21: 5 mg via ORAL
  Filled 2023-09-21: qty 1

## 2023-09-21 MED ORDER — ALUM & MAG HYDROXIDE-SIMETH 200-200-20 MG/5ML PO SUSP: 30.0000 mL | ORAL | Status: DC | PRN

## 2023-09-21 MED ORDER — HALOPERIDOL LACTATE 5 MG/ML IJ SOLN: 5.0000 mg | Freq: Three times a day (TID) | INTRAMUSCULAR | Status: DC | PRN

## 2023-09-21 MED ORDER — LORAZEPAM 2 MG/ML IJ SOLN: 2.0000 mg | Freq: Three times a day (TID) | INTRAMUSCULAR | Status: DC | PRN

## 2023-09-21 MED ORDER — DIPHENHYDRAMINE HCL 50 MG/ML IJ SOLN: 50.0000 mg | Freq: Three times a day (TID) | INTRAMUSCULAR | Status: DC | PRN

## 2023-09-21 MED ORDER — TRAZODONE HCL 50 MG PO TABS
50.0000 mg | ORAL_TABLET | Freq: Every evening | ORAL | Status: DC | PRN
  Administered 2023-09-21: 50 mg via ORAL
  Filled 2023-09-21: qty 1

## 2023-09-21 MED ORDER — CHLORDIAZEPOXIDE HCL 25 MG PO CAPS: 25.0000 mg | ORAL_CAPSULE | Freq: Four times a day (QID) | ORAL | Status: DC | PRN

## 2023-09-21 MED ORDER — THIAMINE HCL 100 MG/ML IJ SOLN
100.0000 mg | Freq: Once | INTRAMUSCULAR | Status: DC
  Filled 2023-09-21: qty 2

## 2023-09-21 MED ORDER — HYDROXYZINE HCL 25 MG PO TABS: 25.0000 mg | ORAL_TABLET | Freq: Four times a day (QID) | ORAL | Status: DC | PRN

## 2023-09-21 MED ORDER — GABAPENTIN 100 MG PO CAPS
100.0000 mg | ORAL_CAPSULE | Freq: Three times a day (TID) | ORAL | Status: DC
  Administered 2023-09-21 – 2023-09-22 (×3): 100 mg via ORAL
  Filled 2023-09-21 (×3): qty 1

## 2023-09-21 MED ORDER — CHLORDIAZEPOXIDE HCL 25 MG PO CAPS: 25.0000 mg | ORAL_CAPSULE | Freq: Three times a day (TID) | ORAL | Status: DC

## 2023-09-21 MED ORDER — NICOTINE 14 MG/24HR TD PT24
14.0000 mg | MEDICATED_PATCH | Freq: Every day | TRANSDERMAL | Status: DC
  Administered 2023-09-22: 14 mg via TRANSDERMAL
  Filled 2023-09-21: qty 1

## 2023-09-21 MED ORDER — LOPERAMIDE HCL 2 MG PO CAPS: 2.0000 mg | ORAL_CAPSULE | ORAL | Status: DC | PRN

## 2023-09-21 MED ORDER — CHLORDIAZEPOXIDE HCL 25 MG PO CAPS: 25.0000 mg | ORAL_CAPSULE | ORAL | Status: DC

## 2023-09-21 MED ORDER — CHLORDIAZEPOXIDE HCL 25 MG PO CAPS: 25.0000 mg | ORAL_CAPSULE | Freq: Every day | ORAL | Status: DC

## 2023-09-21 MED ORDER — HYDROXYZINE HCL 25 MG PO TABS: 25.0000 mg | ORAL_TABLET | Freq: Three times a day (TID) | ORAL | Status: DC | PRN

## 2023-09-21 MED ORDER — DIPHENHYDRAMINE HCL 50 MG PO CAPS: 50.0000 mg | ORAL_CAPSULE | Freq: Three times a day (TID) | ORAL | Status: DC | PRN

## 2023-09-21 MED ORDER — HALOPERIDOL LACTATE 5 MG/ML IJ SOLN: 10.0000 mg | Freq: Three times a day (TID) | INTRAMUSCULAR | Status: DC | PRN

## 2023-09-21 MED ORDER — ADULT MULTIVITAMIN W/MINERALS CH
1.0000 | ORAL_TABLET | Freq: Every day | ORAL | Status: DC
  Administered 2023-09-21 – 2023-09-22 (×2): 1 via ORAL
  Filled 2023-09-21 (×2): qty 1

## 2023-09-21 MED ORDER — MAGNESIUM HYDROXIDE 400 MG/5ML PO SUSP: 30.0000 mL | Freq: Every day | ORAL | Status: DC | PRN

## 2023-09-21 MED ORDER — CHLORDIAZEPOXIDE HCL 25 MG PO CAPS
25.0000 mg | ORAL_CAPSULE | Freq: Four times a day (QID) | ORAL | Status: AC
  Administered 2023-09-21 – 2023-09-22 (×4): 25 mg via ORAL
  Filled 2023-09-21 (×4): qty 1

## 2023-09-21 MED ORDER — ONDANSETRON 4 MG PO TBDP: 4.0000 mg | ORAL_TABLET | Freq: Four times a day (QID) | ORAL | Status: DC | PRN

## 2023-09-21 MED ORDER — ACETAMINOPHEN 325 MG PO TABS: 650.0000 mg | ORAL_TABLET | Freq: Four times a day (QID) | ORAL | Status: DC | PRN

## 2023-09-21 NOTE — ED Provider Notes (Cosign Needed Addendum)
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 differently: Take 600 mg by mouth every 6 (six) hours as needed (For pain).)   Medical Decision Making  -Admit to the observation area pending bed availability on the facility based crisis unit. - Initiate Librium  taper for alcohol use disorder -Lexapro  5 mg nightly for depressive symptoms  -Gabapentin  100 mg TID for GAD/ETOH use d/o - Supportive medications as needed: Multivitamins, MOM, Maalox, Tylenol , B12, zofran  as ordered on the MAR. -Trazodone  50 mg nightly prn for sleep  Recommendations  FBC recommended, admitted to Obs pending bed availability on FBC.  Donia Snell, NP 09/21/23  4:07 PM
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
## 2023-09-21 NOTE — ED Notes (Signed)
 While in the EKG room in the hallway, patient proceeds to say to me, another tech and RN that if any of you have a boyfriend/husband they would be lucky to have you

## 2023-09-21 NOTE — ED Notes (Signed)
 Patient resting in lounger with eyes closed, respirations even and unlabored. Patient in no apparent acute distress. Environment secured. Safety checks in place per facility protocol.
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
## 2023-09-22 ENCOUNTER — Encounter (HOSPITAL_COMMUNITY): Payer: Self-pay | Admitting: Psychiatry

## 2023-09-22 ENCOUNTER — Other Ambulatory Visit (HOSPITAL_COMMUNITY)
Admission: EM | Admit: 2023-09-22 | Discharge: 2023-09-27 | Disposition: A | Discharge status: Home / Self Care | Source: Intra-hospital | Attending: Psychiatry | Admitting: Psychiatry

## 2023-09-22 DIAGNOSIS — F32A Depression, unspecified: Secondary | ICD-10-CM | POA: Diagnosis not present

## 2023-09-22 DIAGNOSIS — F332 Major depressive disorder, recurrent severe without psychotic features: Secondary | ICD-10-CM

## 2023-09-22 DIAGNOSIS — F1024 Alcohol dependence with alcohol-induced mood disorder: Secondary | ICD-10-CM | POA: Diagnosis not present

## 2023-09-22 DIAGNOSIS — G629 Polyneuropathy, unspecified: Secondary | ICD-10-CM | POA: Insufficient documentation

## 2023-09-22 DIAGNOSIS — F1423 Cocaine dependence with withdrawal: Secondary | ICD-10-CM

## 2023-09-22 DIAGNOSIS — Z79899 Other long term (current) drug therapy: Secondary | ICD-10-CM | POA: Insufficient documentation

## 2023-09-22 DIAGNOSIS — I452 Bifascicular block: Secondary | ICD-10-CM | POA: Insufficient documentation

## 2023-09-22 DIAGNOSIS — Z6332 Other absence of family member: Secondary | ICD-10-CM | POA: Insufficient documentation

## 2023-09-22 DIAGNOSIS — F10230 Alcohol dependence with withdrawal, uncomplicated: Secondary | ICD-10-CM | POA: Insufficient documentation

## 2023-09-22 DIAGNOSIS — Z5900 Homelessness unspecified: Secondary | ICD-10-CM | POA: Insufficient documentation

## 2023-09-22 DIAGNOSIS — G8929 Other chronic pain: Secondary | ICD-10-CM | POA: Insufficient documentation

## 2023-09-22 DIAGNOSIS — Z87820 Personal history of traumatic brain injury: Secondary | ICD-10-CM | POA: Insufficient documentation

## 2023-09-22 DIAGNOSIS — G4700 Insomnia, unspecified: Secondary | ICD-10-CM | POA: Insufficient documentation

## 2023-09-22 DIAGNOSIS — F411 Generalized anxiety disorder: Secondary | ICD-10-CM

## 2023-09-22 DIAGNOSIS — F1420 Cocaine dependence, uncomplicated: Secondary | ICD-10-CM | POA: Diagnosis not present

## 2023-09-22 DIAGNOSIS — F1020 Alcohol dependence, uncomplicated: Secondary | ICD-10-CM | POA: Diagnosis present

## 2023-09-22 MED ORDER — TRAZODONE HCL 50 MG PO TABS
50.0000 mg | ORAL_TABLET | Freq: Every day | ORAL | Status: DC
  Administered 2023-09-22 – 2023-09-24 (×2): 50 mg via ORAL
  Filled 2023-09-22 (×3): qty 1

## 2023-09-22 MED ORDER — CHLORDIAZEPOXIDE HCL 25 MG PO CAPS
25.0000 mg | ORAL_CAPSULE | Freq: Every day | ORAL | Status: AC
  Administered 2023-09-24: 25 mg via ORAL
  Filled 2023-09-22: qty 1

## 2023-09-22 MED ORDER — LORAZEPAM 2 MG/ML IJ SOLN: 2.0000 mg | Freq: Three times a day (TID) | INTRAMUSCULAR | Status: DC | PRN

## 2023-09-22 MED ORDER — CHLORDIAZEPOXIDE HCL 25 MG PO CAPS
25.0000 mg | ORAL_CAPSULE | Freq: Three times a day (TID) | ORAL | Status: AC
  Administered 2023-09-22 – 2023-09-23 (×3): 25 mg via ORAL
  Filled 2023-09-22 (×3): qty 1

## 2023-09-22 MED ORDER — ONDANSETRON 4 MG PO TBDP: 4.0000 mg | ORAL_TABLET | Freq: Four times a day (QID) | ORAL | Status: AC | PRN

## 2023-09-22 MED ORDER — LOPERAMIDE HCL 2 MG PO CAPS: 2.0000 mg | ORAL_CAPSULE | ORAL | Status: AC | PRN

## 2023-09-22 MED ORDER — ALUM & MAG HYDROXIDE-SIMETH 200-200-20 MG/5ML PO SUSP: 30.0000 mL | ORAL | Status: DC | PRN

## 2023-09-22 MED ORDER — DIPHENHYDRAMINE HCL 50 MG/ML IJ SOLN: 50.0000 mg | Freq: Three times a day (TID) | INTRAMUSCULAR | Status: DC | PRN

## 2023-09-22 MED ORDER — ACETAMINOPHEN 325 MG PO TABS
650.0000 mg | ORAL_TABLET | Freq: Four times a day (QID) | ORAL | Status: DC | PRN
  Filled 2023-09-22: qty 2

## 2023-09-22 MED ORDER — HALOPERIDOL 5 MG PO TABS: 5.0000 mg | ORAL_TABLET | Freq: Three times a day (TID) | ORAL | Status: DC | PRN

## 2023-09-22 MED ORDER — DIPHENHYDRAMINE HCL 50 MG PO CAPS: 50.0000 mg | ORAL_CAPSULE | Freq: Three times a day (TID) | ORAL | Status: DC | PRN

## 2023-09-22 MED ORDER — HALOPERIDOL LACTATE 5 MG/ML IJ SOLN: 10.0000 mg | Freq: Three times a day (TID) | INTRAMUSCULAR | Status: DC | PRN

## 2023-09-22 MED ORDER — ADULT MULTIVITAMIN W/MINERALS CH
1.0000 | ORAL_TABLET | Freq: Every day | ORAL | Status: DC
  Administered 2023-09-23 – 2023-09-27 (×5): 1 via ORAL
  Filled 2023-09-22 (×5): qty 1

## 2023-09-22 MED ORDER — MAGNESIUM HYDROXIDE 400 MG/5ML PO SUSP: 30.0000 mL | Freq: Every day | ORAL | Status: DC | PRN

## 2023-09-22 MED ORDER — HYDROXYZINE HCL 25 MG PO TABS: 25.0000 mg | ORAL_TABLET | Freq: Three times a day (TID) | ORAL | Status: DC | PRN

## 2023-09-22 MED ORDER — HYDROXYZINE HCL 25 MG PO TABS: 25.0000 mg | ORAL_TABLET | Freq: Four times a day (QID) | ORAL | Status: DC | PRN

## 2023-09-22 MED ORDER — HALOPERIDOL LACTATE 5 MG/ML IJ SOLN: 5.0000 mg | Freq: Three times a day (TID) | INTRAMUSCULAR | Status: DC | PRN

## 2023-09-22 MED ORDER — TRAZODONE HCL 50 MG PO TABS
50.0000 mg | ORAL_TABLET | Freq: Every evening | ORAL | Status: DC | PRN
  Administered 2023-09-23: 50 mg via ORAL

## 2023-09-22 MED ORDER — NICOTINE 14 MG/24HR TD PT24
14.0000 mg | MEDICATED_PATCH | Freq: Every day | TRANSDERMAL | Status: DC
  Administered 2023-09-23 – 2023-09-27 (×5): 14 mg via TRANSDERMAL
  Filled 2023-09-22 (×5): qty 1

## 2023-09-22 MED ORDER — DULOXETINE HCL 30 MG PO CPEP
30.0000 mg | ORAL_CAPSULE | Freq: Every day | ORAL | Status: DC
  Administered 2023-09-23 – 2023-09-24 (×2): 30 mg via ORAL
  Filled 2023-09-22 (×2): qty 1

## 2023-09-22 MED ORDER — GABAPENTIN 100 MG PO CAPS
100.0000 mg | ORAL_CAPSULE | Freq: Three times a day (TID) | ORAL | Status: DC
  Administered 2023-09-22 – 2023-09-23 (×3): 100 mg via ORAL
  Filled 2023-09-22 (×3): qty 1

## 2023-09-22 MED ORDER — ESCITALOPRAM OXALATE 5 MG PO TABS: 5.0000 mg | ORAL_TABLET | Freq: Every day | ORAL | Status: DC

## 2023-09-22 MED ORDER — CHLORDIAZEPOXIDE HCL 25 MG PO CAPS: 25.0000 mg | ORAL_CAPSULE | Freq: Four times a day (QID) | ORAL | Status: DC | PRN

## 2023-09-22 MED ORDER — CHLORDIAZEPOXIDE HCL 25 MG PO CAPS
25.0000 mg | ORAL_CAPSULE | ORAL | Status: AC
  Administered 2023-09-23 – 2023-09-24 (×2): 25 mg via ORAL
  Filled 2023-09-22 (×2): qty 1

## 2023-09-22 NOTE — ED Notes (Signed)
Pt transferred to [facility]. 
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
## 2023-09-22 NOTE — ED Provider Notes (Signed)
 FBC/OBS ASAP Discharge Summary  Date and Time: 09/22/2023 2:32 PM  Name: Jake Silva  MRN:  [id number]   Discharge Diagnoses:  Final diagnoses:  Alcohol use disorder    Subjective: On interview, patient denies suicidal or homicidal ideation.  Says that he slept on-and-off an felt hot independently.  Patient was afebrile later this morning.  Is interested in inpatient detox.  No auditory or visual hallucinations.  Does not feel as if he is undergoing acute withdrawal right now.  Stay Summary: Patient presented 09/21/2023 after being dropped off) of [facility] by Patent examiner.  Reported recent severe alcohol use and crack cocaine use.  Was begun on Librium  taper.  EtOH: 297.  Was appropriate for [facility] at that time -- state overnight an observation without incident 7/8 an was discharged 7/917 [facility] bed was available.  Total Time spent with patient: 15 minutes  Past Psychiatric History: Unable to assess thoroughly, was discharged early a.m. Past Medical History: Reports cocaine an alcohol use-unable to assess thoroughly. Family History: Denies, per NP note. Social History: Patient has housing instability, works at an under the table job Tobacco Cessation: Not prescribed as patient is transferred to another inpatient unit directly, will be prescribed on discharge if appropriate.  Current Medications:  No current facility-administered medications for this encounter.   Current Outpatient Medications  Medication Sig Dispense Refill   cyclobenzaprine  (FLEXERIL ) 10 MG tablet Take 1 tablet (10 mg total) by mouth 2 (two) times daily as needed for muscle spasms. 20 tablet 0   ibuprofen  (ADVIL ) 600 MG tablet Take 1 tablet (600 mg total) by mouth every 6 (six) hours as needed. (Patient taking differently: Take 600 mg by mouth every 6 (six) hours as needed (For pain).) 30 tablet 0   Facility-Administered Medications Ordered in Other Encounters  Medication Dose Route  Frequency Provider Last Rate Last Admin   acetaminophen  (TYLENOL ) tablet 650 mg  650 mg Oral Q6H PRN Rollene Katz, MD       alum & mag hydroxide-simeth (MAALOX/MYLANTA) 200-200-20 MG/5ML suspension 30 mL  30 mL Oral Q4H PRN Rollene Katz, MD       chlordiazePOXIDE  (LIBRIUM ) capsule 25 mg  25 mg Oral Q6H PRN Rollene Katz, MD       chlordiazePOXIDE  (LIBRIUM ) capsule 25 mg  25 mg Oral TID Rollene Katz, MD       Followed by   NOREEN ON 09/23/2023] chlordiazePOXIDE  (LIBRIUM ) capsule 25 mg  25 mg Oral Letha Rollene Katz, MD       Followed by   NOREEN ON 09/24/2023] chlordiazePOXIDE  (LIBRIUM ) capsule 25 mg  25 mg Oral Daily Rollene Katz, MD       haloperidol  (HALDOL ) tablet 5 mg  5 mg Oral TID PRN Rollene Katz, MD       And   diphenhydrAMINE  (BENADRYL ) capsule 50 mg  50 mg Oral TID PRN Rollene Katz, MD       haloperidol  lactate (HALDOL ) injection 5 mg  5 mg Intramuscular TID PRN Rollene Katz, MD       And   diphenhydrAMINE  (BENADRYL ) injection 50 mg  50 mg Intramuscular TID PRN Rollene Katz, MD       And   LORazepam  (ATIVAN ) injection 2 mg  2 mg Intramuscular TID PRN Rollene Katz, MD       haloperidol  lactate (HALDOL ) injection 10 mg  10 mg Intramuscular TID PRN Rollene Katz, MD       And   diphenhydrAMINE  (BENADRYL ) injection 50  mg  50 mg Intramuscular TID PRN Rollene Katz, MD       And   LORazepam  (ATIVAN ) injection 2 mg  2 mg Intramuscular TID PRN Rollene Katz, MD       NOREEN ON 09/23/2023] DULoxetine  (CYMBALTA ) DR capsule 30 mg  30 mg Oral Daily Zouev, Dmitri, MD       gabapentin  (NEURONTIN ) capsule 100 mg  100 mg Oral TID Rollene Katz, MD       hydrOXYzine  (ATARAX ) tablet 25 mg  25 mg Oral TID PRN Rollene Katz, MD       loperamide  (IMODIUM ) capsule 2-4 mg  2-4 mg Oral PRN Rollene Katz, MD       magnesium  hydroxide (MILK OF MAGNESIA) suspension 30 mL  30 mL Oral Daily PRN Rollene Katz,  MD       NOREEN ON 09/23/2023] multivitamin with minerals tablet 1 tablet  1 tablet Oral Daily Rollene Katz, MD       NOREEN ON 09/23/2023] nicotine  (NICODERM CQ  - dosed in mg/24 hours) patch 14 mg  14 mg Transdermal Q0600 Rollene Katz, MD       ondansetron  (ZOFRAN -ODT) disintegrating tablet 4 mg  4 mg Oral Q6H PRN Rollene Katz, MD       traZODone  (DESYREL ) tablet 50 mg  50 mg Oral QHS PRN Rollene Katz, MD       traZODone  (DESYREL ) tablet 50 mg  50 mg Oral QHS Zouev, Dmitri, MD        PTA Medications:  Facility Ordered Medications  Medication   [COMPLETED] chlordiazePOXIDE  (LIBRIUM ) capsule 25 mg   PTA Medications  Medication Sig   cyclobenzaprine  (FLEXERIL ) 10 MG tablet Take 1 tablet (10 mg total) by mouth 2 (two) times daily as needed for muscle spasms.   ibuprofen  (ADVIL ) 600 MG tablet Take 1 tablet (600 mg total) by mouth every 6 (six) hours as needed. (Patient taking differently: Take 600 mg by mouth every 6 (six) hours as needed (For pain).)        No data to display          Flowsheet Row ED from 09/22/2023 in [facility] ED from 09[facility]21/2023 in [facility] ED from 09/13/2023 in [facility] Department at [facility]  C-SSRS RISK CATEGORY No Risk No Risk No Risk    Musculoskeletal  Strength & Muscle Tone: within normal limits Gait & Station: normal Patient leans: N/A  Psychiatric Specialty Exam  Presentation  General Appearance:  Disheveled  Eye Contact: Fair  Speech: Clear and Coherent  Speech Volume: Normal  Handedness:No data recorded  Mood and Affect  Mood: Euthymic  Affect: Congruent; Full Range   Thought Process  Thought Processes: Coherent  Descriptions of Associations:Intact  Orientation:Full (Time, Place and Person)  Thought Content:Logical  Diagnosis of Schizophrenia or Schizoaffective disorder in past: No    Hallucinations:Hallucinations:  None  Ideas of Reference:None  Suicidal Thoughts:Suicidal Thoughts: No  Homicidal Thoughts:Homicidal Thoughts: No   Sensorium  Memory: Immediate Fair  Judgment: Fair  Insight: Fair   Art therapist  Concentration: Fair  Attention Span: Fair  Recall: Poor  Fund of Knowledge: Poor  Language: Poor   Psychomotor Activity  Psychomotor Activity: Psychomotor Activity: Normal   Assets  Assets: Resilience   Sleep  Sleep: Sleep: Poor  Safety Checks Durations: No data on file for Sleeping  No data recorded  Physical Exam  Physical Exam Vitals reviewed.  Constitutional:      General:  He is not in acute distress.    Appearance: He is not ill-appearing.  Pulmonary:     Effort: Pulmonary effort is normal. No respiratory distress.  Psychiatric:        Mood and Affect: Mood normal.        Behavior: Behavior normal.    Review of Systems  Constitutional:  Positive for chills. Negative for fever.       Said he felt hot/cold, no objective fever.  Gastrointestinal:  Negative for nausea and vomiting.  Psychiatric/Behavioral:  Negative for suicidal ideas.   All other systems reviewed and are negative.  Blood pressure (!) 135/92, pulse 82, temperature 98.2 F (36.8 C), temperature source Oral, resp. rate 18, SpO2 100%. There is no height or weight on file to calculate BMI.  Demographic Factors:  Male, Low socioeconomic status, and Living alone  Loss Factors: Decrease in vocational status, Decline in physical health, and Financial problems/change in socioeconomic status  Historical Factors: NA  Risk Reduction Factors:   Positive social support and Positive therapeutic relationship  Continued Clinical Symptoms:  Alcohol/Substance Abuse/Dependencies  Cognitive Features That Contribute To Risk:  None    Suicide Risk:  Minimal-denied suicidal ideation on discharge from [facility], although he felt as if his life would end if he continue down this  path.  Patient is forward thinking and would like to discontinue alcohol/drug use going forward.  Patient was discharged to another inpatient unit.  Plan Of Care/Follow-up recommendations:   Follow-up recommendations:  Activity:  Normal, as tolerated Diet:  Per PCP recommendation  Patient is instructed prior to discharge to: Take all medications as prescribed by his mental healthcare provider. Report any adverse effects and/or reactions from the medicines to his outpatient provider promptly. Patient has been instructed & cautioned: To not engage in alcohol and or illegal drug use while on prescription medicines.  In the event of worsening symptoms, patient is instructed to call the crisis hotline at 988, 911 and or go to the nearest ED for appropriate evaluation and treatment of symptoms. To follow-up with his primary care provider for your other medical issues, concerns and or health care needs.   Disposition: FBC  Jaima Janney, MD 09/22/2023, 2:32 PM
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
## 2023-09-22 NOTE — Progress Notes (Signed)
 Pt is asleep. Respirations are even and unlabored. No signs of acute distress noted. Staff will monitor for pt's safety.
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
## 2023-09-22 NOTE — ED Notes (Signed)
 Patient is sleeping. Respirations equal and unlabored. No change in assessment or acuity. Routine safety checks conducted according to facility protocol.

## 2023-09-22 NOTE — Discharge Instructions (Signed)

## 2023-09-22 NOTE — ED Notes (Signed)
 Patient attending social work group.  He has been animated on unit social with peers.  No somatic distress.

## 2023-09-22 NOTE — ED Notes (Signed)
 Pt has been observable on the unit.  Pt requesting opioid pain medications by name.  Provider made aware.  Explained to patient that he is here for ETOH detox.  Pt agreeable to receiving Tylenol  for pain at present   Pt is on librium  taper and is being monitored on CIWA protocol. Q 15 minute observations for safety continue

## 2023-09-22 NOTE — ED Provider Notes (Addendum)
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 Final   Creatinine, Ser 07/21/2023 0.90  0.61 - 1.24 mg/dL Final   Calcium 94/92/7974 9.1  8.9 - 10.3 mg/dL Final   Total Protein 94/92/7974 7.3  6.5 - 8.1 g/dL Final   Albumin 94/92/7974 4.0  3.5 - 5.0 g/dL Final   AST 94/92/7974 54 (H)  15 - 41 U/L Final   HEMOLYSIS AT THIS LEVEL MAY AFFECT RESULT   ALT 07/21/2023 33  0 - 44 U/L Final   HEMOLYSIS AT THIS LEVEL MAY AFFECT RESULT   Alkaline Phosphatase 07/21/2023 76  38 - 126 U/L Final   Total Bilirubin  07/21/2023 1.4 (H)  0.0 - 1.2 mg/dL Final   HEMOLYSIS AT THIS LEVEL MAY AFFECT RESULT   GFR, Estimated 07/21/2023 >60  >60 mL/min Final   Comment: (NOTE) Calculated using the CKD-EPI Creatinine Equation (2021)    Anion gap 07/21/2023 14  5 - 15 Final   Performed at [facility], [address] [place], [place] [address]   WBC 07/21/2023 4.3  4.0 - 10.5 K/uL Final   RBC 07/21/2023 4.41  4.22 - 5.81 MIL/uL Final   Hemoglobin 07/21/2023 14.0  13.0 - 17.0 g/dL Final   HCT 94/92/7974 41.9  39.0 - 52.0 % Final   MCV 07/21/2023 95.0  80.0 - 100.0 fL Final   MCH 07/21/2023 31.7  26.0 - 34.0 pg Final   MCHC 07/21/2023 33.4  30.0 - 36.0 g/dL Final   RDW 94/92/7974 12.7  11.5 - 15.5 % Final   Platelets 07/21/2023 234  150 - 400 K/uL Final   nRBC 07/21/2023 0.0  0.0 - 0.2 % Final   Performed at [facility], [address], [place], [place] [address]    Allergies: Patient has no known allergies.  Medications:  Facility Ordered Medications  Medication   [COMPLETED] chlordiazePOXIDE  (LIBRIUM ) capsule 25 mg   acetaminophen  (TYLENOL ) tablet 650 mg   traZODone  (DESYREL ) tablet 50 mg   [START ON 09/23/2023] nicotine  (NICODERM CQ  - dosed in mg/24 hours) patch 14 mg   chlordiazePOXIDE  (LIBRIUM ) capsule 25 mg   hydrOXYzine  (ATARAX ) tablet 25 mg   loperamide  (IMODIUM ) capsule 2-4 mg   ondansetron  (ZOFRAN -ODT) disintegrating tablet 4 mg   chlordiazePOXIDE  (LIBRIUM ) capsule 25 mg   Followed by   NOREEN ON 09/23/2023] chlordiazePOXIDE  (LIBRIUM ) capsule 25 mg   Followed by   NOREEN ON 09/24/2023] chlordiazePOXIDE  (LIBRIUM ) capsule 25 mg   escitalopram  (LEXAPRO ) tablet 5 mg   gabapentin  (NEURONTIN ) capsule 100 mg   hydrOXYzine  (ATARAX ) tablet 25 mg   alum & mag hydroxide-simeth (MAALOX/MYLANTA) 200-200-20 MG/5ML suspension 30 mL   haloperidol  (HALDOL ) tablet 5 mg   And   diphenhydrAMINE  (BENADRYL ) capsule 50 mg   haloperidol  lactate (HALDOL ) injection 5 mg   And   diphenhydrAMINE   (BENADRYL ) injection 50 mg   And   LORazepam  (ATIVAN ) injection 2 mg   haloperidol  lactate (HALDOL ) injection 10 mg   And   diphenhydrAMINE  (BENADRYL ) injection 50 mg   And   LORazepam  (ATIVAN ) injection 2 mg   magnesium  hydroxide (MILK OF MAGNESIA) suspension 30 mL   [START ON 09/23/2023] multivitamin with minerals tablet 1 tablet   PTA Medications  Medication Sig   cyclobenzaprine  (FLEXERIL ) 10 MG tablet Take  1 tablet (10 mg total) by mouth 2 (two) times daily as needed for muscle spasms.   ibuprofen  (ADVIL ) 600 MG tablet Take 1 tablet (600 mg total) by mouth every 6 (six) hours as needed. (Patient taking differently: Take 600 mg by mouth every 6 (six) hours as needed (For pain).)    Long Term Goals: Improvement in symptoms so as ready for discharge  Short Term Goals: Patient will verbalize feelings in meetings with treatment team members., Patient will attend at least of 50% of the groups daily., Pt will complete the PHQ9 on admission, day 3 and discharge., Patient will participate in completing the [place] Suicide Severity Rating Scale, Patient will score a low risk of violence for 24 hours prior to discharge, and Patient will take medications as prescribed daily.  Medical Decision Making  Patient has decision making capacity.   43 y.o. male with a history of cocaine abuse, alcohol abuse, and depression, anxiety who was brought to  [facility] by Patent examiner. No information was provided by law enforcement prior to them leaving the facility.   Patient denies history of psychiatric admissions or suicide attempts. Denies SI or HI today but presented to detox from alcohol and cocaine and start a medication for depression, anxiety and chronic pain. Symptoms most consistent with MDD and GAD based on patient history however cannot rule out substance induced mood disorder. Discussed r/b/a of starting Cymbalta  and gabapentin  and patient is agreeable.   #alcohol  use disorder, severe with withdrawal -continue Librium  taper + CIWA protocol with PRN Librium  -continue gabapentin  100 mg TID for alcohol withdrawal, anxiety  -continue trazodone  50 mg at bedtime-PRN for insomnia -hydroxyzine  25 mg TID-PRN for anxiety -- will consider starting naltrexone  if patient tolerates below medications  #(MDD, severe, recurrent without psychotic features, rule out substance induced mood disorder -patient has not had any extended period of sobriety recently but state he was depressed every day when sober in jail for 90 days last years - stop Lexapro  5 mg qdaily, discussed r/b/a for off label use with patient -start Cymbalta  30 mg qdaily for neuropathy, depression, anxiety   #GAD -start Cymbalta  30 mg qdaily for neuropathy, depression, anxiety  -continue gabapentin  100 mg TID for alcohol withdrawal, anxiety  -continue trazodone  50 mg at bedtime-PRN for insomnia -hydroxyzine  25 mg TID-PRN for anxiety   Recommendations  Based on my evaluation the patient does not appear to have an emergency medical condition.  Lorretta Kerce, MD 09/22/23  1:19 PM

## 2023-09-22 NOTE — ED Notes (Signed)
 Pt is in the shower

## 2023-09-22 NOTE — ED Notes (Signed)
 Approximately 0945: Pt arrived to [facility] bed 153 from obs.  Pt here for ETOH detox.  Calm and subdued.  Denied current SI plan and intent.   Pt reports loss of interest in activities he used to enjoy over past few weeks.  He states he just wants to feel better and not drink anymore. Q 15 minute observations for safety in place

## 2023-09-22 NOTE — ED Notes (Signed)
Patient attending AA meeting. 

## 2023-09-23 DIAGNOSIS — F32A Depression, unspecified: Secondary | ICD-10-CM | POA: Diagnosis not present

## 2023-09-23 DIAGNOSIS — F1024 Alcohol dependence with alcohol-induced mood disorder: Secondary | ICD-10-CM | POA: Diagnosis not present

## 2023-09-23 DIAGNOSIS — F1420 Cocaine dependence, uncomplicated: Secondary | ICD-10-CM | POA: Diagnosis not present

## 2023-09-23 DIAGNOSIS — F10230 Alcohol dependence with withdrawal, uncomplicated: Secondary | ICD-10-CM | POA: Diagnosis not present

## 2023-09-23 MED ORDER — ACETAMINOPHEN 325 MG PO TABS
650.0000 mg | ORAL_TABLET | Freq: Four times a day (QID) | ORAL | Status: DC
  Administered 2023-09-23 – 2023-09-27 (×12): 650 mg via ORAL
  Filled 2023-09-23 (×12): qty 2

## 2023-09-23 MED ORDER — GABAPENTIN 100 MG PO CAPS
200.0000 mg | ORAL_CAPSULE | Freq: Three times a day (TID) | ORAL | Status: DC
  Administered 2023-09-23 – 2023-09-24 (×3): 200 mg via ORAL
  Filled 2023-09-23 (×3): qty 2

## 2023-09-23 MED ORDER — NALTREXONE HCL 50 MG PO TABS
50.0000 mg | ORAL_TABLET | Freq: Every day | ORAL | Status: DC
  Administered 2023-09-23 – 2023-09-27 (×5): 50 mg via ORAL
  Filled 2023-09-23 (×5): qty 1

## 2023-09-23 MED ORDER — ACETAMINOPHEN 325 MG PO TABS: 650.0000 mg | ORAL_TABLET | Freq: Four times a day (QID) | ORAL | Status: DC

## 2023-09-23 NOTE — ED Provider Notes (Signed)
 Behavioral Health Progress Note  Date and Time: 09/23/2023 6:22 PM Name: Jake Silva MRN:  [id number]  Subjective:  Patient reports improving mood, sleep, and appetite. Reports feeling calmer and denies significant depression or anxiety today. Patient denies significant withdrawal. Reports chronic pelvic pain and asking to try scheduled Tylenol  along with gabapentin  increase. Denies SI, HI or AVH. Patient states They medications you started are helping.   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  Additional Social History:                         Sleep: Fair  Appetite:  Fair  Current Medications:  Current Facility-Administered Medications  Medication Dose Route Frequency Provider Last Rate Last Admin   acetaminophen  (TYLENOL ) tablet 650 mg  650 mg Oral Q6H An Schnabel, MD   650 mg at 09/23/23 1726   alum & mag hydroxide-simeth (MAALOX/MYLANTA) 200-200-20 MG/5ML suspension 30 mL  30 mL Oral Q4H PRN Rollene Katz, MD       chlordiazePOXIDE  (LIBRIUM ) capsule 25 mg  25 mg Oral Letha Rollene Katz, MD       Followed by   NOREEN ON 09/24/2023] chlordiazePOXIDE  (LIBRIUM ) capsule 25 mg  25 mg Oral Daily Rollene Katz, MD       haloperidol  (HALDOL ) tablet 5 mg  5 mg Oral TID PRN Rollene Katz, MD       And   diphenhydrAMINE  (BENADRYL ) capsule 50 mg  50 mg  Oral TID PRN Rollene Katz, MD       haloperidol  lactate (HALDOL ) injection 5 mg  5 mg Intramuscular TID PRN Rollene Katz, MD       And   diphenhydrAMINE  (BENADRYL ) injection 50 mg  50 mg Intramuscular TID PRN Rollene Katz, MD       And   LORazepam  (ATIVAN ) injection 2 mg  2 mg Intramuscular TID PRN Rollene Katz, MD       haloperidol  lactate (HALDOL ) injection 10 mg  10 mg Intramuscular TID PRN Rollene Katz, MD       And   diphenhydrAMINE  (BENADRYL ) injection 50 mg  50 mg Intramuscular TID PRN Rollene Katz, MD       And   LORazepam  (ATIVAN ) injection 2 mg  2 mg Intramuscular TID PRN Rollene Katz, MD       DULoxetine  (CYMBALTA ) DR capsule 30 mg  30 mg Oral Daily Divonte Senger, MD   30 mg at 09/23/23 9141   gabapentin  (NEURONTIN ) capsule 200 mg  200 mg Oral TID Shaira Sova, MD   200 mg at 09/23/23 1614   hydrOXYzine  (ATARAX ) tablet 25 mg  25 mg Oral TID PRN Rollene Katz, MD       loperamide  (IMODIUM ) capsule 2-4 mg  2-4 mg Oral PRN Rollene Katz, MD       magnesium  hydroxide (MILK OF MAGNESIA) suspension 30 mL  30 mL Oral Daily PRN Rollene Katz, MD  multivitamin with minerals tablet 1 tablet  1 tablet Oral Daily Rollene Katz, MD   1 tablet at 09/23/23 0858   naltrexone  (DEPADE) tablet 50 mg  50 mg Oral Daily Ashawn Rinehart, MD   50 mg at 09/23/23 1245   nicotine  (NICODERM CQ  - dosed in mg/24 hours) patch 14 mg  14 mg Transdermal Q0600 Rollene Katz, MD   14 mg at 09/23/23 9141   ondansetron  (ZOFRAN -ODT) disintegrating tablet 4 mg  4 mg Oral Q6H PRN Rollene Katz, MD       traZODone  (DESYREL ) tablet 50 mg  50 mg Oral QHS PRN Rollene Katz, MD       traZODone  (DESYREL ) tablet 50 mg  50 mg Oral QHS Melia Hopes, MD   50 mg at 09/22/23 2138   Current Outpatient Medications  Medication Sig Dispense Refill   cyclobenzaprine  (FLEXERIL ) 10 MG tablet Take 1 tablet (10 mg total) by mouth 2 (two) times daily as needed  for muscle spasms. 20 tablet 0   ibuprofen  (ADVIL ) 600 MG tablet Take 1 tablet (600 mg total) by mouth every 6 (six) hours as needed. (Patient taking differently: Take 600 mg by mouth every 6 (six) hours as needed (For pain).) 30 tablet 0    Labs  Lab Results:  Admission on 09/21/2023, Discharged on 09/22/2023  Component Date Value Ref Range Status   WBC 09/21/2023 3.8 (L)  4.0 - 10.5 K/uL Final   RBC 09/21/2023 4.30  4.22 - 5.81 MIL/uL Final   Hemoglobin 09/21/2023 13.6  13.0 - 17.0 g/dL Final   HCT 92/91/7974 39.7  39.0 - 52.0 % Final   MCV 09/21/2023 92.3  80.0 - 100.0 fL Final   MCH 09/21/2023 31.6  26.0 - 34.0 pg Final   MCHC 09/21/2023 34.3  30.0 - 36.0 g/dL Final   RDW 92/91/7974 13.2  11.5 - 15.5 % Final   Platelets 09/21/2023 289  150 - 400 K/uL Final   nRBC 09/21/2023 0.0  0.0 - 0.2 % Final   Neutrophils Relative % 09/21/2023 42  % Final   Neutro Abs 09/21/2023 1.6 (L)  1.7 - 7.7 K/uL Final   Lymphocytes Relative 09/21/2023 47  % Final   Lymphs Abs 09/21/2023 1.8  0.7 - 4.0 K/uL Final   Monocytes Relative 09/21/2023 9  % Final   Monocytes Absolute 09/21/2023 0.3  0.1 - 1.0 K/uL Final   Eosinophils Relative 09/21/2023 1  % Final   Eosinophils Absolute 09/21/2023 0.0  0.0 - 0.5 K/uL Final   Basophils Relative 09/21/2023 1  % Final   Basophils Absolute 09/21/2023 0.1  0.0 - 0.1 K/uL Final   Immature Granulocytes 09/21/2023 0  % Final   Abs Immature Granulocytes 09/21/2023 0.01  0.00 - 0.07 K/uL Final   Performed at [facility] Lab, [address] [place], [place] [address]   Sodium 09/21/2023 140  135 - 145 mmol/L Final   Potassium 09/21/2023 4.3  3.5 - 5.1 mmol/L Final   Chloride 09/21/2023 103  98 - 111 mmol/L Final   CO2 09/21/2023 26  22 - 32 mmol/L Final   Glucose, Bld 09/21/2023 96  70 - 99 mg/dL Final   Glucose reference range applies only to samples taken after fasting for at least 8 hours.   BUN 09/21/2023 8  6 - 20 mg/dL Final   Creatinine, Ser 09/21/2023  0.83  0.61 - 1.24 mg/dL Final   Calcium 92/91/7974 9.2  8.9 - 10.3 mg/dL Final   Total Protein  09/21/2023 7.7  6.5 - 8.1 g/dL Final   Albumin 92/91/7974 4.3  3.5 - 5.0 g/dL Final   AST 92/91/7974 37  15 - 41 U/L Final   ALT 09/21/2023 26  0 - 44 U/L Final   Alkaline Phosphatase 09/21/2023 63  38 - 126 U/L Final   Total Bilirubin 09/21/2023 0.4  0.0 - 1.2 mg/dL Final   GFR, Estimated 09/21/2023 >60  >60 mL/min Final   Comment: (NOTE) Calculated using the CKD-EPI Creatinine Equation (2021)    Anion gap 09/21/2023 11  5 - 15 Final   Performed at [facility] Lab, [address], [place], [place] [address]   Hgb A1c MFr Bld 09/21/2023 5.2  4.8 - 5.6 % Final   Comment: (NOTE) Diagnosis of Diabetes The following HbA1c ranges recommended by the American Diabetes Association (ADA) may be used as an aid in the diagnosis of diabetes mellitus.  Hemoglobin             Suggested A1C NGSP%              Diagnosis  <5.7                   Non Diabetic  5.7-6.4                Pre-Diabetic  >6.4                   Diabetic  <7.0                   Glycemic control for                       adults with diabetes.     Mean Plasma Glucose 09/21/2023 102.54  mg/dL Final   Performed at [facility] Lab, [address] [place], [place] [address]   Alcohol, Ethyl (B) 09/21/2023 297 (H)  <15 mg/dL Final   Comment: (NOTE) For medical purposes only. Performed at [facility] Lab, [address] [place], [place] [address]    Cholesterol 09/21/2023 240 (H)  0 - 200 mg/dL Final   Triglycerides 92/91/7974 97  <150 mg/dL Final   HDL 92/91/7974 115  >40 mg/dL Final   Total CHOL/HDL Ratio 09/21/2023 2.1  RATIO Final   VLDL 09/21/2023 19  0 - 40 mg/dL Final   LDL Cholesterol 09/21/2023 106 (H)  0 - 99 mg/dL Final   Comment:        Total Cholesterol/HDL:CHD Risk Coronary Heart Disease Risk Table                     Men   Women  1/2 Average Risk   3.4   3.3  Average Risk       5.0   4.4  2 X  Average Risk   9.6   7.1  3 X Average Risk  23.4   11.0        Use the calculated Patient Ratio above and the CHD Risk Table to determine the patient's CHD Risk.        ATP III CLASSIFICATION (LDL):  <100     mg/dL   Optimal  899-870  mg/dL   Near or Above                    Optimal  130-159  mg/dL   Borderline  839-810  mg/dL   High  >809  mg/dL   Very High Performed at [facility] Lab, [address] [place], [place] [address]    TSH 09/21/2023 1.505  0.350 - 4.500 uIU/mL Final   Comment: Performed by a 3rd Generation assay with a functional sensitivity of <=0.01 uIU/mL. Performed at [facility] Lab, [address] [place], [place] [address]    Color, Urine 09/21/2023 YELLOW  YELLOW Final   APPearance 09/21/2023 CLEAR  CLEAR Final   Specific Gravity, Urine 09/21/2023 1.010  1.005 - 1.030 Final   pH 09/21/2023 5.0  5.0 - 8.0 Final   Glucose, UA 09/21/2023 NEGATIVE  NEGATIVE mg/dL Final   Hgb urine dipstick 09/21/2023 NEGATIVE  NEGATIVE Final   Bilirubin Urine 09/21/2023 NEGATIVE  NEGATIVE Final   Ketones, ur 09/21/2023 NEGATIVE  NEGATIVE mg/dL Final   Protein, ur 92/91/7974 NEGATIVE  NEGATIVE mg/dL Final   Nitrite 92/91/7974 NEGATIVE  NEGATIVE Final   Leukocytes,Ua 09/21/2023 NEGATIVE  NEGATIVE Final   Performed at [facility] Lab, [address] [place], [place] [address]   POC Amphetamine UR 09/21/2023 None Detected  NONE DETECTED (Cut Off Level 1000 ng/mL) Final   POC Secobarbital (BAR) 09/21/2023 None Detected  NONE DETECTED (Cut Off Level 300 ng/mL) Final   POC Buprenorphine (BUP) 09/21/2023 None Detected  NONE DETECTED (Cut Off Level 10 ng/mL) Final   POC Oxazepam (BZO) 09/21/2023 None Detected  NONE DETECTED (Cut Off Level 300 ng/mL) Final   POC Cocaine UR 09/21/2023 Positive (A)  NONE DETECTED (Cut Off Level 300 ng/mL) Final   POC Methamphetamine UR 09/21/2023 None Detected  NONE DETECTED (Cut Off Level 1000 ng/mL) Final   POC Morphine  09/21/2023 None  Detected  NONE DETECTED (Cut Off Level 300 ng/mL) Final   POC Methadone UR 09/21/2023 None Detected  NONE DETECTED (Cut Off Level 300 ng/mL) Final   POC Oxycodone  UR 09/21/2023 None Detected  NONE DETECTED (Cut Off Level 100 ng/mL) Final   POC Marijuana UR 09/21/2023 None Detected  NONE DETECTED (Cut Off Level 50 ng/mL) Final   Vit D, 25-Hydroxy 09/21/2023 14.75 (L)  30 - 100 ng/mL Final   Comment: (NOTE) Vitamin D  deficiency has been defined by the Institute of Medicine  and an Endocrine Society practice guideline as a level of serum 25-OH  vitamin D  less than 20 ng/mL (1,2). The Endocrine Society went on to  further define vitamin D  insufficiency as a level between 21 and 29  ng/mL (2).  1. IOM (Institute of Medicine). 2010. Dietary reference intakes for  calcium and D. [place]: [employer]. 2. Holick MF, Binkley [place], Bischoff-Ferrari HA, et al. Evaluation,  treatment, and prevention of vitamin D  deficiency: an Endocrine  Society clinical practice guideline, JCEM. 2011 Jul; 96(7): 1911-30.  Performed at [facility] Lab, [address] [place], [place] [address]    Vitamin B-12 09/21/2023 290  180 - 914 pg/mL Final   Comment: (NOTE) This assay is not validated for testing neonatal or myeloproliferative syndrome specimens for Vitamin B12 levels. Performed at [facility] Lab, [address], [place], [place] [address]   Admission on 09/11/2023, Discharged on 09/11/2023  Component Date Value Ref Range Status   WBC 09/11/2023 5.1  4.0 - 10.5 K/uL Final   RBC 09/11/2023 3.88 (L)  4.22 - 5.81 MIL/uL Final   Hemoglobin 09/11/2023 12.5 (L)  13.0 - 17.0 g/dL Final   HCT 93/71/7974 36.2 (L)  39.0 - 52.0 % Final   MCV 09/11/2023 93.3  80.0 - 100.0 fL Final   MCH 09/11/2023 32.2  26.0 - 34.0 pg Final   MCHC 09/11/2023 34.5  30.0 - 36.0 g/dL Final   RDW 93/71/7974 13.0  11.5 - 15.5 % Final   Platelets 09/11/2023 229  150 - 400 K/uL Final   nRBC 09/11/2023 0.0   0.0 - 0.2 % Final   Performed at [facility] Lab, [address] [place], [place] [address]   Color, Urine 09/11/2023 YELLOW  YELLOW Final   APPearance 09/11/2023 CLEAR  CLEAR Final   Specific Gravity, Urine 09/11/2023 1.029  1.005 - 1.030 Final   pH 09/11/2023 5.0  5.0 - 8.0 Final   Glucose, UA 09/11/2023 NEGATIVE  NEGATIVE mg/dL Final   Hgb urine dipstick 09/11/2023 NEGATIVE  NEGATIVE Final   Bilirubin Urine 09/11/2023 NEGATIVE  NEGATIVE Final   Ketones, ur 09/11/2023 NEGATIVE  NEGATIVE mg/dL Final   Protein, ur 93/71/7974 NEGATIVE  NEGATIVE mg/dL Final   Nitrite 93/71/7974 NEGATIVE  NEGATIVE Final   Leukocytes,Ua 09/11/2023 NEGATIVE  NEGATIVE Final   Performed at [facility] Lab, [address], [place], [place] [address]   Sodium 09/11/2023 139  135 - 145 mmol/L Final   Potassium 09/11/2023 3.8  3.5 - 5.1 mmol/L Final   Chloride 09/11/2023 105  98 - 111 mmol/L Final   CO2 09/11/2023 20 (L)  22 - 32 mmol/L Final   Glucose, Bld 09/11/2023 92  70 - 99 mg/dL Final   Glucose reference range applies only to samples taken after fasting for at least 8 hours.   BUN 09/11/2023 9  6 - 20 mg/dL Final   Creatinine, Ser 09/11/2023 0.87  0.61 - 1.24 mg/dL Final   Calcium 93/71/7974 9.1  8.9 - 10.3 mg/dL Final   Total Protein 93/71/7974 6.8  6.5 - 8.1 g/dL Final   Albumin 93/71/7974 3.8  3.5 - 5.0 g/dL Final   AST 93/71/7974 37  15 - 41 U/L Final   ALT 09/11/2023 21  0 - 44 U/L Final   Alkaline Phosphatase 09/11/2023 52  38 - 126 U/L Final   Total Bilirubin 09/11/2023 0.3  0.0 - 1.2 mg/dL Final   GFR, Estimated 09/11/2023 >60  >60 mL/min Final   Comment: (NOTE) Calculated using the CKD-EPI Creatinine Equation (2021)    Anion gap 09/11/2023 14  5 - 15 Final   Performed at [facility] Lab, [address], [place], [place] [address]  Admission on 08/27/2023, Discharged on 08/27/2023  Component Date Value Ref Range Status   Specimen Source 08/27/2023 URINE, CLEAN CATCH   Final    Color, Urine 08/27/2023 COLORLESS (A)  YELLOW Final   APPearance 08/27/2023 CLEAR  CLEAR Final   Specific Gravity, Urine 08/27/2023 1.002 (L)  1.005 - 1.030 Final   pH 08/27/2023 5.0  5.0 - 8.0 Final   Glucose, UA 08/27/2023 NEGATIVE  NEGATIVE mg/dL Final   Hgb urine dipstick 08/27/2023 NEGATIVE  NEGATIVE Final   Bilirubin Urine 08/27/2023 NEGATIVE  NEGATIVE Final   Ketones, ur 08/27/2023 NEGATIVE  NEGATIVE mg/dL Final   Protein, ur 93/86/7974 NEGATIVE  NEGATIVE mg/dL Final   Nitrite 93/86/7974 NEGATIVE  NEGATIVE Final   Leukocytes,Ua 08/27/2023 NEGATIVE  NEGATIVE Final   RBC / HPF 08/27/2023 0-5  0 - 5 RBC/hpf Final   WBC, UA 08/27/2023 0-5  0 - 5 WBC/hpf Final   Comment:        Reflex urine culture not performed if WBC <=10, OR if Squamous epithelial cells >  5. If Squamous epithelial cells >5 suggest recollection.    Bacteria, UA 08/27/2023 NONE SEEN  NONE SEEN Final   Squamous Epithelial / HPF 08/27/2023 0-5  0 - 5 /HPF Final   Performed at [facility], [address] [place], [place] [address]   Neisseria Gonorrhea 08/27/2023 Negative   Final   Chlamydia 08/27/2023 Negative   Final   Comment 08/27/2023 Normal Reference Ranger Chlamydia - Negative   Final   Comment 08/27/2023 Normal Reference Range Neisseria Gonorrhea - Negative   Final  Admission on 08/11/2023, Discharged on 08/11/2023  Component Date Value Ref Range Status   Sodium 08/11/2023 139  135 - 145 mmol/L Final   Potassium 08/11/2023 4.2  3.5 - 5.1 mmol/L Final   Chloride 08/11/2023 107  98 - 111 mmol/L Final   CO2 08/11/2023 21 (L)  22 - 32 mmol/L Final   Glucose, Bld 08/11/2023 88  70 - 99 mg/dL Final   Glucose reference range applies only to samples taken after fasting for at least 8 hours.   BUN 08/11/2023 9  6 - 20 mg/dL Final   Creatinine, Ser 08/11/2023 0.84  0.61 - 1.24 mg/dL Final   Calcium 94/71/7974 8.7 (L)  8.9 - 10.3 mg/dL Final   Total Protein 94/71/7974 7.0  6.5 - 8.1 g/dL Final   Albumin  94/71/7974 3.9  3.5 - 5.0 g/dL Final   AST 94/71/7974 73 (H)  15 - 41 U/L Final   ALT 08/11/2023 41  0 - 44 U/L Final   Alkaline Phosphatase 08/11/2023 73  38 - 126 U/L Final   Total Bilirubin 08/11/2023 0.3  0.0 - 1.2 mg/dL Final   GFR, Estimated 08/11/2023 >60  >60 mL/min Final   Comment: (NOTE) Calculated using the CKD-EPI Creatinine Equation (2021)    Anion gap 08/11/2023 11  5 - 15 Final   Performed at [facility] Lab, [address] [place], [place] [address]   Alcohol, Ethyl (B) 08/11/2023 344 (HH)  <15 mg/dL Final   Comment: CRITICAL RESULT CALLED TO, READ BACK BY AND VERIFIED WITH KRISTA CHAPLIN,RN AT 1021 08/11/2023 BY ZBEECH. (NOTE) For medical purposes only. Performed at [facility], [address], [place], [place] [address]    Opiates 08/11/2023 NONE DETECTED  NONE DETECTED Final   Cocaine 08/11/2023 NONE DETECTED  NONE DETECTED Final   Benzodiazepines 08/11/2023 NONE DETECTED  NONE DETECTED Final   Amphetamines 08/11/2023 NONE DETECTED  NONE DETECTED Final   Tetrahydrocannabinol 08/11/2023 NONE DETECTED  NONE DETECTED Final   Barbiturates 08/11/2023 NONE DETECTED  NONE DETECTED Final   Comment: (NOTE) DRUG SCREEN FOR MEDICAL PURPOSES ONLY.  IF CONFIRMATION IS NEEDED FOR ANY PURPOSE, NOTIFY LAB WITHIN 5 DAYS.  LOWEST DETECTABLE LIMITS FOR URINE DRUG SCREEN Drug Class                     Cutoff (ng/mL) Amphetamine and metabolites    1000 Barbiturate and metabolites    200 Benzodiazepine                 200 Opiates and metabolites        300 Cocaine and metabolites        300 THC                            50 Performed at [facility] Lab, [address] [place], [place] [address]  WBC 08/11/2023 4.8  4.0 - 10.5 K/uL Final   RBC 08/11/2023 4.18 (L)  4.22 - 5.81 MIL/uL Final   Hemoglobin 08/11/2023 13.2  13.0 - 17.0 g/dL Final   HCT 94/71/7974 40.0  39.0 - 52.0 % Final   MCV 08/11/2023 95.7  80.0 - 100.0 fL Final   MCH 08/11/2023 31.6  26.0 -  34.0 pg Final   MCHC 08/11/2023 33.0  30.0 - 36.0 g/dL Final   RDW 94/71/7974 13.4  11.5 - 15.5 % Final   Platelets 08/11/2023 292  150 - 400 K/uL Final   nRBC 08/11/2023 0.0  0.0 - 0.2 % Final   Neutrophils Relative % 08/11/2023 41  % Final   Neutro Abs 08/11/2023 2.0  1.7 - 7.7 K/uL Final   Lymphocytes Relative 08/11/2023 50  % Final   Lymphs Abs 08/11/2023 2.4  0.7 - 4.0 K/uL Final   Monocytes Relative 08/11/2023 7  % Final   Monocytes Absolute 08/11/2023 0.3  0.1 - 1.0 K/uL Final   Eosinophils Relative 08/11/2023 1  % Final   Eosinophils Absolute 08/11/2023 0.0  0.0 - 0.5 K/uL Final   Basophils Relative 08/11/2023 1  % Final   Basophils Absolute 08/11/2023 0.1  0.0 - 0.1 K/uL Final   Immature Granulocytes 08/11/2023 0  % Final   Abs Immature Granulocytes 08/11/2023 0.01  0.00 - 0.07 K/uL Final   Performed at [facility] Lab, [address] [place], [place] [address]  Admission on 07/21/2023, Discharged on 07/21/2023  Component Date Value Ref Range Status   Lipase 07/21/2023 46  11 - 51 U/L Final   Performed at [facility] Lab, [address] [place], [place] [address]   Sodium 07/21/2023 131 (L)  135 - 145 mmol/L Final   Potassium 07/21/2023 4.7  3.5 - 5.1 mmol/L Final   HEMOLYSIS AT THIS LEVEL MAY AFFECT RESULT   Chloride 07/21/2023 98  98 - 111 mmol/L Final   CO2 07/21/2023 19 (L)  22 - 32 mmol/L Final   Glucose, Bld 07/21/2023 92  70 - 99 mg/dL Final   Glucose reference range applies only to samples taken after fasting for at least 8 hours.   BUN 07/21/2023 9  6 - 20 mg/dL Final   Creatinine, Ser 07/21/2023 0.90  0.61 - 1.24 mg/dL Final   Calcium 94/92/7974 9.1  8.9 - 10.3 mg/dL Final   Total Protein 94/92/7974 7.3  6.5 - 8.1 g/dL Final   Albumin 94/92/7974 4.0  3.5 - 5.0 g/dL Final   AST 94/92/7974 54 (H)  15 - 41 U/L Final   HEMOLYSIS AT THIS LEVEL MAY AFFECT RESULT   ALT 07/21/2023 33  0 - 44 U/L Final   HEMOLYSIS AT THIS LEVEL MAY AFFECT RESULT   Alkaline  Phosphatase 07/21/2023 76  38 - 126 U/L Final   Total Bilirubin 07/21/2023 1.4 (H)  0.0 - 1.2 mg/dL Final   HEMOLYSIS AT THIS LEVEL MAY AFFECT RESULT   GFR, Estimated 07/21/2023 >60  >60 mL/min Final   Comment: (NOTE) Calculated using the CKD-EPI Creatinine Equation (2021)    Anion gap 07/21/2023 14  5 - 15 Final   Performed at [facility] Lab, [address], [place], [place] [address]   WBC 07/21/2023 4.3  4.0 - 10.5 K/uL Final   RBC 07/21/2023 4.41  4.22 - 5.81 MIL/uL Final   Hemoglobin 07/21/2023 14.0  13.0 - 17.0 g/dL Final   HCT 94/92/7974 41.9  39.0 - 52.0 %  Final   MCV 07/21/2023 95.0  80.0 - 100.0 fL Final   MCH 07/21/2023 31.7  26.0 - 34.0 pg Final   MCHC 07/21/2023 33.4  30.0 - 36.0 g/dL Final   RDW 94/92/7974 12.7  11.5 - 15.5 % Final   Platelets 07/21/2023 234  150 - 400 K/uL Final   nRBC 07/21/2023 0.0  0.0 - 0.2 % Final   Performed at [facility] Lab, [address] [place], [place] [address]    Blood Alcohol level:  Lab Results  Component Value Date   ETH 297 (H) 09/21/2023   ETH 344 (HH) 08/11/2023    Metabolic Disorder Labs: Lab Results  Component Value Date   HGBA1C 5.2 09/21/2023   MPG 102.54 09/21/2023   No results found for: PROLACTIN Lab Results  Component Value Date   CHOL 240 (H) 09/21/2023   TRIG 97 09/21/2023   HDL 115 09/21/2023   CHOLHDL 2.1 09/21/2023   VLDL 19 09/21/2023   LDLCALC 106 (H) 09/21/2023    Therapeutic Lab Levels: No results found for: LITHIUM No results found for: VALPROATE No results found for: CBMZ  Physical Findings   CAGE-AID    Flowsheet Row ED to Hosp-Admission (Discharged) from 08/11/2020 in [facility]  CAGE-AID Score 0   Flowsheet Row ED from 09/22/2023 in [facility] ED from 09/21/2023 in [facility] ED from 09/13/2023 in [facility] Emergency Department at [facility]  C-SSRS RISK CATEGORY No  Risk No Risk No Risk     Musculoskeletal  Strength & Muscle Tone: within normal limits Gait & Station: normal Patient leans: N/A  Psychiatric Specialty Exam  Presentation  General Appearance:  Appropriate for Environment  Eye Contact: Fair  Speech: Clear and Coherent  Speech Volume: Normal  Handedness:No data recorded  Mood and Affect  Mood: Depressed  Affect: Appropriate   Thought Process  Thought Processes: Goal Directed  Descriptions of Associations:Intact  Orientation:Full (Time, Place and Person)  Thought Content:Logical  Diagnosis of Schizophrenia or Schizoaffective disorder in past: No    Hallucinations:Hallucinations: None  Ideas of Reference:None  Suicidal Thoughts:Suicidal Thoughts: No  Homicidal Thoughts:Homicidal Thoughts: No   Sensorium  Memory: Immediate Fair  Judgment: Fair  Insight: Fair   Art therapist  Concentration: Fair  Attention Span: Fair  Recall: [employer] of Knowledge: Fair  Language: Fair   Psychomotor Activity  Psychomotor Activity: Psychomotor Activity: Normal   Assets  Assets: Communication Skills; Desire for Improvement   Sleep  Sleep: Sleep: Fair  Estimated Sleeping Duration (Last 24 Hours): 16.00-17.75 hours  No data recorded  Physical Exam     Physical exam:  General: Well developed, well nourished, [place] male Pupils: Normal at 3mm Respiratory: Breathing is unlabored.  Cardiovascular: No edema.  Language: No anomia, no aphasia Muscle strength and tone-pt moving all extremities.  Gait not assessed as pt remained in bed.  Neuro: Facial muscles are symmetric. Pt without tremor, no evidence of hyperarousal.    Review of Systems  Constitutional:  Positive for fatigue.  HENT: Negative.    Eyes: Negative.   Respiratory: Negative.    Cardiovascular: Negative.   Gastrointestinal: Negative.   Genitourinary: Negative.   Musculoskeletal:  Positive for joint  pain.  Skin: Negative.   Neurological: Negative.   Endo/Heme/Allergies: Negative.   Psychiatric/Behavioral:  Positive for depression. Negative for hallucinations and paranoia. The patient is nervous/anxious and has insomnia.   Blood pressure  116/85, pulse 70, temperature 97.8 F (36.6 C), temperature source Oral, resp. rate 17, SpO2 100%. There is no height or weight on file to calculate BMI.  Treatment Plan Summary: Daily contact with patient to assess and evaluate symptoms and progress in treatment and Medication management  43 y.o. male with a history of cocaine abuse, alcohol abuse, and depression, anxiety who was brought to  [facility] by Patent examiner. No information was provided by law enforcement prior to them leaving the facility. Patient denies history of psychiatric admissions or suicide attempts. Denies SI or HI today but presented to detox from alcohol and cocaine and start a medication for depression, anxiety and chronic pain. Symptoms most consistent with MDD and GAD based on patient history however cannot rule out substance induced mood disorder. Discussed r/b/a of starting Cymbalta  and gabapentin  and patient is agreeable.   7/10: reports improving mood, sleep and appetite. Denies SI, HI or AVH. Denies side effects from medications. Withdrawls are well controlled with CIWA <5. Discussed r/b/a of starting naltrexone  50 mg qdaily and patient would like to try.   #alcohol use disorder, severe with withdrawal -continue Librium  taper + CIWA protocol with PRN Librium  -increase gabapentin  to 200 mg TID for alcohol withdrawal, anxiety, and pain -continue trazodone  50 mg at bedtime-PRN for insomnia -hydroxyzine  25 mg TID-PRN for anxiety -- start naltrexone  50 mg qdaily   #(MDD, severe, recurrent without psychotic features, rule out substance induced mood disorder -patient has not had any extended period of sobriety recently but state he was depressed  every day when sober in jail for 90 days last years - stopped Lexapro  5 mg qdaily, discussed r/b/a for off label use with patient -cont Cymbalta  30 mg qdaily for neuropathy, depression, anxiety    #GAD -cont Cymbalta  30 mg qdaily for neuropathy, depression, anxiety  -continue trazodone  50 mg at bedtime-PRN for insomnia -hydroxyzine  25 mg TID-PRN for anxiety    Shaolin Armas, MD 09/23/2023 6:22 PM

## 2023-09-23 NOTE — Group Note (Signed)
 Group Topic: Positive Affirmations  Group Date: 09/22/2023 Start Time: 2000 End Time: 2110 Facilitators: Lenon Lenice SAUNDERS, NT  Department: [facility]  Number of Participants: 7  Group Focus: affirmation Treatment Modality:  Individual Therapy Interventions utilized were support Purpose: express feelings  Name: Jake Silva Date of Birth: October 05, 1980  MR: [id number]    Level of Participation: pt did not attend group Quality of Participation: pt did not attend group Interactions with others: pt did not attend group Mood/Affect: pt did not attend group Triggers (if applicable):  Cognition: pt did not attend group Progress: pt did not attend group Response: pt did not attend group Plan: pt did not attend group  Patients Problems:  Patient Active Problem List   Diagnosis Date Noted   Alcohol use disorder, severe, dependence (HCC) 09/22/2023   Critical polytrauma 08/21/2020   Trauma 08/11/2020   Alcohol abuse 01/23/2014

## 2023-09-23 NOTE — ED Notes (Signed)
 Patient is awake and showering at this time.  No distress.  No withdrawal.  Makes needs known to staff.  Will monitor.

## 2023-09-23 NOTE — Discharge Planning (Signed)
 SW spoke with patient and he stated he was unsure if he wanted to do a residential program any further and was waiting to hear back from his sister to see if he could stay with her. He is scheduled for [facility] and is stating he wants to just pursue outpatient IOP but for SW to let him know if he was accepted to any residential that SW initially sent out to.  Patient stated that he should hear back from his sister when she gets off work at [employer] and let this SW know.  We discussed options for [facility] and patient stated this is something he may pursue in the future.   SW received call back from [place] at 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 SW explained that patient was not assessed to have a cognitive impairment by this SW, though indicated in the record on admission. SW will make patient aware.  SW made call to [facility] and left message with Joen regarding patients referral. Will continue to follow.

## 2023-09-23 NOTE — Discharge Planning (Signed)
 SW spoke with Joen at [facility] who reviewed patients clinicals and just needs him to call in for pre screen and will let SW know. She will have a bed available on Wednesday. Will continue to follow.
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
## 2023-09-23 NOTE — ED Notes (Signed)
Pt is sleeping, no acute distress noted.

## 2023-09-23 NOTE — Group Note (Signed)
 Group Topic: Change and Accountability  Group Date: 09/23/2023 Start Time: 2100 End Time: 2130 Facilitators: Joan Plowman B  Department: [facility]  Number of Participants: 5  Group Focus: abuse issues, check in, chemical dependency education, chemical dependency issues, clarity of thought, communication, community group, coping skills, daily focus, depression, and goals/reality orientation Treatment Modality:  Individual Therapy and Leisure Development Interventions utilized were leisure development and support Purpose: express feelings  Name: Jake Silva Date of Birth: 16-Nov-1980  MR: [id number]    Level of Participation: PT did not go to groups.  Quality of Participation: cooperative Interactions with others: gave feedback Mood/Affect: appropriate Triggers (if applicable): NA Cognition: coherent/clear Progress: None Response: NA Plan: patient will be encouraged to go to groups.   Patients Problems:  Patient Active Problem List   Diagnosis Date Noted   Alcohol use disorder, severe, dependence (HCC) 09/22/2023   Critical polytrauma 08/21/2020   Trauma 08/11/2020   Alcohol abuse 01/23/2014

## 2023-09-23 NOTE — Group Note (Signed)
 Group Topic: Social Support  Group Date: 09/23/2023 Start Time: 1705 End Time: 1715 Facilitators: Celinda Suzen NOVAK, NT  Department: [facility]  Number of Participants: 10  Group Focus: relapse prevention and self-esteem Treatment Modality:  Psychoeducation Interventions utilized were support Purpose: increase insight and regain self-worth  Name: ZAHI PLASKETT Date of Birth: January 12, 1981  MR: [id number]    Level of Participation: minimal Quality of Participation: cooperative Interactions with others: gave feedback Mood/Affect: appropriate Triggers (if applicable): n/a Cognition: coherent/clear Progress: Other Response: PT mentioned he was ready to leave already  Plan: patient will be encouraged to attend group  Patients Problems:  Patient Active Problem List   Diagnosis Date Noted   Alcohol use disorder, severe, dependence (HCC) 09/22/2023   Critical polytrauma 08/21/2020   Trauma 08/11/2020   Alcohol abuse 01/23/2014

## 2023-09-23 NOTE — Group Note (Signed)
 Group Topic: Healthy Self Image and Positive Change  Group Date: 09/23/2023 Start Time: 1245 End Time: 1315 Facilitators: Stanly Stabile, RN  Department: [facility]  Number of Participants: 10  Group Focus: affirmation, coping skills, problem solving, and self-esteem Treatment Modality:  Behavior Modification Therapy Interventions utilized were assignment, clarification, exploration, group exercise, patient education, problem solving, and support Purpose: enhance coping skills, explore maladaptive thinking, express irrational fears, increase insight, regain self-worth, reinforce self-care, and relapse prevention strategies  Name: Jake Silva Date of Birth: [birth year]  MR: [id number]    Level of Participation: moderate Quality of Participation: attentive and cooperative Interactions with others: gave feedback Mood/Affect: appropriate Triggers (if applicable):   Cognition: coherent/clear and goal directed Progress: Moderate Response:   Plan: follow-up needed  Patients Problems:  Patient Active Problem List   Diagnosis Date Noted   Alcohol use disorder, severe, dependence (HCC) 09/22/2023   Critical polytrauma 08/21/2020   Trauma 08/11/2020   Alcohol abuse 01/23/2014

## 2023-09-23 NOTE — Group Note (Signed)
 Group Topic: Recovery Basics  Group Date: 09/23/2023 Start Time: 1215 End Time: 1245 Facilitators: Carletha Iha, RN  Department: [facility]  Number of Participants: 10  Group Focus: nursing group Treatment Modality:  Psychoeducation Interventions utilized were patient education Purpose: increase insight  Name: WIL SLAPE Date of Birth: [birth year]  MR: [id number]    Level of Participation: active Quality of Participation: attentive Interactions with others: gave feedback Mood/Affect: appropriate Triggers (if applicable):  Cognition: coherent/clear Progress: Gaining insight Response:  Plan: patient will be encouraged to identify triggers and create plan for sobriety   Patients Problems:  Patient Active Problem List   Diagnosis Date Noted   Alcohol use disorder, severe, dependence (HCC) 09/22/2023   Critical polytrauma 08/21/2020   Trauma 08/11/2020   Alcohol abuse 01/23/2014

## 2023-09-24 DIAGNOSIS — F1420 Cocaine dependence, uncomplicated: Secondary | ICD-10-CM | POA: Diagnosis not present

## 2023-09-24 DIAGNOSIS — F1024 Alcohol dependence with alcohol-induced mood disorder: Secondary | ICD-10-CM | POA: Diagnosis not present

## 2023-09-24 DIAGNOSIS — F32A Depression, unspecified: Secondary | ICD-10-CM | POA: Diagnosis not present

## 2023-09-24 DIAGNOSIS — F10230 Alcohol dependence with withdrawal, uncomplicated: Secondary | ICD-10-CM | POA: Diagnosis not present

## 2023-09-24 MED ORDER — DULOXETINE HCL 60 MG PO CPEP
60.0000 mg | ORAL_CAPSULE | Freq: Every day | ORAL | Status: DC
  Administered 2023-09-25 – 2023-09-27 (×3): 60 mg via ORAL
  Filled 2023-09-24 (×3): qty 1

## 2023-09-24 MED ORDER — GABAPENTIN 300 MG PO CAPS
300.0000 mg | ORAL_CAPSULE | Freq: Three times a day (TID) | ORAL | Status: DC
  Administered 2023-09-24 – 2023-09-25 (×3): 300 mg via ORAL
  Filled 2023-09-24 (×3): qty 1

## 2023-09-24 NOTE — BHH Group Notes (Signed)
 SPIRITUALITY GROUP NOTE  Spirituality group facilitated by Chaplain Niaya Hickok, MDiv, BCC.  Group Description: Group focused on topic of hope. Patients participated in facilitated discussion around topic, connecting with one another around experiences and definitions for hope. Group members engaged with visual explorer photos, reflecting on what hope looks like for them today. Group engaged in discussion around how their definitions of hope are present today in hospital.  Modalities: Psycho-social ed, Adlerian, Narrative, MI  Patient Progress:  Present throughout group.  Engaged in group topic with appropriate responses.

## 2023-09-24 NOTE — ED Notes (Signed)
 Patient remains asleep in bed without issue or complaint at this time.  Patient calm and pleasant.  No withdrawal.

## 2023-09-24 NOTE — Discharge Instructions (Addendum)
 You are following up with arranged appointment for SAIOP assessment with [facility] on Monday 7/14 @ 1:00.   Base on the information you have provided and the presenting issue, outpatient services and resources have been recommended.  It is imperative that you follow through with treatment recommendations within 5-7 days from the of discharge to mitigate further risk to your safety and mental well-being. A list of referrals has been provided below to get you started.  You are not limited to the list provided.  In case of an urgent crisis, you may contact the Mobile Crisis Unit with [facility] at [phone].         Inpatient and Outpatient substance abuse resources   Residential Treatment  Facilities Medicaid Detox No Insurance Private Insurance   [facility]) [address] [place], [place] [phone] or [phone]   No  Yes  Yes  Yes   [facility] [phone] [place], [place] [address] [phone] Admissions: 8am-3pm  M-F   [place] only  No  Yes  No    [facility] [phone]   No  Yes  No- out of pocket 16,000  Yes   [facility] ([facility]) [address] [place], [place] [phone]   Yes- No medicare  Yes   Yes, [place]   [address] only    [address] [place], [place], [address] [phone]   No  No  Yes but private pay, offers some sponsorships  Does not take insurance    [facility] [place], [place] [phone]       No  No  Yes- [place] and [place] out of pocket if not in those counties. 3,220.00 for 28 days.   No   Residential Treatment  Facilities   Medicaid  Detox  No IT trainer (multiple locations throughout the country)  Intake: [phone]    No   Yes   No   Yes   [facility]  [facility] [place], [place] [phone] (takes everyone as long as they meet detox criteria)   Yes  Yes   Yes  Yes   [address] [place], [place]  [phone] 27 locations    No  No- sober living house  90.00-130.00 per week per person  [facility] Part of [place] Outreach [phone] [email or web address] [facility] Part of [place] Outreach [phone] [email or web address]    No   [facility] [address]  [place] [phone] [email or web address]     No  No  1,200.00 a 200.00 deposit is required at start of treatment Payment plans accepted Teretha based program  No   Residential Treatment  Facilities   Medicaid  Detox  No [facility] [address]  [place], [place], [address] [phone]  No  Yes       Yes  Regular Rehab: 7,500.00 28 days Dual Diagnosis: 8,900.00 28 days 7 day detox: 2.700.00 or 3,400.00 for Dual Diagnosis   Yes   Outpatient Treatment  Facilities Medicaid Detox No [facility]   [facility] [address]  [place], [place], [address] [phone]   No  No  No  Yes    [facility] and Partial Hospitalization Program  (If substance abuse is secondary diagnosis) [address],  [place], [place] [address] [phone]   Yes-[place] and [place] Only for Partial   No   No   Yes- IOP  [facility]  [address] [place] [place], [place] [phone] (offers adult AND adolescent Intensive Outpatient services) (also in [place], [place], [place] and [place])      No No IOP- does some sponsorships on individual basis Yes   Outpatient Treatment  Facilities Medicaid Detox No [facility] [address]  [place], [place], [address] [phone]   Yes   They would go to ER at [facility] then be transferred to a detox unit    Yes- self pay     Yes    [facility] [address]  [place], [place], [address] And  [address],  [place], [place] [address] [phone]   Yes  No    Yes most qualify for state funding IOP and Opiod treatment- Offers Methadone  UHC, [place], [place]   [facility] [place]  [phone]   No  Yes (in residential treatment program)   No   Insurance Only    [facility] [address] [place], [place],  [phone]   Yes but not for suboxone treatment  Yes- opiates with suboxone have to commit to 8 week IOP   Yes 595.00 for first visit  150.00 for prescription 150.00 a week after that for group    Yes   [facility] [address]  [place], [place] [phone]  No- has a waiting list about to be approved No Yes- but has to be self pay  500.00 for 1st 2 weeks  500 for next 2 weeks and  750 mo. Ongoing Yes   Outpatient Treatment  Facilities Medicaid Detox No Insurance [facility]   [facility] [phone]  [address] [place], [place] [phone]  No No Limited sponsorships IOP- 9,500.00 8-15 weeks If paid upfront gives a 500.00 deduction Outpatient- 1 day a week  9 weeks 4,500.00 has payment plans   Yes- out of network though   [facility] (Groups/Residential) [place], [place]  [phone]  Yes- [place] No IOP facility  Yes  No   [facility]  [address] [place] [place], [place] [phone]  No No Out of pocket only- depends on the situation  Allow people to do services on a flexible  payment plan- billed every 90 days based on income  35.00 per group- 2 hour session  *DUI assessments  and *education for charges  *evaluations   No  Outpatient Treatment  Facilities Medicaid Detox No Insurance Private  Insurance   [facility]  [address]  [place], [place], [address] [phone]   Yes  No  Yes  Yes    Mobile Crisis: [facility]: [phone]  (For crisis response 24 hours a day) [facility] Hotline: [phone]  Homeless Shelters in [facility]) [address] [place], [place] Phone: [phone]   [facility] has been providing emergency shelter to those in need of a permanent residence for over 35 years. The [employer] shelter plays an important role in our community.   There are many life events that can pull someone into a downward spiral towards poverty that is very difficult to get out of.  Homelessness is a problem that can affect anyone of us . [employer] is a safe and comforting place to stay, especially if you have experienced the hardship of street life.   [facility] provides a single bed and bedding to 100 adult men and women. The shelter welcomes all who are in need of housing, no one in real need is turned away unless space is not available.   While staying at [facility], guests are offered more than just a bed for a night. Hot meals are provided and every guest has access to case management services. Case managers provide assistance with finding housing, employment, or other services that will help them gain stability. Continuous stay is based on availability, capacity, and progress towards goals.   To contact the front desk of [facility] please call  [phone] ext [phone] or ext. [phone].      [facility] AND CHILDREN) [address] [place], [place]  [address] [phone]       [facility] [address], [place], [place] [address] Phone: [phone]     [facility]  [address], [place], [place]  [address] [phone] Population served: Male veterans 18+ with  substance abuse/mental health issues Eligibility: By referral only     [facility] (Women only) [address] [place]ora Solon [place], [place] [address] Phone: [phone] Population served: Single women 18+ without dependents Documents required:  Valid ID & Social Security Card       [facility] [employer] [employer]. Address: [address], [place], [place] [address] Hours:   Opens 9?AM Mon Phone: [phone]  The [facility] providers a continuum of housing services to those experiencing homelessness. They provide transitional [employer] and permanent supportive housing ([place] [employer]) to disabled veterans experiencing homelessness. There is a fast track [facility] program couples housing stability services with temporary financial assistance to quickly house individuals and families experiencing homelessness.   [facility] [address], [place] [address] Phone: [phone]      [facility] [address] [place], [place] [address] Phone: [phone]  Offers food and emergency or transitional housing to men, women, or families in need. Clients participate in programs and workshops developed to promote self-sufficiency and personal development. Call or walk in. Applications are accepted Monday, Wednesday, and Friday by appointment only. Need photo ID and proof of income    [facility] [employer] [address] [place], [place] [address] [phone] Population served: Men 18+, preference for disabled and/or veterans    [facility]  [address] [place], [place], [place] [address] [phone] Population served: Families with children.      [facility] at [facility] [place] [facility] [employer]. [address] [place] [address] [phone] or [phone] Population served: Pregnant women with or without children       [employer]  Shelter [address] [place], [place] [address] Check in at 6:00PM for placement at a local shelter) Phone: [phone]   [facility] Eligibility:  Must be drug and alcohol free for at least 14 days or more at the time of application. This program serves males.  Houses Engineer, civil (consulting), Economist who serve six-month terms.  Houses are financially self-supporting; members split house expenses, which average $90.00 to $130.00 per person per week.  Any Resident who relapses must be immediately expelled. Call:  [phone]   [facility] Address: [address] [place], [place] [address]  Phone#: [phone]    [facility] Address: [address] [place], [place] [address] Phone: [phone]  -[facility] provides food, shelter and other programs and services to the homeless men of [place] through our [employer] program.  By offering safe shelter, three meals a day, clean clothing, Biblical counseling, financial planning, vocational training, GED/education and employment assistance, we've helped mend the shattered lives of many homeless men since opening in [place].  We have approximately 267 beds available, with a max of 312 beds including mats for emergency situations and currently house an average of 270 men a night.  Prospective Client Check-In Information Photo ID Required (State/ Out of State/ [facility]) - if photo ID is not available, clients are required to have a printout of a police/sheriff's criminal history report. Help out with chores around the [place]. No sex offender of any type (pending, charged, registered and/or any other sex related offenses) will be permitted to check in. Must be willing to abide by all rules, regulations, and policies established by the [employer]. The following will be provided - shelter, food, clothing, and biblical counseling. If you or someone  you know is in need of assistance at our [facility] shelter in [place], [place], please call [phone] ext. [phone].  Women Shelter for [employer] hours are Monday-Friday only.    [facility]          -(Please Call) Phone: [phone]   Provides housing and specialized case management focused on housing stability.

## 2023-09-24 NOTE — Group Note (Signed)
 Group Topic: Relapse and Recovery  Group Date: 09/24/2023 Start Time: 2000 End Time: 2100 Facilitators: Joan Plowman B  Department: [facility]  Number of Participants: 5  Group Focus: abuse issues, chemical dependency issues, and goals/reality orientation Treatment Modality:  Leisure Development and Psychoeducation Interventions utilized were group exercise, patient education, and support Purpose: enhance coping skills and express feelings  Name: SHEIKH LEVERICH Date of Birth: 08-08-1980  MR: [id number]    Level of Participation: PT DID NOT GO TO GROUPS Quality of Participation: cooperative Interactions with others: gave feedback Mood/Affect: appropriate Triggers (if applicable): NA Cognition: coherent/clear Progress: None Response: NA Plan: patient will be encouraged to go to groups.   Patients Problems:  Patient Active Problem List   Diagnosis Date Noted   Alcohol use disorder, severe, dependence (HCC) 09/22/2023   Critical polytrauma 08/21/2020   Trauma 08/11/2020   Alcohol abuse 01/23/2014
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
## 2023-09-24 NOTE — Discharge Planning (Signed)
 SW spoke with patient regarding disposition for plans and he stated he was not able to reach [place] and left voice mails. He stated that he would like to DC to stay with his sister but would like a bus pas to his case manager's appointment scheduled for SAIOP assessment on Monday at 1:00. Patient has mental health services with [facility]. SW will provide resources discussed printed and on AVS. Will make MD and RN aware of anticipated DC date for Monday 7/21. No further issues noted or reported.

## 2023-09-24 NOTE — ED Notes (Signed)
 MHT just got pts clothes out his locker and put them in the washer. If they're not done ill let first shift know to switch them.

## 2023-09-24 NOTE — Group Note (Signed)
 Group Topic: Recovery Basics  Group Date: 09/24/2023 Start Time: 1000 End Time: 1045 Facilitators: Judi Monico RAMAN, NT  Department: [facility]  Number of Participants: 6  Group Focus: other peer support Treatment Modality:  Psychoeducation Interventions utilized were support Purpose: enhance coping skills, explore maladaptive thinking, express feelings, express irrational fears, improve communication skills, increase insight, regain self-worth, and reinforce self-care  Name: Jake Silva Date of Birth: Dec 27, 1980  MR: [id number]    Level of Participation: active Quality of Participation: attentive Interactions with others: gave feedback Mood/Affect: appropriate Triggers (if applicable): n/a Cognition: coherent/clear Progress: Gaining insight Response: n/a Plan: follow-up needed  Patients Problems:  Patient Active Problem List   Diagnosis Date Noted   Alcohol use disorder, severe, dependence (HCC) 09/22/2023   Critical polytrauma 08/21/2020   Trauma 08/11/2020   Alcohol abuse 01/23/2014

## 2023-09-24 NOTE — Group Note (Signed)
 Group Topic: Recovery Basics  Group Date: 09/24/2023 Start Time: 1205 End Time: 1300 Facilitators: Stanly Stabile, RN  Department: [facility]  Number of Participants: 13  Group Focus: chemical dependency education, chemical dependency issues, coping skills, and dual diagnosis Treatment Modality:  Behavior Modification Therapy Interventions utilized were clarification, confrontation, exploration, patient education, problem solving, and reality testing Purpose: enhance coping skills, explore maladaptive thinking, express feelings, express irrational fears, improve communication skills, increase insight, regain self-worth, reinforce self-care, relapse prevention strategies, and trigger / craving management  Name: Jake Silva Date of Birth: 24-Jun-1980  MR: [id number]    Level of Participation: active Quality of Participation: attentive, cooperative, and engaged Interactions with others: gave feedback Mood/Affect: appropriate Triggers (if applicable):   Cognition: coherent/clear and goal directed Progress: Gaining insight Response:   Plan: follow-up needed  Patients Problems:  Patient Active Problem List   Diagnosis Date Noted   Alcohol use disorder, severe, dependence (HCC) 09/22/2023   Critical polytrauma 08/21/2020   Trauma 08/11/2020   Alcohol abuse 01/23/2014

## 2023-09-24 NOTE — ED Notes (Signed)
 Pt stated his eye had been bothering him all day and it looked a little irritated so MHT let RN know.

## 2023-09-24 NOTE — ED Notes (Signed)
 Patient has problem of alcohol and substance abuse disorder. He reported sweating sometimes. No sleep disturbances. Appetite remains good. Denied N/V. He reported pain rating 7/10, location genital areas.and down to his legs. He had relapses many times.He is cooperative and calm

## 2023-09-24 NOTE — ED Provider Notes (Signed)
 Behavioral Health Progress Note  Date and Time: 09/24/2023 4:18 PM Name: Jake Silva MRN:  [id number]  Subjective:  Patient reports improving mood, sleep, and appetite. Denies feeling hopeless.  Denies significant depression or anxiety today. Patient denies significant withdrawal. Reports chronic pelvic pain and asking to try increase gabapentin  dose to 300 mg TID. Denies SI, HI or AVH. Patient states They medications you started are helping.   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  Additional Social History:                         Sleep: Fair  Appetite:  Fair  Current Medications:  Current Facility-Administered Medications  Medication Dose Route Frequency Provider Last Rate Last Admin   acetaminophen  (TYLENOL ) tablet 650 mg  650 mg Oral Q6H Fatime Biswell, MD   650 mg at 09/24/23 1141   alum & mag hydroxide-simeth (MAALOX/MYLANTA) 200-200-20 MG/5ML suspension 30 mL  30 mL Oral Q4H PRN Rollene Katz, MD       chlordiazePOXIDE  (LIBRIUM ) capsule 25 mg  25 mg Oral Daily Rollene Katz, MD       haloperidol  (HALDOL ) tablet 5 mg  5 mg Oral TID PRN Rollene Katz, MD       And   diphenhydrAMINE  (BENADRYL ) capsule 50 mg  50 mg Oral TID PRN Rollene Katz, MD       haloperidol  lactate (HALDOL ) injection 5 mg  5 mg Intramuscular TID PRN Rollene Katz, MD        And   diphenhydrAMINE  (BENADRYL ) injection 50 mg  50 mg Intramuscular TID PRN Rollene Katz, MD       And   LORazepam  (ATIVAN ) injection 2 mg  2 mg Intramuscular TID PRN Rollene Katz, MD       haloperidol  lactate (HALDOL ) injection 10 mg  10 mg Intramuscular TID PRN Rollene Katz, MD       And   diphenhydrAMINE  (BENADRYL ) injection 50 mg  50 mg Intramuscular TID PRN Rollene Katz, MD       And   LORazepam  (ATIVAN ) injection 2 mg  2 mg Intramuscular TID PRN Rollene Katz, MD       [START ON 09/25/2023] DULoxetine  (CYMBALTA ) DR capsule 60 mg  60 mg Oral Daily Braydyn Schultes, MD       gabapentin  (NEURONTIN ) capsule 300 mg  300 mg Oral TID Jeanne Diefendorf, MD   300 mg at 09/24/23 1525   hydrOXYzine  (ATARAX ) tablet 25 mg  25 mg Oral TID PRN Rollene Katz, MD       magnesium  hydroxide (MILK OF MAGNESIA) suspension 30 mL  30 mL Oral Daily PRN Rollene Katz, MD       multivitamin with minerals tablet 1 tablet  1 tablet Oral Daily Rollene Katz, MD   1 tablet at 09/24/23 9077   naltrexone  (DEPADE) tablet 50 mg  50 mg Oral Daily Grayling Schranz, MD   50  mg at 09/24/23 9078   nicotine  (NICODERM CQ  - dosed in mg/24 hours) patch 14 mg  14 mg Transdermal Q0600 Rollene Katz, MD   14 mg at 09/24/23 0920   traZODone  (DESYREL ) tablet 50 mg  50 mg Oral QHS PRN Rollene Katz, MD   50 mg at 09/23/23 2225   traZODone  (DESYREL ) tablet 50 mg  50 mg Oral QHS Pearlie Nies, MD   50 mg at 09/22/23 2138   Current Outpatient Medications  Medication Sig Dispense Refill   cyclobenzaprine  (FLEXERIL ) 10 MG tablet Take 1 tablet (10 mg total) by mouth 2 (two) times daily as needed for muscle spasms. 20 tablet 0   ibuprofen  (ADVIL ) 600 MG tablet Take 1 tablet (600 mg total) by mouth every 6 (six) hours as needed. (Patient taking differently: Take 600 mg by mouth every 6 (six) hours as needed (For pain).) 30 tablet 0    Labs  Lab Results:  Admission on 09/21/2023, Discharged  on 09/22/2023  Component Date Value Ref Range Status   WBC 09/21/2023 3.8 (L)  4.0 - 10.5 K/uL Final   RBC 09/21/2023 4.30  4.22 - 5.81 MIL/uL Final   Hemoglobin 09/21/2023 13.6  13.0 - 17.0 g/dL Final   HCT 92/91/7974 39.7  39.0 - 52.0 % Final   MCV 09/21/2023 92.3  80.0 - 100.0 fL Final   MCH 09/21/2023 31.6  26.0 - 34.0 pg Final   MCHC 09/21/2023 34.3  30.0 - 36.0 g/dL Final   RDW 92/91/7974 13.2  11.5 - 15.5 % Final   Platelets 09/21/2023 289  150 - 400 K/uL Final   nRBC 09/21/2023 0.0  0.0 - 0.2 % Final   Neutrophils Relative % 09/21/2023 42  % Final   Neutro Abs 09/21/2023 1.6 (L)  1.7 - 7.7 K/uL Final   Lymphocytes Relative 09/21/2023 47  % Final   Lymphs Abs 09/21/2023 1.8  0.7 - 4.0 K/uL Final   Monocytes Relative 09/21/2023 9  % Final   Monocytes Absolute 09/21/2023 0.3  0.1 - 1.0 K/uL Final   Eosinophils Relative 09/21/2023 1  % Final   Eosinophils Absolute 09/21/2023 0.0  0.0 - 0.5 K/uL Final   Basophils Relative 09/21/2023 1  % Final   Basophils Absolute 09/21/2023 0.1  0.0 - 0.1 K/uL Final   Immature Granulocytes 09/21/2023 0  % Final   Abs Immature Granulocytes 09/21/2023 0.01  0.00 - 0.07 K/uL Final   Performed at [facility], [address], [place], [place] [address]   Sodium 09/21/2023 140  135 - 145 mmol/L Final   Potassium 09/21/2023 4.3  3.5 - 5.1 mmol/L Final   Chloride 09/21/2023 103  98 - 111 mmol/L Final   CO2 09/21/2023 26  22 - 32 mmol/L Final   Glucose, Bld 09/21/2023 96  70 - 99 mg/dL Final   Glucose reference range applies only to samples taken after fasting for at least 8 hours.   BUN 09/21/2023 8  6 - 20 mg/dL Final   Creatinine, Ser 09/21/2023 0.83  0.61 - 1.24 mg/dL Final   Calcium 92/91/7974 9.2  8.9 - 10.3 mg/dL Final   Total Protein 92/91/7974 7.7  6.5 - 8.1 g/dL Final   Albumin 92/91/7974 4.3  3.5 - 5.0 g/dL Final   AST 92/91/7974 37  15 - 41 U/L Final   ALT 09/21/2023 26  0 - 44 U/L Final   Alkaline Phosphatase 09/21/2023 63  38 -  126 U/L Final   Total Bilirubin  09/21/2023 0.4  0.0 - 1.2 mg/dL Final   GFR, Estimated 09/21/2023 >60  >60 mL/min Final   Comment: (NOTE) Calculated using the CKD-EPI Creatinine Equation (2021)    Anion gap 09/21/2023 11  5 - 15 Final   Performed at [facility], [address] [place], [place] [address]   Hgb A1c MFr Bld 09/21/2023 5.2  4.8 - 5.6 % Final   Comment: (NOTE) Diagnosis of Diabetes The following HbA1c ranges recommended by the American Diabetes Association (ADA) may be used as an aid in the diagnosis of diabetes mellitus.  Hemoglobin             Suggested A1C NGSP%              Diagnosis  <5.7                   Non Diabetic  5.7-6.4                Pre-Diabetic  >6.4                   Diabetic  <7.0                   Glycemic control for                       adults with diabetes.     Mean Plasma Glucose 09/21/2023 102.54  mg/dL Final   Performed at [facility], [address], [place], [place] [address]   Alcohol, Ethyl (B) 09/21/2023 297 (H)  <15 mg/dL Final   Comment: (NOTE) For medical purposes only. Performed at [facility], [address], [place], [place] [address]    Cholesterol 09/21/2023 240 (H)  0 - 200 mg/dL Final   Triglycerides 92/91/7974 97  <150 mg/dL Final   HDL 92/91/7974 115  >40 mg/dL Final   Total CHOL/HDL Ratio 09/21/2023 2.1  RATIO Final   VLDL 09/21/2023 19  0 - 40 mg/dL Final   LDL Cholesterol 09/21/2023 106 (H)  0 - 99 mg/dL Final   Comment:        Total Cholesterol/HDL:CHD Risk Coronary Heart Disease Risk Table                     Men   Women  1/2 Average Risk   3.4   3.3  Average Risk       5.0   4.4  2 X Average Risk   9.6   7.1  3 X Average Risk  23.4   11.0        Use the calculated Patient Ratio above and the CHD Risk Table to determine the patient's CHD Risk.        ATP III CLASSIFICATION (LDL):  <100     mg/dL   Optimal  899-870  mg/dL   Near or Above                    Optimal  130-159   mg/dL   Borderline  839-810  mg/dL   High  >809     mg/dL   Very High Performed at [facility], [address] [place], [place] [address]    TSH 09/21/2023 1.505  0.350 - 4.500 uIU/mL Final   Comment: Performed by a 3rd Generation assay with a functional sensitivity of <=0.01 uIU/mL. Performed at [facility], [address], [place], [place] [address]    Color, Urine 09/21/2023 YELLOW  YELLOW Final   APPearance 09/21/2023 CLEAR  CLEAR Final   Specific Gravity, Urine 09/21/2023 1.010  1.005 - 1.030 Final   pH 09/21/2023 5.0  5.0 - 8.0 Final   Glucose, UA 09/21/2023 NEGATIVE  NEGATIVE mg/dL Final   Hgb urine dipstick 09/21/2023 NEGATIVE  NEGATIVE Final   Bilirubin Urine 09/21/2023 NEGATIVE  NEGATIVE Final   Ketones, ur 09/21/2023 NEGATIVE  NEGATIVE mg/dL Final   Protein, ur 92/91/7974 NEGATIVE  NEGATIVE mg/dL Final   Nitrite 92/91/7974 NEGATIVE  NEGATIVE Final   Leukocytes,Ua 09/21/2023 NEGATIVE  NEGATIVE Final   Performed at [facility], [address] [place], [place] [address]   POC Amphetamine UR 09/21/2023 None Detected  NONE DETECTED (Cut Off Level 1000 ng/mL) Final   POC Secobarbital (BAR) 09/21/2023 None Detected  NONE DETECTED (Cut Off Level 300 ng/mL) Final   POC Buprenorphine (BUP) 09/21/2023 None Detected  NONE DETECTED (Cut Off Level 10 ng/mL) Final   POC Oxazepam (BZO) 09/21/2023 None Detected  NONE DETECTED (Cut Off Level 300 ng/mL) Final   POC Cocaine UR 09/21/2023 Positive (A)  NONE DETECTED (Cut Off Level 300 ng/mL) Final   POC Methamphetamine UR 09/21/2023 None Detected  NONE DETECTED (Cut Off Level 1000 ng/mL) Final   POC Morphine  09/21/2023 None Detected  NONE DETECTED (Cut Off Level 300 ng/mL) Final   POC Methadone UR 09/21/2023 None Detected  NONE DETECTED (Cut Off Level 300 ng/mL) Final   POC Oxycodone  UR 09/21/2023 None Detected  NONE DETECTED (Cut Off Level 100 ng/mL) Final   POC Marijuana UR 09/21/2023 None Detected  NONE DETECTED (Cut Off  Level 50 ng/mL) Final   Vit D, 25-Hydroxy 09/21/2023 14.75 (L)  30 - 100 ng/mL Final   Comment: (NOTE) Vitamin D  deficiency has been defined by the Institute of Medicine  and an Endocrine Society practice guideline as a level of serum 25-OH  vitamin D  less than 20 ng/mL (1,2). The Endocrine Society went on to  further define vitamin D  insufficiency as a level between 21 and 29  ng/mL (2).  1. IOM (Institute of Medicine). 2010. Dietary reference intakes for  calcium and D. [place]: [employer]. 2. Holick MF, Binkley [place], Bischoff-Ferrari HA, et al. Evaluation,  treatment, and prevention of vitamin D  deficiency: an Endocrine  Society clinical practice guideline, JCEM. 2011 Jul; 96(7): 1911-30.  Performed at [facility], [address], [place], [place] [address]    Vitamin B-12 09/21/2023 290  180 - 914 pg/mL Final   Comment: (NOTE) This assay is not validated for testing neonatal or myeloproliferative syndrome specimens for Vitamin B12 levels. Performed at [facility], [address] [place], [place] [address]   Admission on 09/11/2023, Discharged on 09/11/2023  Component Date Value Ref Range Status   WBC 09/11/2023 5.1  4.0 - 10.5 K/uL Final   RBC 09/11/2023 3.88 (L)  4.22 - 5.81 MIL/uL Final   Hemoglobin 09/11/2023 12.5 (L)  13.0 - 17.0 g/dL Final   HCT 93/71/7974 36.2 (L)  39.0 - 52.0 % Final   MCV 09/11/2023 93.3  80.0 - 100.0 fL Final   MCH 09/11/2023 32.2  26.0 - 34.0 pg Final   MCHC 09/11/2023 34.5  30.0 - 36.0 g/dL Final   RDW 93/71/7974 13.0  11.5 - 15.5 % Final   Platelets 09/11/2023 229  150 - 400 K/uL Final   nRBC 09/11/2023 0.0  0.0 - 0.2 % Final   Performed at [facility], [address] [place], [place] [address]   Color, Urine 09/11/2023 YELLOW  YELLOW Final   APPearance 09/11/2023 CLEAR  CLEAR Final   Specific Gravity, Urine 09/11/2023 1.029  1.005 - 1.030 Final   pH 09/11/2023 5.0  5.0 - 8.0 Final   Glucose, UA  09/11/2023 NEGATIVE  NEGATIVE mg/dL Final   Hgb urine dipstick 09/11/2023 NEGATIVE  NEGATIVE Final   Bilirubin Urine 09/11/2023 NEGATIVE  NEGATIVE Final   Ketones, ur 09/11/2023 NEGATIVE  NEGATIVE mg/dL Final   Protein, ur 93/71/7974 NEGATIVE  NEGATIVE mg/dL Final   Nitrite 93/71/7974 NEGATIVE  NEGATIVE Final   Leukocytes,Ua 09/11/2023 NEGATIVE  NEGATIVE Final   Performed at [facility], [address], [place], [place] [address]   Sodium 09/11/2023 139  135 - 145 mmol/L Final   Potassium 09/11/2023 3.8  3.5 - 5.1 mmol/L Final   Chloride 09/11/2023 105  98 - 111 mmol/L Final   CO2 09/11/2023 20 (L)  22 - 32 mmol/L Final   Glucose, Bld 09/11/2023 92  70 - 99 mg/dL Final   Glucose reference range applies only to samples taken after fasting for at least 8 hours.   BUN 09/11/2023 9  6 - 20 mg/dL Final   Creatinine, Ser 09/11/2023 0.87  0.61 - 1.24 mg/dL Final   Calcium 93/71/7974 9.1  8.9 - 10.3 mg/dL Final   Total Protein 93/71/7974 6.8  6.5 - 8.1 g/dL Final   Albumin 93/71/7974 3.8  3.5 - 5.0 g/dL Final   AST 93/71/7974 37  15 - 41 U/L Final   ALT 09/11/2023 21  0 - 44 U/L Final   Alkaline Phosphatase 09/11/2023 52  38 - 126 U/L Final   Total Bilirubin 09/11/2023 0.3  0.0 - 1.2 mg/dL Final   GFR, Estimated 09/11/2023 >60  >60 mL/min Final   Comment: (NOTE) Calculated using the CKD-EPI Creatinine Equation (2021)    Anion gap 09/11/2023 14  5 - 15 Final   Performed at [facility], [address] [place], [place] [address]  Admission on 08/27/2023, Discharged on 08/27/2023  Component Date Value Ref Range Status   Specimen Source 08/27/2023 URINE, CLEAN CATCH   Final   Color, Urine 08/27/2023 COLORLESS (A)  YELLOW Final   APPearance 08/27/2023 CLEAR  CLEAR Final   Specific Gravity, Urine 08/27/2023 1.002 (L)  1.005 - 1.030 Final   pH 08/27/2023 5.0  5.0 - 8.0 Final   Glucose, UA 08/27/2023 NEGATIVE  NEGATIVE mg/dL Final   Hgb urine dipstick 08/27/2023 NEGATIVE  NEGATIVE  Final   Bilirubin Urine 08/27/2023 NEGATIVE  NEGATIVE Final   Ketones, ur 08/27/2023 NEGATIVE  NEGATIVE mg/dL Final   Protein, ur 93/86/7974 NEGATIVE  NEGATIVE mg/dL Final   Nitrite 93/86/7974 NEGATIVE  NEGATIVE Final   Leukocytes,Ua 08/27/2023 NEGATIVE  NEGATIVE Final   RBC / HPF 08/27/2023 0-5  0 - 5 RBC/hpf Final   WBC, UA 08/27/2023 0-5  0 - 5 WBC/hpf Final   Comment:        Reflex urine culture not performed if WBC <=10, OR if Squamous epithelial cells >5. If Squamous epithelial cells >5 suggest recollection.    Bacteria, UA 08/27/2023 NONE SEEN  NONE SEEN Final   Squamous Epithelial / HPF 08/27/2023 0-5  0 - 5 /HPF Final   Performed at [facility], [address], [place], [place] [address]   Neisseria Gonorrhea 08/27/2023 Negative  Final   Chlamydia 08/27/2023 Negative   Final   Comment 08/27/2023 Normal Reference Ranger Chlamydia - Negative   Final   Comment 08/27/2023 Normal Reference Range Neisseria Gonorrhea - Negative   Final  Admission on 08/11/2023, Discharged on 08/11/2023  Component Date Value Ref Range Status   Sodium 08/11/2023 139  135 - 145 mmol/L Final   Potassium 08/11/2023 4.2  3.5 - 5.1 mmol/L Final   Chloride 08/11/2023 107  98 - 111 mmol/L Final   CO2 08/11/2023 21 (L)  22 - 32 mmol/L Final   Glucose, Bld 08/11/2023 88  70 - 99 mg/dL Final   Glucose reference range applies only to samples taken after fasting for at least 8 hours.   BUN 08/11/2023 9  6 - 20 mg/dL Final   Creatinine, Ser 08/11/2023 0.84  0.61 - 1.24 mg/dL Final   Calcium 94/71/7974 8.7 (L)  8.9 - 10.3 mg/dL Final   Total Protein 94/71/7974 7.0  6.5 - 8.1 g/dL Final   Albumin 94/71/7974 3.9  3.5 - 5.0 g/dL Final   AST 94/71/7974 73 (H)  15 - 41 U/L Final   ALT 08/11/2023 41  0 - 44 U/L Final   Alkaline Phosphatase 08/11/2023 73  38 - 126 U/L Final   Total Bilirubin 08/11/2023 0.3  0.0 - 1.2 mg/dL Final   GFR, Estimated 08/11/2023 >60  >60 mL/min Final   Comment: (NOTE) Calculated  using the CKD-EPI Creatinine Equation (2021)    Anion gap 08/11/2023 11  5 - 15 Final   Performed at [facility], [address] [place], [place] [address]   Alcohol, Ethyl (B) 08/11/2023 344 (HH)  <15 mg/dL Final   Comment: CRITICAL RESULT CALLED TO, READ BACK BY AND VERIFIED WITH KRISTA CHAPLIN,RN AT 1021 08/11/2023 BY ZBEECH. (NOTE) For medical purposes only. Performed at [facility], [address] [place], [place] [address]    Opiates 08/11/2023 NONE DETECTED  NONE DETECTED Final   Cocaine 08/11/2023 NONE DETECTED  NONE DETECTED Final   Benzodiazepines 08/11/2023 NONE DETECTED  NONE DETECTED Final   Amphetamines 08/11/2023 NONE DETECTED  NONE DETECTED Final   Tetrahydrocannabinol 08/11/2023 NONE DETECTED  NONE DETECTED Final   Barbiturates 08/11/2023 NONE DETECTED  NONE DETECTED Final   Comment: (NOTE) DRUG SCREEN FOR MEDICAL PURPOSES ONLY.  IF CONFIRMATION IS NEEDED FOR ANY PURPOSE, NOTIFY LAB WITHIN 5 DAYS.  LOWEST DETECTABLE LIMITS FOR URINE DRUG SCREEN Drug Class                     Cutoff (ng/mL) Amphetamine and metabolites    1000 Barbiturate and metabolites    200 Benzodiazepine                 200 Opiates and metabolites        300 Cocaine and metabolites        300 THC                            50 Performed at [facility], [address], [place], [place] [address]    WBC 08/11/2023 4.8  4.0 - 10.5 K/uL Final   RBC 08/11/2023 4.18 (L)  4.22 - 5.81 MIL/uL Final   Hemoglobin 08/11/2023 13.2  13.0 - 17.0 g/dL Final   HCT 94/71/7974 40.0  39.0 - 52.0 % Final   MCV 08/11/2023 95.7  80.0 - 100.0 fL Final   MCH  08/11/2023 31.6  26.0 - 34.0 pg Final   MCHC 08/11/2023 33.0  30.0 - 36.0 g/dL Final   RDW 94/71/7974 13.4  11.5 - 15.5 % Final   Platelets 08/11/2023 292  150 - 400 K/uL Final   nRBC 08/11/2023 0.0  0.0 - 0.2 % Final   Neutrophils Relative % 08/11/2023 41  % Final   Neutro Abs 08/11/2023 2.0  1.7 - 7.7 K/uL Final   Lymphocytes  Relative 08/11/2023 50  % Final   Lymphs Abs 08/11/2023 2.4  0.7 - 4.0 K/uL Final   Monocytes Relative 08/11/2023 7  % Final   Monocytes Absolute 08/11/2023 0.3  0.1 - 1.0 K/uL Final   Eosinophils Relative 08/11/2023 1  % Final   Eosinophils Absolute 08/11/2023 0.0  0.0 - 0.5 K/uL Final   Basophils Relative 08/11/2023 1  % Final   Basophils Absolute 08/11/2023 0.1  0.0 - 0.1 K/uL Final   Immature Granulocytes 08/11/2023 0  % Final   Abs Immature Granulocytes 08/11/2023 0.01  0.00 - 0.07 K/uL Final   Performed at [facility], [address] [place], [place] [address]  Admission on 07/21/2023, Discharged on 07/21/2023  Component Date Value Ref Range Status   Lipase 07/21/2023 46  11 - 51 U/L Final   Performed at [facility], [address] [place], [place] [address]   Sodium 07/21/2023 131 (L)  135 - 145 mmol/L Final   Potassium 07/21/2023 4.7  3.5 - 5.1 mmol/L Final   HEMOLYSIS AT THIS LEVEL MAY AFFECT RESULT   Chloride 07/21/2023 98  98 - 111 mmol/L Final   CO2 07/21/2023 19 (L)  22 - 32 mmol/L Final   Glucose, Bld 07/21/2023 92  70 - 99 mg/dL Final   Glucose reference range applies only to samples taken after fasting for at least 8 hours.   BUN 07/21/2023 9  6 - 20 mg/dL Final   Creatinine, Ser 07/21/2023 0.90  0.61 - 1.24 mg/dL Final   Calcium 94/92/7974 9.1  8.9 - 10.3 mg/dL Final   Total Protein 94/92/7974 7.3  6.5 - 8.1 g/dL Final   Albumin 94/92/7974 4.0  3.5 - 5.0 g/dL Final   AST 94/92/7974 54 (H)  15 - 41 U/L Final   HEMOLYSIS AT THIS LEVEL MAY AFFECT RESULT   ALT 07/21/2023 33  0 - 44 U/L Final   HEMOLYSIS AT THIS LEVEL MAY AFFECT RESULT   Alkaline Phosphatase 07/21/2023 76  38 - 126 U/L Final   Total Bilirubin 07/21/2023 1.4 (H)  0.0 - 1.2 mg/dL Final   HEMOLYSIS AT THIS LEVEL MAY AFFECT RESULT   GFR, Estimated 07/21/2023 >60  >60 mL/min Final   Comment: (NOTE) Calculated using the CKD-EPI Creatinine Equation (2021)    Anion gap 07/21/2023 14  5 - 15  Final   Performed at [facility], [address] [place], [place] [address]   WBC 07/21/2023 4.3  4.0 - 10.5 K/uL Final   RBC 07/21/2023 4.41  4.22 - 5.81 MIL/uL Final   Hemoglobin 07/21/2023 14.0  13.0 - 17.0 g/dL Final   HCT 94/92/7974 41.9  39.0 - 52.0 % Final   MCV 07/21/2023 95.0  80.0 - 100.0 fL Final   MCH 07/21/2023 31.7  26.0 - 34.0 pg Final   MCHC 07/21/2023 33.4  30.0 - 36.0 g/dL Final   RDW 94/92/7974 12.7  11.5 - 15.5 % Final   Platelets 07/21/2023 234  150 - 400 K/uL Final  nRBC 07/21/2023 0.0  0.0 - 0.2 % Final   Performed at [facility], [address] [place], [place] [address]    Blood Alcohol level:  Lab Results  Component Value Date   ETH 297 (H) 09/21/2023   ETH 344 (HH) 08/11/2023    Metabolic Disorder Labs: Lab Results  Component Value Date   HGBA1C 5.2 09/21/2023   MPG 102.54 09/21/2023   No results found for: PROLACTIN Lab Results  Component Value Date   CHOL 240 (H) 09/21/2023   TRIG 97 09/21/2023   HDL 115 09/21/2023   CHOLHDL 2.1 09/21/2023   VLDL 19 09/21/2023   LDLCALC 106 (H) 09/21/2023    Therapeutic Lab Levels: No results found for: LITHIUM No results found for: VALPROATE No results found for: CBMZ  Physical Findings   CAGE-AID    Flowsheet Row ED to Hosp-Admission (Discharged) from 08/11/2020 in [facility]  CAGE-AID Score 0   Flowsheet Row ED from 09/22/2023 in [facility] ED from 09/21/2023 in [facility] ED from 09/13/2023 in [facility] Department at [facility]  C-SSRS RISK CATEGORY No Risk No Risk No Risk     Musculoskeletal  Strength & Muscle Tone: within normal limits Gait & Station: normal Patient leans: N/A  Psychiatric Specialty Exam  Presentation  General Appearance:  Appropriate for Environment  Eye Contact: Fair  Speech: Clear and Coherent  Speech  Volume: Normal  Handedness:No data recorded  Mood and Affect  Mood: Depressed  Affect: Appropriate   Thought Process  Thought Processes: Goal Directed  Descriptions of Associations:Intact  Orientation:Full (Time, Place and Person)  Thought Content:Logical  Diagnosis of Schizophrenia or Schizoaffective disorder in past: No    Hallucinations:Hallucinations: None  Ideas of Reference:None  Suicidal Thoughts:Suicidal Thoughts: No  Homicidal Thoughts:Homicidal Thoughts: No   Sensorium  Memory: Immediate Fair  Judgment: Fair  Insight: Fair   Art therapist  Concentration: Fair  Attention Span: Fair  Recall: [employer] of Knowledge: Fair  Language: Fair   Psychomotor Activity  Psychomotor Activity: Psychomotor Activity: Normal   Assets  Assets: Communication Skills; Desire for Improvement   Sleep  Sleep: Sleep: Fair  Estimated Sleeping Duration (Last 24 Hours): 8.00-8.75 hours  No data recorded  Physical Exam     Physical exam:  General: Well developed, well nourished, [place] male Pupils: Normal at 3mm Respiratory: Breathing is unlabored.  Cardiovascular: No edema.  Language: No anomia, no aphasia Muscle strength and tone-pt moving all extremities.  Gait not assessed as pt remained in bed.  Neuro: Facial muscles are symmetric. Pt without tremor, no evidence of hyperarousal.    Review of Systems  Constitutional:  Positive for fatigue.  HENT: Negative.    Eyes: Negative.   Respiratory: Negative.    Cardiovascular: Negative.   Gastrointestinal: Negative.   Genitourinary: Negative.   Musculoskeletal:  Positive for joint pain.  Skin: Negative.   Neurological: Negative.   Endo/Heme/Allergies: Negative.   Psychiatric/Behavioral:  Positive for depression. Negative for hallucinations and paranoia. The patient is nervous/anxious and has insomnia.   Blood pressure 121/83, pulse 74, temperature 98.2 F (36.8 C),  temperature source Oral, resp. rate 18, SpO2 100%. There is no height or weight on file to calculate BMI.  Treatment Plan Summary: Daily contact with patient to assess and evaluate symptoms and progress in treatment and Medication management  43 y.o. male with a history of cocaine abuse, alcohol  abuse, and depression, anxiety who was brought to  [facility] by Patent examiner. No information was provided by law enforcement prior to them leaving the facility. Patient denies history of psychiatric admissions or suicide attempts. Denies SI or HI today but presented to detox from alcohol and cocaine and start a medication for depression, anxiety and chronic pain. Symptoms most consistent with MDD and GAD based on patient history however cannot rule out substance induced mood disorder. Discussed r/b/a of starting Cymbalta  and gabapentin  and patient is agreeable.   7/10: reports improving mood, sleep and appetite. Denies SI, HI or AVH. Denies side effects from medications. Withdrawls are well controlled with CIWA <5. Discussed r/b/a of starting naltrexone  50 mg qdaily and patient would like to try.  7/11: finishing librium  taper. Chronic pain asking for increase of gabapentin . Denies SI, HI or AVH. Also agreeable to increase of Cymbalta  tomorrow morning.   #alcohol use disorder, severe with withdrawal -continue Librium  taper + CIWA protocol with PRN Librium  -increase gabapentin  to 300 mg TID for alcohol withdrawal, anxiety, and pain -continue trazodone  50 mg at bedtime-PRN for insomnia -hydroxyzine  25 mg TID-PRN for anxiety -- cont naltrexone  50 mg qdaily   #(MDD, severe, recurrent without psychotic features, rule out substance induced mood disorder -patient has not had any extended period of sobriety recently but state he was depressed every day when sober in jail for 90 days last years - stopped Lexapro  5 mg qdaily, discussed r/b/a for off label use with  patient -increase Cymbalta  to 60 mg qdaily for neuropathy, depression, anxiety    #GAD -increase Cymbalta  to 60 mg qdaily for neuropathy, depression, anxiety  -continue trazodone  50 mg at bedtime-PRN for insomnia -hydroxyzine  25 mg TID-PRN for anxiety   Dispo: sober living or inpatient rehab, DC Monday to [facility] Eshal Propps, MD 09/24/2023 4:18 PM

## 2023-09-25 DIAGNOSIS — F32A Depression, unspecified: Secondary | ICD-10-CM | POA: Diagnosis not present

## 2023-09-25 DIAGNOSIS — F1024 Alcohol dependence with alcohol-induced mood disorder: Secondary | ICD-10-CM | POA: Diagnosis not present

## 2023-09-25 DIAGNOSIS — F10230 Alcohol dependence with withdrawal, uncomplicated: Secondary | ICD-10-CM | POA: Diagnosis not present

## 2023-09-25 DIAGNOSIS — F1420 Cocaine dependence, uncomplicated: Secondary | ICD-10-CM | POA: Diagnosis not present

## 2023-09-25 MED ORDER — GABAPENTIN 400 MG PO CAPS
400.0000 mg | ORAL_CAPSULE | Freq: Three times a day (TID) | ORAL | Status: DC
  Administered 2023-09-25 – 2023-09-27 (×6): 400 mg via ORAL
  Filled 2023-09-25 (×6): qty 1

## 2023-09-25 MED ORDER — NAPHAZOLINE-GLYCERIN 0.012-0.25 % OP SOLN
1.0000 [drp] | Freq: Two times a day (BID) | OPHTHALMIC | Status: DC | PRN
  Administered 2023-09-25 – 2023-09-26 (×3): 2 [drp] via OPHTHALMIC

## 2023-09-25 MED ORDER — TRAZODONE HCL 100 MG PO TABS
100.0000 mg | ORAL_TABLET | Freq: Every day | ORAL | Status: DC
  Administered 2023-09-25 – 2023-09-26 (×2): 100 mg via ORAL
  Filled 2023-09-25 (×2): qty 1

## 2023-09-25 NOTE — ED Provider Notes (Signed)
 Behavioral Health Progress Note  Date and Time: 09/25/2023 1:11 PM Name: Jake Silva MRN:  [id number]  Subjective:   Patient reports improving mood and appetite although reports residual feelings of depression. Denies feeling hopeless or wanting to be dead and patient is future oriented on sobriety. Patient reports chronic hip pain from pelvic fracture. Tolerating Cymbalta  and Gabapentin  increases and denies any side effects.  Patient denies significant withdrawal. Reports chronic pelvic pain and asking to try increase gabapentin  dose to 400 mg TID. Denies SI, HI or AVH.  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  Additional Social History:                         Sleep: Fair  Appetite:  Fair  Current Medications:  Current Facility-Administered Medications  Medication Dose Route Frequency Provider Last Rate Last Admin   acetaminophen  (TYLENOL ) tablet 650 mg  650 mg Oral Q6H Doloris Servantes, MD   650 mg at 09/25/23 0519   alum & mag hydroxide-simeth (MAALOX/MYLANTA) 200-200-20 MG/5ML suspension 30 mL  30 mL Oral Q4H PRN Rollene Katz, MD       haloperidol  (HALDOL ) tablet 5 mg  5 mg Oral TID PRN Rollene Katz, MD       And   diphenhydrAMINE  (BENADRYL ) capsule 50 mg  50 mg Oral TID PRN Rollene Katz, MD       haloperidol  lactate (HALDOL ) injection 5 mg  5 mg  Intramuscular TID PRN Rollene Katz, MD       And   diphenhydrAMINE  (BENADRYL ) injection 50 mg  50 mg Intramuscular TID PRN Rollene Katz, MD       And   LORazepam  (ATIVAN ) injection 2 mg  2 mg Intramuscular TID PRN Rollene Katz, MD       haloperidol  lactate (HALDOL ) injection 10 mg  10 mg Intramuscular TID PRN Rollene Katz, MD       And   diphenhydrAMINE  (BENADRYL ) injection 50 mg  50 mg Intramuscular TID PRN Rollene Katz, MD       And   LORazepam  (ATIVAN ) injection 2 mg  2 mg Intramuscular TID PRN Rollene Katz, MD       DULoxetine  (CYMBALTA ) DR capsule 60 mg  60 mg Oral Daily Derrian Poli, MD   60 mg at 09/25/23 9080   gabapentin  (NEURONTIN ) capsule 400 mg  400 mg Oral TID Charliee Krenz, MD       hydrOXYzine  (ATARAX ) tablet 25 mg  25 mg Oral TID PRN Rollene Katz, MD       magnesium  hydroxide (MILK OF MAGNESIA) suspension 30 mL  30 mL Oral Daily PRN Rollene Katz, MD       multivitamin with minerals tablet 1 tablet  1 tablet Oral Daily Rollene Katz, MD   1 tablet at 09/25/23 0919   naltrexone  (DEPADE) tablet 50 mg  50 mg Oral Daily Ilhan Debenedetto, MD   50 mg  at 09/25/23 0919   naphazoline-glycerin  (CLEAR EYES REDNESS) ophth solution 1-2 drop  1-2 drop Both Eyes BID PRN Bobbitt, Shalon E, NP   2 drop at 09/25/23 0920   nicotine  (NICODERM CQ  - dosed in mg/24 hours) patch 14 mg  14 mg Transdermal Q0600 Rollene Katz, MD   14 mg at 09/25/23 9480   traZODone  (DESYREL ) tablet 100 mg  100 mg Oral QHS Jaydalyn Demattia, MD       traZODone  (DESYREL ) tablet 50 mg  50 mg Oral QHS PRN Rollene Katz, MD   50 mg at 09/23/23 2225   Current Outpatient Medications  Medication Sig Dispense Refill   cyclobenzaprine  (FLEXERIL ) 10 MG tablet Take 1 tablet (10 mg total) by mouth 2 (two) times daily as needed for muscle spasms. 20 tablet 0   ibuprofen  (ADVIL ) 600 MG tablet Take 1 tablet (600 mg total) by mouth every 6 (six) hours as needed. (Patient taking  differently: Take 600 mg by mouth every 6 (six) hours as needed (For pain).) 30 tablet 0    Labs  Lab Results:  Admission on 09/21/2023, Discharged on 09/22/2023  Component Date Value Ref Range Status   WBC 09/21/2023 3.8 (L)  4.0 - 10.5 K/uL Final   RBC 09/21/2023 4.30  4.22 - 5.81 MIL/uL Final   Hemoglobin 09/21/2023 13.6  13.0 - 17.0 g/dL Final   HCT 92/91/7974 39.7  39.0 - 52.0 % Final   MCV 09/21/2023 92.3  80.0 - 100.0 fL Final   MCH 09/21/2023 31.6  26.0 - 34.0 pg Final   MCHC 09/21/2023 34.3  30.0 - 36.0 g/dL Final   RDW 92/91/7974 13.2  11.5 - 15.5 % Final   Platelets 09/21/2023 289  150 - 400 K/uL Final   nRBC 09/21/2023 0.0  0.0 - 0.2 % Final   Neutrophils Relative % 09/21/2023 42  % Final   Neutro Abs 09/21/2023 1.6 (L)  1.7 - 7.7 K/uL Final   Lymphocytes Relative 09/21/2023 47  % Final   Lymphs Abs 09/21/2023 1.8  0.7 - 4.0 K/uL Final   Monocytes Relative 09/21/2023 9  % Final   Monocytes Absolute 09/21/2023 0.3  0.1 - 1.0 K/uL Final   Eosinophils Relative 09/21/2023 1  % Final   Eosinophils Absolute 09/21/2023 0.0  0.0 - 0.5 K/uL Final   Basophils Relative 09/21/2023 1  % Final   Basophils Absolute 09/21/2023 0.1  0.0 - 0.1 K/uL Final   Immature Granulocytes 09/21/2023 0  % Final   Abs Immature Granulocytes 09/21/2023 0.01  0.00 - 0.07 K/uL Final   Performed at [facility] Lab, [address], [place], [place] [address]   Sodium 09/21/2023 140  135 - 145 mmol/L Final   Potassium 09/21/2023 4.3  3.5 - 5.1 mmol/L Final   Chloride 09/21/2023 103  98 - 111 mmol/L Final   CO2 09/21/2023 26  22 - 32 mmol/L Final   Glucose, Bld 09/21/2023 96  70 - 99 mg/dL Final   Glucose reference range applies only to samples taken after fasting for at least 8 hours.   BUN 09/21/2023 8  6 - 20 mg/dL Final   Creatinine, Ser 09/21/2023 0.83  0.61 - 1.24 mg/dL Final   Calcium 92/91/7974 9.2  8.9 - 10.3 mg/dL Final   Total Protein 92/91/7974 7.7  6.5 - 8.1 g/dL Final   Albumin  92/91/7974 4.3  3.5 - 5.0 g/dL Final   AST 92/91/7974 37  15 - 41 U/L Final   ALT  09/21/2023 26  0 - 44 U/L Final   Alkaline Phosphatase 09/21/2023 63  38 - 126 U/L Final   Total Bilirubin 09/21/2023 0.4  0.0 - 1.2 mg/dL Final   GFR, Estimated 09/21/2023 >60  >60 mL/min Final   Comment: (NOTE) Calculated using the CKD-EPI Creatinine Equation (2021)    Anion gap 09/21/2023 11  5 - 15 Final   Performed at [facility] Lab, [address] [place], [place] [address]   Hgb A1c MFr Bld 09/21/2023 5.2  4.8 - 5.6 % Final   Comment: (NOTE) Diagnosis of Diabetes The following HbA1c ranges recommended by the American Diabetes Association (ADA) may be used as an aid in the diagnosis of diabetes mellitus.  Hemoglobin             Suggested A1C NGSP%              Diagnosis  <5.7                   Non Diabetic  5.7-6.4                Pre-Diabetic  >6.4                   Diabetic  <7.0                   Glycemic control for                       adults with diabetes.     Mean Plasma Glucose 09/21/2023 102.54  mg/dL Final   Performed at [facility] Lab, [address] [place], [place] [address]   Alcohol, Ethyl (B) 09/21/2023 297 (H)  <15 mg/dL Final   Comment: (NOTE) For medical purposes only. Performed at [facility] Lab, [address] [place], [place] [address]    Cholesterol 09/21/2023 240 (H)  0 - 200 mg/dL Final   Triglycerides 92/91/7974 97  <150 mg/dL Final   HDL 92/91/7974 115  >40 mg/dL Final   Total CHOL/HDL Ratio 09/21/2023 2.1  RATIO Final   VLDL 09/21/2023 19  0 - 40 mg/dL Final   LDL Cholesterol 09/21/2023 106 (H)  0 - 99 mg/dL Final   Comment:        Total Cholesterol/HDL:CHD Risk Coronary Heart Disease Risk Table                     Men   Women  1/2 Average Risk   3.4   3.3  Average Risk       5.0   4.4  2 X Average Risk   9.6   7.1  3 X Average Risk  23.4   11.0        Use the calculated Patient Ratio above and the CHD Risk Table to determine the  patient's CHD Risk.        ATP III CLASSIFICATION (LDL):  <100     mg/dL   Optimal  899-870  mg/dL   Near or Above                    Optimal  130-159  mg/dL   Borderline  839-810  mg/dL   High  >809     mg/dL   Very High Performed at [facility] Lab, [address] [place], [place] [address]    TSH 09/21/2023 1.505  0.350 - 4.500 uIU/mL Final  Comment: Performed by a 3rd Generation assay with a functional sensitivity of <=0.01 uIU/mL. Performed at [facility] Lab, [address] [place], [place] [address]    Color, Urine 09/21/2023 YELLOW  YELLOW Final   APPearance 09/21/2023 CLEAR  CLEAR Final   Specific Gravity, Urine 09/21/2023 1.010  1.005 - 1.030 Final   pH 09/21/2023 5.0  5.0 - 8.0 Final   Glucose, UA 09/21/2023 NEGATIVE  NEGATIVE mg/dL Final   Hgb urine dipstick 09/21/2023 NEGATIVE  NEGATIVE Final   Bilirubin Urine 09/21/2023 NEGATIVE  NEGATIVE Final   Ketones, ur 09/21/2023 NEGATIVE  NEGATIVE mg/dL Final   Protein, ur 92/91/7974 NEGATIVE  NEGATIVE mg/dL Final   Nitrite 92/91/7974 NEGATIVE  NEGATIVE Final   Leukocytes,Ua 09/21/2023 NEGATIVE  NEGATIVE Final   Performed at [facility] Lab, [address] [place], [place] [address]   POC Amphetamine UR 09/21/2023 None Detected  NONE DETECTED (Cut Off Level 1000 ng/mL) Final   POC Secobarbital (BAR) 09/21/2023 None Detected  NONE DETECTED (Cut Off Level 300 ng/mL) Final   POC Buprenorphine (BUP) 09/21/2023 None Detected  NONE DETECTED (Cut Off Level 10 ng/mL) Final   POC Oxazepam (BZO) 09/21/2023 None Detected  NONE DETECTED (Cut Off Level 300 ng/mL) Final   POC Cocaine UR 09/21/2023 Positive (A)  NONE DETECTED (Cut Off Level 300 ng/mL) Final   POC Methamphetamine UR 09/21/2023 None Detected  NONE DETECTED (Cut Off Level 1000 ng/mL) Final   POC Morphine  09/21/2023 None Detected  NONE DETECTED (Cut Off Level 300 ng/mL) Final   POC Methadone UR 09/21/2023 None Detected  NONE DETECTED (Cut Off Level 300 ng/mL) Final    POC Oxycodone  UR 09/21/2023 None Detected  NONE DETECTED (Cut Off Level 100 ng/mL) Final   POC Marijuana UR 09/21/2023 None Detected  NONE DETECTED (Cut Off Level 50 ng/mL) Final   Vit D, 25-Hydroxy 09/21/2023 14.75 (L)  30 - 100 ng/mL Final   Comment: (NOTE) Vitamin D  deficiency has been defined by the Institute of Medicine  and an Endocrine Society practice guideline as a level of serum 25-OH  vitamin D  less than 20 ng/mL (1,2). The Endocrine Society went on to  further define vitamin D  insufficiency as a level between 21 and 29  ng/mL (2).  1. IOM (Institute of Medicine). 2010. Dietary reference intakes for  calcium and D. [place]: [employer]. 2. Holick MF, Bink[place], Bischoff-Ferrari HA, et al. Evaluation,  treatment, and prevention of vitamin D  deficiency: an Endocrine  Society clinical practice guideline, JCEM. 2011 Jul; 96(7): 1911-30.  Performed at [facility] Lab, [address], [place], [place] [address]    Vitamin B-12 09/21/2023 290  180 - 914 pg/mL Final   Comment: (NOTE) This assay is not validated for testing neonatal or myeloproliferative syndrome specimens for Vitamin B12 levels. Performed at [facility] Lab, [address] [place], [place] [address]   Admission on 09/11/2023, Discharged on 09/11/2023  Component Date Value Ref Range Status   WBC 09/11/2023 5.1  4.0 - 10.5 K/uL Final   RBC 09/11/2023 3.88 (L)  4.22 - 5.81 MIL/uL Final   Hemoglobin 09/11/2023 12.5 (L)  13.0 - 17.0 g/dL Final   HCT 93/71/7974 36.2 (L)  39.0 - 52.0 % Final   MCV 09/11/2023 93.3  80.0 - 100.0 fL Final   MCH 09/11/2023 32.2  26.0 - 34.0 pg Final   MCHC 09/11/2023 34.5  30.0 - 36.0 g/dL Final   RDW 93/71/7974 13.0  11.5 - 15.5 % Final   Platelets 09/11/2023 229  150 - 400 K/uL Final   nRBC 09/11/2023 0.0  0.0 - 0.2 % Final   Performed at [facility] Lab, [address], [place], [place] [address]   Color, Urine 09/11/2023 YELLOW  YELLOW Final    APPearance 09/11/2023 CLEAR  CLEAR Final   Specific Gravity, Urine 09/11/2023 1.029  1.005 - 1.030 Final   pH 09/11/2023 5.0  5.0 - 8.0 Final   Glucose, UA 09/11/2023 NEGATIVE  NEGATIVE mg/dL Final   Hgb urine dipstick 09/11/2023 NEGATIVE  NEGATIVE Final   Bilirubin Urine 09/11/2023 NEGATIVE  NEGATIVE Final   Ketones, ur 09/11/2023 NEGATIVE  NEGATIVE mg/dL Final   Protein, ur 93/71/7974 NEGATIVE  NEGATIVE mg/dL Final   Nitrite 93/71/7974 NEGATIVE  NEGATIVE Final   Leukocytes,Ua 09/11/2023 NEGATIVE  NEGATIVE Final   Performed at [facility] Lab, [address] [place], [place] [address]   Sodium 09/11/2023 139  135 - 145 mmol/L Final   Potassium 09/11/2023 3.8  3.5 - 5.1 mmol/L Final   Chloride 09/11/2023 105  98 - 111 mmol/L Final   CO2 09/11/2023 20 (L)  22 - 32 mmol/L Final   Glucose, Bld 09/11/2023 92  70 - 99 mg/dL Final   Glucose reference range applies only to samples taken after fasting for at least 8 hours.   BUN 09/11/2023 9  6 - 20 mg/dL Final   Creatinine, Ser 09/11/2023 0.87  0.61 - 1.24 mg/dL Final   Calcium 93/71/7974 9.1  8.9 - 10.3 mg/dL Final   Total Protein 93/71/7974 6.8  6.5 - 8.1 g/dL Final   Albumin 93/71/7974 3.8  3.5 - 5.0 g/dL Final   AST 93/71/7974 37  15 - 41 U/L Final   ALT 09/11/2023 21  0 - 44 U/L Final   Alkaline Phosphatase 09/11/2023 52  38 - 126 U/L Final   Total Bilirubin 09/11/2023 0.3  0.0 - 1.2 mg/dL Final   GFR, Estimated 09/11/2023 >60  >60 mL/min Final   Comment: (NOTE) Calculated using the CKD-EPI Creatinine Equation (2021)    Anion gap 09/11/2023 14  5 - 15 Final   Performed at [facility] Lab, [address], [place], [place] [address]  Admission on 08/27/2023, Discharged on 08/27/2023  Component Date Value Ref Range Status   Specimen Source 08/27/2023 URINE, CLEAN CATCH   Final   Color, Urine 08/27/2023 COLORLESS (A)  YELLOW Final   APPearance 08/27/2023 CLEAR  CLEAR Final   Specific Gravity, Urine 08/27/2023 1.002 (L)  1.005 -  1.030 Final   pH 08/27/2023 5.0  5.0 - 8.0 Final   Glucose, UA 08/27/2023 NEGATIVE  NEGATIVE mg/dL Final   Hgb urine dipstick 08/27/2023 NEGATIVE  NEGATIVE Final   Bilirubin Urine 08/27/2023 NEGATIVE  NEGATIVE Final   Ketones, ur 08/27/2023 NEGATIVE  NEGATIVE mg/dL Final   Protein, ur 93/86/7974 NEGATIVE  NEGATIVE mg/dL Final   Nitrite 93/86/7974 NEGATIVE  NEGATIVE Final   Leukocytes,Ua 08/27/2023 NEGATIVE  NEGATIVE Final   RBC / HPF 08/27/2023 0-5  0 - 5 RBC/hpf Final   WBC, UA 08/27/2023 0-5  0 - 5 WBC/hpf Final   Comment:        Reflex urine culture not performed if WBC <=10, OR if Squamous epithelial cells >5. If Squamous epithelial cells >5 suggest recollection.    Bacteria, UA 08/27/2023 NONE SEEN  NONE SEEN Final   Squamous Epithelial / HPF 08/27/2023 0-5  0 - 5 /HPF  Final   Performed at [facility] Lab, [address] [place], [place] [address]   Neisseria Gonorrhea 08/27/2023 Negative   Final   Chlamydia 08/27/2023 Negative   Final   Comment 08/27/2023 Normal Reference Ranger Chlamydia - Negative   Final   Comment 08/27/2023 Normal Reference Range Neisseria Gonorrhea - Negative   Final  Admission on 08/11/2023, Discharged on 08/11/2023  Component Date Value Ref Range Status   Sodium 08/11/2023 139  135 - 145 mmol/L Final   Potassium 08/11/2023 4.2  3.5 - 5.1 mmol/L Final   Chloride 08/11/2023 107  98 - 111 mmol/L Final   CO2 08/11/2023 21 (L)  22 - 32 mmol/L Final   Glucose, Bld 08/11/2023 88  70 - 99 mg/dL Final   Glucose reference range applies only to samples taken after fasting for at least 8 hours.   BUN 08/11/2023 9  6 - 20 mg/dL Final   Creatinine, Ser 08/11/2023 0.84  0.61 - 1.24 mg/dL Final   Calcium 94/71/7974 8.7 (L)  8.9 - 10.3 mg/dL Final   Total Protein 94/71/7974 7.0  6.5 - 8.1 g/dL Final   Albumin 94/71/7974 3.9  3.5 - 5.0 g/dL Final   AST 94/71/7974 73 (H)  15 - 41 U/L Final   ALT 08/11/2023 41  0 - 44 U/L Final   Alkaline Phosphatase 08/11/2023 73   38 - 126 U/L Final   Total Bilirubin 08/11/2023 0.3  0.0 - 1.2 mg/dL Final   GFR, Estimated 08/11/2023 >60  >60 mL/min Final   Comment: (NOTE) Calculated using the CKD-EPI Creatinine Equation (2021)    Anion gap 08/11/2023 11  5 - 15 Final   Performed at [facility] Lab, [address], [place], [place] [address]   Alcohol, Ethyl (B) 08/11/2023 344 (HH)  <15 mg/dL Final   Comment: CRITICAL RESULT CALLED TO, READ BACK BY AND VERIFIED WITH KRISTA CHAPLIN,RN AT 1021 08/11/2023 BY ZBEECH. (NOTE) For medical purposes only. Performed at [facility] Lab, [address], [place], [place] [address]    Opiates 08/11/2023 NONE DETECTED  NONE DETECTED Final   Cocaine 08/11/2023 NONE DETECTED  NONE DETECTED Final   Benzodiazepines 08/11/2023 NONE DETECTED  NONE DETECTED Final   Amphetamines 08/11/2023 NONE DETECTED  NONE DETECTED Final   Tetrahydrocannabinol 08/11/2023 NONE DETECTED  NONE DETECTED Final   Barbiturates 08/11/2023 NONE DETECTED  NONE DETECTED Final   Comment: (NOTE) DRUG SCREEN FOR MEDICAL PURPOSES ONLY.  IF CONFIRMATION IS NEEDED FOR ANY PURPOSE, NOTIFY LAB WITHIN 5 DAYS.  LOWEST DETECTABLE LIMITS FOR URINE DRUG SCREEN Drug Class                     Cutoff (ng/mL) Amphetamine and metabolites    1000 Barbiturate and metabolites    200 Benzodiazepine                 200 Opiates and metabolites        300 Cocaine and metabolites        300 THC                            50 Performed at [facility] Lab, [address] [place], [place] [address]    WBC 08/11/2023 4.8  4.0 - 10.5 K/uL Final   RBC 08/11/2023 4.18 (L)  4.22 - 5.81 MIL/uL Final   Hemoglobin 08/11/2023 13.2  13.0 - 17.0 g/dL Final  HCT 08/11/2023 40.0  39.0 - 52.0 % Final   MCV 08/11/2023 95.7  80.0 - 100.0 fL Final   MCH 08/11/2023 31.6  26.0 - 34.0 pg Final   MCHC 08/11/2023 33.0  30.0 - 36.0 g/dL Final   RDW 94/71/7974 13.4  11.5 - 15.5 % Final   Platelets 08/11/2023 292  150 - 400 K/uL Final    nRBC 08/11/2023 0.0  0.0 - 0.2 % Final   Neutrophils Relative % 08/11/2023 41  % Final   Neutro Abs 08/11/2023 2.0  1.7 - 7.7 K/uL Final   Lymphocytes Relative 08/11/2023 50  % Final   Lymphs Abs 08/11/2023 2.4  0.7 - 4.0 K/uL Final   Monocytes Relative 08/11/2023 7  % Final   Monocytes Absolute 08/11/2023 0.3  0.1 - 1.0 K/uL Final   Eosinophils Relative 08/11/2023 1  % Final   Eosinophils Absolute 08/11/2023 0.0  0.0 - 0.5 K/uL Final   Basophils Relative 08/11/2023 1  % Final   Basophils Absolute 08/11/2023 0.1  0.0 - 0.1 K/uL Final   Immature Granulocytes 08/11/2023 0  % Final   Abs Immature Granulocytes 08/11/2023 0.01  0.00 - 0.07 K/uL Final   Performed at [facility] Lab, [address], [place], [place] [address]  Admission on 07/21/2023, Discharged on 07/21/2023  Component Date Value Ref Range Status   Lipase 07/21/2023 46  11 - 51 U/L Final   Performed at [facility] Lab, [address] [place], [place] [address]   Sodium 07/21/2023 131 (L)  135 - 145 mmol/L Final   Potassium 07/21/2023 4.7  3.5 - 5.1 mmol/L Final   HEMOLYSIS AT THIS LEVEL MAY AFFECT RESULT   Chloride 07/21/2023 98  98 - 111 mmol/L Final   CO2 07/21/2023 19 (L)  22 - 32 mmol/L Final   Glucose, Bld 07/21/2023 92  70 - 99 mg/dL Final   Glucose reference range applies only to samples taken after fasting for at least 8 hours.   BUN 07/21/2023 9  6 - 20 mg/dL Final   Creatinine, Ser 07/21/2023 0.90  0.61 - 1.24 mg/dL Final   Calcium 94/92/7974 9.1  8.9 - 10.3 mg/dL Final   Total Protein 94/92/7974 7.3  6.5 - 8.1 g/dL Final   Albumin 94/92/7974 4.0  3.5 - 5.0 g/dL Final   AST 94/92/7974 54 (H)  15 - 41 U/L Final   HEMOLYSIS AT THIS LEVEL MAY AFFECT RESULT   ALT 07/21/2023 33  0 - 44 U/L Final   HEMOLYSIS AT THIS LEVEL MAY AFFECT RESULT   Alkaline Phosphatase 07/21/2023 76  38 - 126 U/L Final   Total Bilirubin 07/21/2023 1.4 (H)  0.0 - 1.2 mg/dL Final   HEMOLYSIS AT THIS LEVEL MAY AFFECT RESULT   GFR,  Estimated 07/21/2023 >60  >60 mL/min Final   Comment: (NOTE) Calculated using the CKD-EPI Creatinine Equation (2021)    Anion gap 07/21/2023 14  5 - 15 Final   Performed at [facility] Lab, [address], [place], [place] [address]   WBC 07/21/2023 4.3  4.0 - 10.5 K/uL Final   RBC 07/21/2023 4.41  4.22 - 5.81 MIL/uL Final   Hemoglobin 07/21/2023 14.0  13.0 - 17.0 g/dL Final   HCT 94/92/7974 41.9  39.0 - 52.0 % Final   MCV 07/21/2023 95.0  80.0 - 100.0 fL Final   MCH 07/21/2023 31.7  26.0 - 34.0 pg Final   MCHC 07/21/2023 33.4  30.0 - 36.0 g/dL  Final   RDW 07/21/2023 12.7  11.5 - 15.5 % Final   Platelets 07/21/2023 234  150 - 400 K/uL Final   nRBC 07/21/2023 0.0  0.0 - 0.2 % Final   Performed at [facility] Lab, [address] [place], [place] [address]    Blood Alcohol level:  Lab Results  Component Value Date   ETH 297 (H) 09/21/2023   ETH 344 (HH) 08/11/2023    Metabolic Disorder Labs: Lab Results  Component Value Date   HGBA1C 5.2 09/21/2023   MPG 102.54 09/21/2023   No results found for: PROLACTIN Lab Results  Component Value Date   CHOL 240 (H) 09/21/2023   TRIG 97 09/21/2023   HDL 115 09/21/2023   CHOLHDL 2.1 09/21/2023   VLDL 19 09/21/2023   LDLCALC 106 (H) 09/21/2023    Therapeutic Lab Levels: No results found for: LITHIUM No results found for: VALPROATE No results found for: CBMZ  Physical Findings   CAGE-AID    Flowsheet Row ED to Hosp-Admission (Discharged) from 08/11/[facility]  CAGE-AID Score 0   Flowsheet Row ED from 09/22/2023 in [facility] ED from 09/21/2023 in [facility] ED from 09/13/2023 in [facility] Emergency Department at [facility]  C-SSRS RISK CATEGORY No Risk No Risk No Risk     Musculoskeletal  Strength & Muscle Tone: within normal limits Gait & Station: normal Patient leans: N/A  Psychiatric Specialty  Exam  Presentation  General Appearance:  Appropriate for Environment  Eye Contact: Fair  Speech: Clear and Coherent  Speech Volume: Normal  Handedness:No data recorded  Mood and Affect  Mood: Depressed  Affect: Appropriate   Thought Process  Thought Processes: Goal Directed  Descriptions of Associations:Intact  Orientation:Full (Time, Place and Person)  Thought Content:Logical  Diagnosis of Schizophrenia or Schizoaffective disorder in past: No    Hallucinations:denies  Ideas of Reference:None  Suicidal Thoughts: denies  Homicidal Thoughts:denies   Sensorium  Memory: Immediate Fair  Judgment: Fair  Insight: Fair   Art therapist  Concentration: Fair  Attention Span: Fair  Recall: [employer] of Knowledge: Fair  Language: Fair   Psychomotor Activity  Psychomotor Activity: No data recorded   Assets  Assets: Communication Skills; Desire for Improvement   Sleep  Sleep: No data recorded  Estimated Sleeping Duration (Last 24 Hours): 8.00-9.50 hours  No data recorded  Physical Exam     Physical exam:  General: Well developed, well nourished, [place] male Pupils: Normal at 3mm Respiratory: Breathing is unlabored.  Cardiovascular: No edema.  Language: No anomia, no aphasia Muscle strength and tone-pt moving all extremities.  Gait not assessed as pt remained in bed.  Neuro: Facial muscles are symmetric. Pt without tremor, no evidence of hyperarousal.    Review of Systems  Constitutional:  Positive for fatigue.  HENT: Negative.    Eyes: Negative.   Respiratory: Negative.    Cardiovascular: Negative.   Gastrointestinal: Negative.   Genitourinary: Negative.   Musculoskeletal:  Positive for joint pain.  Skin: Negative.   Neurological: Negative.   Endo/Heme/Allergies: Negative.   Psychiatric/Behavioral:  Positive for depression. Negative for hallucinations and paranoia. The patient is nervous/anxious and  has insomnia.   Blood pressure 111/79, pulse 80, temperature 98.2 F (36.8 C), temperature source Oral, resp. rate 18, SpO2 100%. There is no height or weight on file to calculate BMI.  Treatment Plan Summary: Daily contact with patient to  assess and evaluate symptoms and progress in treatment and Medication management  43 y.o. male with a history of cocaine abuse, alcohol abuse, and depression, anxiety who was brought to  [facility] by Patent examiner. No information was provided by law enforcement prior to them leaving the facility. Patient denies history of psychiatric admissions or suicide attempts. Denies SI or HI today but presented to detox from alcohol and cocaine and start a medication for depression, anxiety and chronic pain. Symptoms most consistent with MDD and GAD based on patient history however cannot rule out substance induced mood disorder. Discussed r/b/a of starting Cymbalta  and gabapentin  and patient is agreeable.   7/10: reports improving mood, sleep and appetite. Denies SI, HI or AVH. Denies side effects from medications. Withdrawls are well controlled with CIWA <5. Discussed r/b/a of starting naltrexone  50 mg qdaily and patient would like to try.  7/11: finishing librium  taper. Chronic pain asking for increase of gabapentin . Denies SI, HI or AVH. Also agreeable to increase of Cymbalta  tomorrow morning.  7/12: finishing librium  taper with plan for inpatient rehab versus sober living. Denies SI, HI or AVH. Tolerating increase of Cymbalta  and requesting increase of gabapentin  for neuropathy   #alcohol use disorder, severe with withdrawal -continue Librium  taper + CIWA protocol with PRN Librium  -increase gabapentin  to 400 mg TID for alcohol withdrawal, anxiety, and pain -increase trazodone  100 mg at bedtime for insomnia -hydroxyzine  25 mg TID-PRN for anxiety -- cont naltrexone  50 mg qdaily   #(MDD, severe, recurrent without psychotic features,  rule out substance induced mood disorder -patient has not had any extended period of sobriety recently but state he was depressed every day when sober in jail for 90 days last years - stopped Lexapro  5 mg qdaily, discussed r/b/a for off label use with patient -cont Cymbalta  to 60 mg qdaily for neuropathy, depression, anxiety    #GAD -cont Cymbalta  to 60 mg qdaily for neuropathy, depression, anxiety  -increase trazodone  to 100 mg qhs for insomnia -hydroxyzine  25 mg TID-PRN for anxiety   Dispo: sober living or inpatient rehab, DC Monday to [facility] Cheyanne Lamison, MD 09/25/2023 1:11 PM

## 2023-09-25 NOTE — ED Notes (Signed)
 Patient calm and cooperative. Patient currently sleeping and resting in bed. Pt observed/assessed in patient room. RR even and unlabored, appearing in no noted distress. Environmental check complete

## 2023-09-25 NOTE — Group Note (Signed)
 Group Topic: Overcoming Obstacles  Group Date: 09/25/2023 Start Time: 1100 End Time: 1145 Facilitators: Alyse Leilani LABOR, NT  Department: [facility]  Number of Participants: 11  Group Focus: activities of daily living skills, chemical dependency education, and chemical dependency issues Treatment Modality:  Individual Therapy and Skills Training Interventions utilized were group exercise Purpose: enhance coping skills, increase insight, and relapse prevention strategies  Name: Jake Silva Date of Birth: 1981/02/26  MR: [id number]    Level of Participation: active Quality of Participation: attentive and cooperative Interactions with others: gave feedback Mood/Affect: positive Triggers (if applicable): none Cognition: coherent/clear and insightful Progress: Gaining insight Response: full attention  Plan: patient will be encouraged to attend groups   Patients Problems:  Patient Active Problem List   Diagnosis Date Noted   Alcohol use disorder, severe, dependence (HCC) 09/22/2023   Critical polytrauma 08/21/2020   Trauma 08/11/2020   Alcohol abuse 01/23/2014

## 2023-09-25 NOTE — ED Notes (Signed)
 The patient is in the dayroom, watching television, and socializing with other pts. No distress noted. Environment is secured. Plan of care ongoing, no further concerns as of present. Patient expresses no other needs at this time.

## 2023-09-25 NOTE — ED Notes (Addendum)
 Patient A&Ox4. Denies intent to harm self/others when asked. Denies A/VH. Patient denies any physical complaints when asked. No acute distress noted. Support and encouragement provided. Routine safety checks conducted according to facility protocol. Encouraged patient to notify staff if thoughts of harm toward self or others arise. Patient verbalize understanding and agreement. Will continue to monitor for safety.

## 2023-09-25 NOTE — ED Notes (Signed)
 The patient is sitting in the dayroom, watching television, and socializing with other pts. No distress noted. Environment is secured. Plan of care ongoing, no further concerns as of present. Patient expresses no other needs at this time.

## 2023-09-25 NOTE — Group Note (Signed)
 Group Topic: Relapse and Recovery  Group Date: 09/25/2023 Start Time: 2000 End Time: 2100 Facilitators: Joan Plowman B  Department: [facility]  Number of Participants: 7  Group Focus: abuse issues, coping skills, and daily focus Treatment Modality:  Leisure Development Interventions utilized were leisure development Purpose: enhance coping skills, express feelings, increase insight, and relapse prevention strategies  Name: CHAYSEN TILLMAN Date of Birth: [birth year]  MR: [id number]    Level of Participation: active Quality of Participation: attentive and cooperative Interactions with others: gave feedback Mood/Affect: appropriate Triggers (if applicable): NA Cognition: coherent/clear Progress: Gaining insight Response: NA Plan: patient will be encouraged to keep going to groups.   Patients Problems:  Patient Active Problem List   Diagnosis Date Noted   Alcohol use disorder, severe, dependence (HCC) 09/22/2023   Critical polytrauma 08/21/2020   Trauma 08/11/2020   Alcohol abuse 01/23/2014

## 2023-09-25 NOTE — ED Notes (Signed)
 Patient resting quietly in bed with eyes closed with unlabored breathing. Q 15 minute safety checks remain in place.  Pt remains safe on the unit at this time.

## 2023-09-25 NOTE — ED Notes (Signed)
 Patient calm and cooperative during initial nurse shift assessment. Pt observed/assessed in room preparing for bed . RR even and unlabored, appearing in no noted distress. Environmental check complete.

## 2023-09-26 DIAGNOSIS — F1420 Cocaine dependence, uncomplicated: Secondary | ICD-10-CM | POA: Diagnosis not present

## 2023-09-26 DIAGNOSIS — F32A Depression, unspecified: Secondary | ICD-10-CM | POA: Diagnosis not present

## 2023-09-26 DIAGNOSIS — F1024 Alcohol dependence with alcohol-induced mood disorder: Secondary | ICD-10-CM | POA: Diagnosis not present

## 2023-09-26 DIAGNOSIS — F10230 Alcohol dependence with withdrawal, uncomplicated: Secondary | ICD-10-CM | POA: Diagnosis not present

## 2023-09-26 MED ORDER — NALTREXONE HCL 50 MG PO TABS: 50.0000 mg | ORAL_TABLET | Freq: Every day | ORAL | 0 refills | Status: AC

## 2023-09-26 MED ORDER — DULOXETINE HCL 60 MG PO CPEP: 60.0000 mg | ORAL_CAPSULE | Freq: Every day | ORAL | 0 refills | Status: AC

## 2023-09-26 MED ORDER — ADULT MULTIVITAMIN W/MINERALS CH: 1.0000 | ORAL_TABLET | Freq: Every day | ORAL | Status: AC

## 2023-09-26 MED ORDER — GABAPENTIN 400 MG PO CAPS: 400.0000 mg | ORAL_CAPSULE | Freq: Three times a day (TID) | ORAL | 0 refills | Status: AC

## 2023-09-26 MED ORDER — HYDROXYZINE HCL 25 MG PO TABS: 25.0000 mg | ORAL_TABLET | Freq: Three times a day (TID) | ORAL | 0 refills | Status: AC | PRN

## 2023-09-26 MED ORDER — NICOTINE 14 MG/24HR TD PT24: 14.0000 mg | MEDICATED_PATCH | Freq: Every day | TRANSDERMAL | 0 refills | Status: AC

## 2023-09-26 MED ORDER — NAPHAZOLINE-GLYCERIN 0.012-0.25 % OP SOLN: 1.0000 [drp] | Freq: Two times a day (BID) | OPHTHALMIC | 0 refills | Status: AC | PRN

## 2023-09-26 MED ORDER — TRAZODONE HCL 100 MG PO TABS
100.0000 mg | ORAL_TABLET | Freq: Every evening | ORAL | 0 refills | Status: AC | PRN
Start: 2023-09-26 — End: ?

## 2023-09-26 NOTE — ED Notes (Signed)
 Pt sitting in dayroom watching television and interacting with peers. No acute distress noted. No concerns voiced. Informed pt to notify staff with any needs or assistance. Pt verbalized understanding and agreement. Will continue to monitor for safety.

## 2023-09-26 NOTE — Group Note (Signed)
 Group Topic: Decisional Balance/Substance Abuse  Group Date: 09/26/2023 Start Time: 1000 End Time: 1100 Facilitators: Alyse Leilani LABOR, NT  Department: [facility]  Number of Participants: 9  Group Focus: self-awareness, self-esteem, and social skills Treatment Modality:  Individual Therapy Interventions utilized were clarification Purpose: improve communication skills, regain self-worth, and reinforce self-care  Name: Jake Silva Date of Birth: 1980-12-03  MR: [id number]    Level of Participation: active Quality of Participation: cooperative and engaged Interactions with others: gave feedback Mood/Affect: brightens with interaction and positive Triggers (if applicable): none Cognition: insightful Progress: Gaining insight Response: great group Plan: patient will be encouraged to attend group  Patients Problems:  Patient Active Problem List   Diagnosis Date Noted   Alcohol use disorder, severe, dependence (HCC) 09/22/2023   Critical polytrauma 08/21/2020   Trauma 08/11/2020   Alcohol abuse 01/23/2014

## 2023-09-26 NOTE — ED Provider Notes (Signed)
 Behavioral Health Progress Note  Date and Time: 09/26/2023 11:31 AM Name: Jake Silva MRN:  [id number]  Subjective:   Patient reports improving mood and appetite. Reports mood is better and patient visible on the unit interacting with peers. He is future oriented on discharge to [place] family services at 1 PM tomorrow. Requesting scripts be sent to [facility] pharmacy and I sent electronicall. Denies feeling hopeless or wanting to be dead and patient is future oriented on sobriety. Patient reports chronic hip pain from pelvic fracture. Tolerating Cymbalta  and Gabapentin  increases and denies any side effects; reports medications are helpful..  Patient denies significant withdrawal.  Denies SI, HI or AVH. Denies wanting to be dead. 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  Additional Social History:                         Sleep: Fair  Appetite:  Fair  Current Medications:  Current Facility-Administered Medications  Medication Dose Route Frequency Provider Last Rate Last Admin   acetaminophen  (TYLENOL ) tablet 650 mg  650 mg Oral Q6H Xaviar Lunn, MD   650 mg at 09/26/23 0534   alum & mag hydroxide-simeth (MAALOX/MYLANTA) 200-200-20 MG/5ML suspension 30 mL  30 mL Oral Q4H PRN Rollene Katz, MD       haloperidol  (HALDOL ) tablet 5 mg  5 mg Oral TID PRN Rollene Katz, MD        And   diphenhydrAMINE  (BENADRYL ) capsule 50 mg  50 mg Oral TID PRN Rollene Katz, MD       haloperidol  lactate (HALDOL ) injection 5 mg  5 mg Intramuscular TID PRN Rollene Katz, MD       And   diphenhydrAMINE  (BENADRYL ) injection 50 mg  50 mg Intramuscular TID PRN Rollene Katz, MD       And   LORazepam  (ATIVAN ) injection 2 mg  2 mg Intramuscular TID PRN Rollene Katz, MD       haloperidol  lactate (HALDOL ) injection 10 mg  10 mg Intramuscular TID PRN Rollene Katz, MD       And   diphenhydrAMINE  (BENADRYL ) injection 50 mg  50 mg Intramuscular TID PRN Rollene Katz, MD       And   LORazepam  (ATIVAN ) injection 2 mg  2 mg Intramuscular TID PRN Rollene Katz, MD       DULoxetine  (CYMBALTA ) DR capsule 60 mg  60 mg Oral Daily Maylea Soria, MD   60 mg at 09/26/23 9047   gabapentin  (NEURONTIN ) capsule 400 mg  400 mg Oral TID Tallie Dodds, MD   400 mg at 09/26/23 9047   hydrOXYzine  (ATARAX ) tablet 25 mg  25 mg Oral TID PRN Rollene Katz, MD       magnesium  hydroxide (MILK OF MAGNESIA) suspension 30 mL  30 mL Oral Daily PRN Rollene Katz, MD       multivitamin with minerals tablet 1 tablet  1 tablet Oral Daily  Rollene Katz, MD   1 tablet at 09/26/23 9047   naltrexone  (DEPADE) tablet 50 mg  50 mg Oral Daily Alantra Popoca, MD   50 mg at 09/26/23 9047   naphazoline-glycerin  (CLEAR EYES REDNESS) ophth solution 1-2 drop  1-2 drop Both Eyes BID PRN Bobbitt, Shalon E, NP   2 drop at 09/26/23 0953   nicotine  (NICODERM CQ  - dosed in mg/24 hours) patch 14 mg  14 mg Transdermal Q0600 Rollene Katz, MD   14 mg at 09/26/23 0534   traZODone  (DESYREL ) tablet 100 mg  100 mg Oral QHS Ennifer Harston, MD   100 mg at 09/25/23 2113   traZODone  (DESYREL ) tablet 50 mg  50 mg Oral QHS PRN Rollene Katz, MD   50 mg at 09/23/23 2225   Current Outpatient Medications  Medication Sig Dispense Refill   [START ON 09/27/2023] DULoxetine  (CYMBALTA ) 60 MG capsule Take 1  capsule (60 mg total) by mouth daily. 30 capsule 0   gabapentin  (NEURONTIN ) 400 MG capsule Take 1 capsule (400 mg total) by mouth 3 (three) times daily. 90 capsule 0   hydrOXYzine  (ATARAX ) 25 MG tablet Take 1 tablet (25 mg total) by mouth 3 (three) times daily as needed for anxiety. 30 tablet 0   [START ON 09/27/2023] Multiple Vitamin (MULTIVITAMIN WITH MINERALS) TABS tablet Take 1 tablet by mouth daily.     [START ON 09/27/2023] naltrexone  (DEPADE) 50 MG tablet Take 1 tablet (50 mg total) by mouth daily. 30 tablet 0   naphazoline-glycerin  (CLEAR EYES REDNESS) 0.012-0.25 % SOLN Place 1-2 drops into both eyes 2 (two) times daily as needed for eye irritation. 30 mL 0   [START ON 09/27/2023] nicotine  (NICODERM CQ  - DOSED IN MG/24 HOURS) 14 mg/24hr patch Place 1 patch (14 mg total) onto the skin daily at 6 (six) AM. 28 patch 0   traZODone  (DESYREL ) 100 MG tablet Take 1 tablet (100 mg total) by mouth at bedtime as needed for sleep. 30 tablet 0    Labs  Lab Results:  Admission on 09/21/2023, Discharged on 09/22/2023  Component Date Value Ref Range Status   WBC 09/21/2023 3.8 (L)  4.0 - 10.5 K/uL Final   RBC 09/21/2023 4.30  4.22 - 5.81 MIL/uL Final   Hemoglobin 09/21/2023 13.6  13.0 - 17.0 g/dL Final   HCT 92/91/7974 39.7  39.0 - 52.0 % Final   MCV 09/21/2023 92.3  80.0 - 100.0 fL Final   MCH 09/21/2023 31.6  26.0 - 34.0 pg Final   MCHC 09/21/2023 34.3  30.0 - 36.0 g/dL Final   RDW 92/91/7974 13.2  11.5 - 15.5 % Final   Platelets 09/21/2023 289  150 - 400 K/uL Final   nRBC 09/21/2023 0.0  0.0 - 0.2 % Final   Neutrophils Relative % 09/21/2023 42  % Final   Neutro Abs 09/21/2023 1.6 (L)  1.7 - 7.7 K/uL Final   Lymphocytes Relative 09/21/2023 47  % Final   Lymphs Abs 09/21/2023 1.8  0.7 - 4.0 K/uL Final   Monocytes Relative 09/21/2023 9  % Final   Monocytes Absolute 09/21/2023 0.3  0.1 - 1.0 K/uL Final   Eosinophils Relative 09/21/2023 1  % Final   Eosinophils Absolute 09/21/2023 0.0  0.0 - 0.5  K/uL Final   Basophils Relative 09/21/2023 1  % Final   Basophils Absolute 09/21/2023 0.1  0.0 - 0.1 K/uL Final   Immature Granulocytes 09/21/2023 0  % Final   Abs Immature Granulocytes 09/21/2023 0.01  0.00 - 0.07  K/uL Final   Performed at [facility], [address], [place], [place] [address]   Sodium 09/21/2023 140  135 - 145 mmol/L Final   Potassium 09/21/2023 4.3  3.5 - 5.1 mmol/L Final   Chloride 09/21/2023 103  98 - 111 mmol/L Final   CO2 09/21/2023 26  22 - 32 mmol/L Final   Glucose, Bld 09/21/2023 96  70 - 99 mg/dL Final   Glucose reference range applies only to samples taken after fasting for at least 8 hours.   BUN 09/21/2023 8  6 - 20 mg/dL Final   Creatinine, Ser 09/21/2023 0.83  0.61 - 1.24 mg/dL Final   Calcium 92/91/7974 9.2  8.9 - 10.3 mg/dL Final   Total Protein 92/91/7974 7.7  6.5 - 8.1 g/dL Final   Albumin 92/91/7974 4.3  3.5 - 5.0 g/dL Final   AST 92/91/7974 37  15 - 41 U/L Final   ALT 09/21/2023 26  0 - 44 U/L Final   Alkaline Phosphatase 09/21/2023 63  38 - 126 U/L Final   Total Bilirubin 09/21/2023 0.4  0.0 - 1.2 mg/dL Final   GFR, Estimated 09/21/2023 >60  >60 mL/min Final   Comment: (NOTE) Calculated using the CKD-EPI Creatinine Equation (2021)    Anion gap 09/21/2023 11  5 - 15 Final   Performed at [facility], [address], [place], [place] [address]   Hgb A1c MFr Bld 09/21/2023 5.2  4.8 - 5.6 % Final   Comment: (NOTE) Diagnosis of Diabetes The following HbA1c ranges recommended by the American Diabetes Association (ADA) may be used as an aid in the diagnosis of diabetes mellitus.  Hemoglobin             Suggested A1C NGSP%              Diagnosis  <5.7                   Non Diabetic  5.7-6.4                Pre-Diabetic  >6.4                   Diabetic  <7.0                   Glycemic control for                       adults with diabetes.     Mean Plasma Glucose 09/21/2023 102.54  mg/dL Final   Performed at [facility], [address] [place], [place] [address]   Alcohol, Ethyl (B) 09/21/2023 297 (H)  <15 mg/dL Final   Comment: (NOTE) For medical purposes only. Performed at [facility], [address], [place], [place] [address]    Cholesterol 09/21/2023 240 (H)  0 - 200 mg/dL Final   Triglycerides 92/91/7974 97  <150 mg/dL Final   HDL 92/91/7974 115  >40 mg/dL Final   Total CHOL/HDL Ratio 09/21/2023 2.1  RATIO Final   VLDL 09/21/2023 19  0 - 40 mg/dL Final   LDL Cholesterol 09/21/2023 106 (H)  0 - 99 mg/dL Final   Comment:        Total Cholesterol/HDL:CHD Risk Coronary Heart Disease Risk Table                     Men   Women  1/2 Average Risk   3.4  3.3  Average Risk       5.0   4.4  2 X Average Risk   9.6   7.1  3 X Average Risk  23.4   11.0        Use the calculated Patient Ratio above and the CHD Risk Table to determine the patient's CHD Risk.        ATP III CLASSIFICATION (LDL):  <100     mg/dL   Optimal  899-870  mg/dL   Near or Above                    Optimal  130-159  mg/dL   Borderline  839-810  mg/dL   High  >809     mg/dL   Very High Performed at [facility] Lab, [address], [place], [place] [address]    TSH 09/21/2023 1.505  0.350 - 4.500 uIU/mL Final   Comment: Performed by a 3rd Generation assay with a functional sensitivity of <=0.01 uIU/mL. Performed at [facility], [address] [place], [place] [address]    Color, Urine 09/21/2023 YELLOW  YELLOW Final   APPearance 09/21/2023 CLEAR  CLEAR Final   Specific Gravity, Urine 09/21/2023 1.010  1.005 - 1.030 Final   pH 09/21/2023 5.0  5.0 - 8.0 Final   Glucose, UA 09/21/2023 NEGATIVE  NEGATIVE mg/dL Final   Hgb urine dipstick 09/21/2023 NEGATIVE  NEGATIVE Final   Bilirubin Urine 09/21/2023 NEGATIVE  NEGATIVE Final   Ketones, ur 09/21/2023 NEGATIVE  NEGATIVE mg/dL Final   Protein, ur 92/91/7974 NEGATIVE  NEGATIVE mg/dL Final   Nitrite 92/91/7974 NEGATIVE  NEGATIVE Final   Leukocytes,Ua  09/21/2023 NEGATIVE  NEGATIVE Final   Performed at [facility] Lab, [address], [place], [place] [address]   POC Amphetamine UR 09/21/2023 None Detected  NONE DETECTED (Cut Off Level 1000 ng/mL) Final   POC Secobarbital (BAR) 09/21/2023 None Detected  NONE DETECTED (Cut Off Level 300 ng/mL) Final   POC Buprenorphine (BUP) 09/21/2023 None Detected  NONE DETECTED (Cut Off Level 10 ng/mL) Final   POC Oxazepam (BZO) 09/21/2023 None Detected  NONE DETECTED (Cut Off Level 300 ng/mL) Final   POC Cocaine UR 09/21/2023 Positive (A)  NONE DETECTED (Cut Off Level 300 ng/mL) Final   POC Methamphetamine UR 09/21/2023 None Detected  NONE DETECTED (Cut Off Level 1000 ng/mL) Final   POC Morphine  09/21/2023 None Detected  NONE DETECTED (Cut Off Level 300 ng/mL) Final   POC Methadone UR 09/21/2023 None Detected  NONE DETECTED (Cut Off Level 300 ng/mL) Final   POC Oxycodone  UR 09/21/2023 None Detected  NONE DETECTED (Cut Off Level 100 ng/mL) Final   POC Marijuana UR 09/21/2023 None Detected  NONE DETECTED (Cut Off Level 50 ng/mL) Final   Vit D, 25-Hydroxy 09/21/2023 14.75 (L)  30 - 100 ng/mL Final   Comment: (NOTE) Vitamin D  deficiency has been defined by the Institute of Medicine  and an Endocrine Society practice guideline as a level of serum 25-OH  vitamin D  less than 20 ng/mL (1,2). The Endocrine Society went on to  further define vitamin D  insufficiency as a level between 21 and 29  ng/mL (2).  1. IOM (Institute of Medicine). 2010. Dietary reference intakes for  calcium and D. [place]: [employer]. 2. Holick MF, Binkley [place], Bischoff-Ferrari HA, et al. Evaluation,  treatment, and prevention of vitamin D  deficiency: an Endocrine  Society clinical practice guideline, JCEM. 2011 Jul; 96(7):  1911-30.  Performed at [facility], [address], [place], [place] [address]    Vitamin B-12 09/21/2023 290  180 - 914 pg/mL Final   Comment: (NOTE) This assay is not validated  for testing neonatal or myeloproliferative syndrome specimens for Vitamin B12 levels. Performed at [facility], [address], [place], [place] [address]   Admission on 09/11/2023, Discharged on 09/11/2023  Component Date Value Ref Range Status   WBC 09/11/2023 5.1  4.0 - 10.5 K/uL Final   RBC 09/11/2023 3.88 (L)  4.22 - 5.81 MIL/uL Final   Hemoglobin 09/11/2023 12.5 (L)  13.0 - 17.0 g/dL Final   HCT 93/71/7974 36.2 (L)  39.0 - 52.0 % Final   MCV 09/11/2023 93.3  80.0 - 100.0 fL Final   MCH 09/11/2023 32.2  26.0 - 34.0 pg Final   MCHC 09/11/2023 34.5  30.0 - 36.0 g/dL Final   RDW 93/71/7974 13.0  11.5 - 15.5 % Final   Platelets 09/11/2023 229  150 - 400 K/uL Final   nRBC 09/11/2023 0.0  0.0 - 0.2 % Final   Performed at [facility], [address], [place], [place] [address]   Color, Urine 09/11/2023 YELLOW  YELLOW Final   APPearance 09/11/2023 CLEAR  CLEAR Final   Specific Gravity, Urine 09/11/2023 1.029  1.005 - 1.030 Final   pH 09/11/2023 5.0  5.0 - 8.0 Final   Glucose, UA 09/11/2023 NEGATIVE  NEGATIVE mg/dL Final   Hgb urine dipstick 09/11/2023 NEGATIVE  NEGATIVE Final   Bilirubin Urine 09/11/2023 NEGATIVE  NEGATIVE Final   Ketones, ur 09/11/2023 NEGATIVE  NEGATIVE mg/dL Final   Protein, ur 93/71/7974 NEGATIVE  NEGATIVE mg/dL Final   Nitrite 93/71/7974 NEGATIVE  NEGATIVE Final   Leukocytes,Ua 09/11/2023 NEGATIVE  NEGATIVE Final   Performed at [facility], [address], [place], [place] [address]   Sodium 09/11/2023 139  135 - 145 mmol/L Final   Potassium 09/11/2023 3.8  3.5 - 5.1 mmol/L Final   Chloride 09/11/2023 105  98 - 111 mmol/L Final   CO2 09/11/2023 20 (L)  22 - 32 mmol/L Final   Glucose, Bld 09/11/2023 92  70 - 99 mg/dL Final   Glucose reference range applies only to samples taken after fasting for at least 8 hours.   BUN 09/11/2023 9  6 - 20 mg/dL Final   Creatinine, Ser 09/11/2023 0.87  0.61 - 1.24 mg/dL Final   Calcium 93/71/7974 9.1  8.9 -  10.3 mg/dL Final   Total Protein 93/71/7974 6.8  6.5 - 8.1 g/dL Final   Albumin 93/71/7974 3.8  3.5 - 5.0 g/dL Final   AST 93/71/7974 37  15 - 41 U/L Final   ALT 09/11/2023 21  0 - 44 U/L Final   Alkaline Phosphatase 09/11/2023 52  38 - 126 U/L Final   Total Bilirubin 09/11/2023 0.3  0.0 - 1.2 mg/dL Final   GFR, Estimated 09/11/2023 >60  >60 mL/min Final   Comment: (NOTE) Calculated using the CKD-EPI Creatinine Equation (2021)    Anion gap 09/11/2023 14  5 - 15 Final   Performed at [facility], [address] [place], [place] [address]  Admission on 08/27/2023, Discharged on 08/27/2023  Component Date Value Ref Range Status   Specimen Source 08/27/2023 URINE, CLEAN CATCH   Final   Color, Urine 08/27/2023 COLORLESS (A)  YELLOW Final   APPearance 08/27/2023 CLEAR  CLEAR Final   Specific Gravity, Urine 08/27/2023 1.002 (  L)  1.005 - 1.030 Final   pH 08/27/2023 5.0  5.0 - 8.0 Final   Glucose, UA 08/27/2023 NEGATIVE  NEGATIVE mg/dL Final   Hgb urine dipstick 08/27/2023 NEGATIVE  NEGATIVE Final   Bilirubin Urine 08/27/2023 NEGATIVE  NEGATIVE Final   Ketones, ur 08/27/2023 NEGATIVE  NEGATIVE mg/dL Final   Protein, ur 93/86/7974 NEGATIVE  NEGATIVE mg/dL Final   Nitrite 93/86/7974 NEGATIVE  NEGATIVE Final   Leukocytes,Ua 08/27/2023 NEGATIVE  NEGATIVE Final   RBC / HPF 08/27/2023 0-5  0 - 5 RBC/hpf Final   WBC, UA 08/27/2023 0-5  0 - 5 WBC/hpf Final   Comment:        Reflex urine culture not performed if WBC <=10, OR if Squamous epithelial cells >5. If Squamous epithelial cells >5 suggest recollection.    Bacteria, UA 08/27/2023 NONE SEEN  NONE SEEN Final   Squamous Epithelial / HPF 08/27/2023 0-5  0 - 5 /HPF Final   Performed at [facility], [address], [place], [place] [address]   Neisseria Gonorrhea 08/27/2023 Negative   Final   Chlamydia 08/27/2023 Negative   Final   Comment 08/27/2023 Normal Reference Ranger Chlamydia - Negative   Final   Comment 08/27/2023  Normal Reference Range Neisseria Gonorrhea - Negative   Final  Admission on 08/11/2023, Discharged on 08/11/2023  Component Date Value Ref Range Status   Sodium 08/11/2023 139  135 - 145 mmol/L Final   Potassium 08/11/2023 4.2  3.5 - 5.1 mmol/L Final   Chloride 08/11/2023 107  98 - 111 mmol/L Final   CO2 08/11/2023 21 (L)  22 - 32 mmol/L Final   Glucose, Bld 08/11/2023 88  70 - 99 mg/dL Final   Glucose reference range applies only to samples taken after fasting for at least 8 hours.   BUN 08/11/2023 9  6 - 20 mg/dL Final   Creatinine, Ser 08/11/2023 0.84  0.61 - 1.24 mg/dL Final   Calcium 94/71/7974 8.7 (L)  8.9 - 10.3 mg/dL Final   Total Protein 94/71/7974 7.0  6.5 - 8.1 g/dL Final   Albumin 94/71/7974 3.9  3.5 - 5.0 g/dL Final   AST 94/71/7974 73 (H)  15 - 41 U/L Final   ALT 08/11/2023 41  0 - 44 U/L Final   Alkaline Phosphatase 08/11/2023 73  38 - 126 U/L Final   Total Bilirubin 08/11/2023 0.3  0.0 - 1.2 mg/dL Final   GFR, Estimated 08/11/2023 >60  >60 mL/min Final   Comment: (NOTE) Calculated using the CKD-EPI Creatinine Equation (2021)    Anion gap 08/11/2023 11  5 - 15 Final   Performed at [facility], [address], [place], [place] [address]   Alcohol, Ethyl (B) 08/11/2023 344 (HH)  <15 mg/dL Final   Comment: CRITICAL RESULT CALLED TO, READ BACK BY AND VERIFIED WITH KRISTA CHAPLIN,RN AT 1021 08/11/2023 BY ZBEECH. (NOTE) For medical purposes only. Performed at [facility], [address] [place], [place] [address]    Opiates 08/11/2023 NONE DETECTED  NONE DETECTED Final   Cocaine 08/11/2023 NONE DETECTED  NONE DETECTED Final   Benzodiazepines 08/11/2023 NONE DETECTED  NONE DETECTED Final   Amphetamines 08/11/2023 NONE DETECTED  NONE DETECTED Final   Tetrahydrocannabinol 08/11/2023 NONE DETECTED  NONE DETECTED Final   Barbiturates 08/11/2023 NONE DETECTED  NONE DETECTED Final   Comment: (NOTE) DRUG SCREEN FOR MEDICAL PURPOSES ONLY.  IF CONFIRMATION IS  NEEDED FOR ANY PURPOSE, NOTIFY LAB WITHIN 5 DAYS.  LOWEST DETECTABLE LIMITS FOR URINE DRUG SCREEN Drug Class                     Cutoff (ng/mL) Amphetamine and metabolites    1000 Barbiturate and metabolites    200 Benzodiazepine                 200 Opiates and metabolites        300 Cocaine and metabolites        300 THC                            50 Performed at [facility], [address] [place], [place] [address]    WBC 08/11/2023 4.8  4.0 - 10.5 K/uL Final   RBC 08/11/2023 4.18 (L)  4.22 - 5.81 MIL/uL Final   Hemoglobin 08/11/2023 13.2  13.0 - 17.0 g/dL Final   HCT 94/71/7974 40.0  39.0 - 52.0 % Final   MCV 08/11/2023 95.7  80.0 - 100.0 fL Final   MCH 08/11/2023 31.6  26.0 - 34.0 pg Final   MCHC 08/11/2023 33.0  30.0 - 36.0 g/dL Final   RDW 94/71/7974 13.4  11.5 - 15.5 % Final   Platelets 08/11/2023 292  150 - 400 K/uL Final   nRBC 08/11/2023 0.0  0.0 - 0.2 % Final   Neutrophils Relative % 08/11/2023 41  % Final   Neutro Abs 08/11/2023 2.0  1.7 - 7.7 K/uL Final   Lymphocytes Relative 08/11/2023 50  % Final   Lymphs Abs 08/11/2023 2.4  0.7 - 4.0 K/uL Final   Monocytes Relative 08/11/2023 7  % Final   Monocytes Absolute 08/11/2023 0.3  0.1 - 1.0 K/uL Final   Eosinophils Relative 08/11/2023 1  % Final   Eosinophils Absolute 08/11/2023 0.0  0.0 - 0.5 K/uL Final   Basophils Relative 08/11/2023 1  % Final   Basophils Absolute 08/11/2023 0.1  0.0 - 0.1 K/uL Final   Immature Granulocytes 08/11/2023 0  % Final   Abs Immature Granulocytes 08/11/2023 0.01  0.00 - 0.07 K/uL Final   Performed at [facility], [address] [place], [place] [address]  Admission on 07/21/2023, Discharged on 07/21/2023  Component Date Value Ref Range Status   Lipase 07/21/2023 46  11 - 51 U/L Final   Performed at [facility], [address], [place], [place] [address]   Sodium 07/21/2023 131 (L)  135 - 145 mmol/L Final   Potassium 07/21/2023 4.7  3.5 - 5.1 mmol/L Final    HEMOLYSIS AT THIS LEVEL MAY AFFECT RESULT   Chloride 07/21/2023 98  98 - 111 mmol/L Final   CO2 07/21/2023 19 (L)  22 - 32 mmol/L Final   Glucose, Bld 07/21/2023 92  70 - 99 mg/dL Final   Glucose reference range applies only to samples taken after fasting for at least 8 hours.   BUN 07/21/2023 9  6 - 20 mg/dL Final   Creatinine, Ser 07/21/2023 0.90  0.61 - 1.24 mg/dL Final   Calcium 94/92/7974 9.1  8.9 - 10.3 mg/dL Final   Total Protein 94/92/7974 7.3  6.5 - 8.1 g/dL Final   Albumin 94/92/7974 4.0  3.5 - 5.0 g/dL Final   AST 94/92/7974 54 (H)  15 - 41 U/L Final   HEMOLYSIS AT THIS LEVEL MAY AFFECT RESULT   ALT 07/21/2023 33  0 - 44 U/L Final   HEMOLYSIS AT  THIS LEVEL MAY AFFECT RESULT   Alkaline Phosphatase 07/21/2023 76  38 - 126 U/L Final   Total Bilirubin 07/21/2023 1.4 (H)  0.0 - 1.2 mg/dL Final   HEMOLYSIS AT THIS LEVEL MAY AFFECT RESULT   GFR, Estimated 07/21/2023 >60  >60 mL/min Final   Comment: (NOTE) Calculated using the CKD-EPI Creatinine Equation (2021)    Anion gap 07/21/2023 14  5 - 15 Final   Performed at [facility] Lab, [address], [place], [place] [address]   WBC 07/21/2023 4.3  4.0 - 10.5 K/uL Final   RBC 07/21/2023 4.41  4.22 - 5.81 MIL/uL Final   Hemoglobin 07/21/2023 14.0  13.0 - 17.0 g/dL Final   HCT 94/92/7974 41.9  39.0 - 52.0 % Final   MCV 07/21/2023 95.0  80.0 - 100.0 fL Final   MCH 07/21/2023 31.7  26.0 - 34.0 pg Final   MCHC 07/21/2023 33.4  30.0 - 36.0 g/dL Final   RDW 94/92/7974 12.7  11.5 - 15.5 % Final   Platelets 07/21/2023 234  150 - 400 K/uL Final   nRBC 07/21/2023 0.0  0.0 - 0.2 % Final   Performed at [facility], [address], [place], [place] [address]    Blood Alcohol level:  Lab Results  Component Value Date   ETH 297 (H) 09/21/2023   ETH 344 (HH) 08/11/2023    Metabolic Disorder Labs: Lab Results  Component Value Date   HGBA1C 5.2 09/21/2023   MPG 102.54 09/21/2023   No results found for: PROLACTIN Lab  Results  Component Value Date   CHOL 240 (H) 09/21/2023   TRIG 97 09/21/2023   HDL 115 09/21/2023   CHOLHDL 2.1 09/21/2023   VLDL 19 09/21/2023   LDLCALC 106 (H) 09/21/2023    Therapeutic Lab Levels: No results found for: LITHIUM No results found for: VALPROATE No results found for: CBMZ  Physical Findings   CAGE-AID    Flowsheet Row ED to Hosp-Admission (Discharged) from 08/11/2020 in [facility]  CAGE-AID Score 0   Flowsheet Row ED from 09/22/2023 in [facility] ED from 09/21/2023 in [facility] ED from 09/13/2023 in [facility] Emergency Department at [facility]  C-SSRS RISK CATEGORY No Risk No Risk No Risk     Musculoskeletal  Strength & Muscle Tone: within normal limits Gait & Station: normal Patient leans: N/A  Psychiatric Specialty Exam  Presentation  General Appearance:  Appropriate for Environment  Eye Contact: Fair  Speech: Clear and Coherent  Speech Volume: Normal  Handedness:No data recorded  Mood and Affect  Mood: Better  Affect: Appropriate Congruent  Thought Process  Thought Processes: Goal Directed  Descriptions of Associations:Intact  Orientation:Full (Time, Place and Person)  Thought Content:Logical  Diagnosis of Schizophrenia or Schizoaffective disorder in past: No    Hallucinations:denies  Ideas of Reference:None  Suicidal Thoughts: denies  Homicidal Thoughts:denies   Sensorium  Memory: Immediate Fair  Judgment: Fair  Insight: Fair   Art therapist  Concentration: Fair  Attention Span: Fair  Recall: [employer] of Knowledge: Fair  Language: Fair   Psychomotor Activity  Psychomotor Activity: No data recorded   Assets  Assets: Communication Skills; Desire for Improvement   Sleep  Sleep: No data recorded  Estimated Sleeping Duration (Last 24 Hours): 7.50-9.25 hours  No data  recorded  Physical Exam     Physical exam:  General: Well developed, well nourished, African [place]  male Pupils: Normal at 3mm Respiratory: Breathing is unlabored.  Cardiovascular: No edema.  Language: No anomia, no aphasia Muscle strength and tone-pt moving all extremities.  Gait not assessed as pt remained in bed.  Neuro: Facial muscles are symmetric. Pt without tremor, no evidence of hyperarousal.    Review of Systems  Constitutional:  Positive for fatigue.  HENT: Negative.    Eyes: Negative.   Respiratory: Negative.    Cardiovascular: Negative.   Gastrointestinal: Negative.   Genitourinary: Negative.   Musculoskeletal:  Positive for joint pain.  Skin: Negative.   Neurological: Negative.   Endo/Heme/Allergies: Negative.   Psychiatric/Behavioral:  Positive for depression. Negative for hallucinations and paranoia. The patient denies feeling nervous/anxious and denies  insomnia.   Blood pressure 108/78, pulse 73, temperature 97.7 F (36.5 C), temperature source Oral, resp. rate 17, SpO2 98%. There is no height or weight on file to calculate BMI.  Treatment Plan Summary: Daily contact with patient to assess and evaluate symptoms and progress in treatment and Medication management  43 y.o. male with a history of cocaine abuse, alcohol abuse, and depression, anxiety who was brought to  [facility] by Patent examiner. No information was provided by law enforcement prior to them leaving the facility. Patient denies history of psychiatric admissions or suicide attempts. Denies SI or HI today but presented to detox from alcohol and cocaine and start a medication for depression, anxiety and chronic pain. Symptoms most consistent with MDD and GAD based on patient history however cannot rule out substance induced mood disorder. Discussed r/b/a of starting Cymbalta  and gabapentin  and patient is agreeable.   7/10: reports improving mood, sleep and appetite.  Denies SI, HI or AVH. Denies side effects from medications. Withdrawls are well controlled with CIWA <5. Discussed r/b/a of starting naltrexone  50 mg qdaily and patient would like to try.  7/11: finishing librium  taper. Chronic pain asking for increase of gabapentin . Denies SI, HI or AVH. Also agreeable to increase of Cymbalta  tomorrow morning.  7/12: finishing librium  taper with plan for inpatient rehab versus sober living. Denies SI, HI or AVH. Tolerating increase of Cymbalta  and requesting increase of gabapentin  for neuropathy  7/13: patient is at baseline, tolerating Cymbalta  and Gabapentin . Plan for discharge tomorrow. Denies SI, HI or AVH. Low acute risk for suicide.   #alcohol use disorder, severe with withdrawal CIWA discontinued as <5 for multiple days -cont  gabapentin  to 400 mg TID for alcohol withdrawal, anxiety, and pain -cont trazodone  100 mg at bedtime for insomnia -hydroxyzine  25 mg TID-PRN for anxiety -- cont naltrexone  50 mg qdaily   #(MDD, severe, recurrent without psychotic features, rule out substance induced mood disorder -patient has not had any extended period of sobriety recently but state he was depressed every day when sober in jail for 90 days last years - stopped Lexapro  5 mg qdaily, discussed r/b/a for off label use with patient -cont Cymbalta  to 60 mg qdaily for neuropathy, depression, anxiety    #GAD -cont Cymbalta  to 60 mg qdaily for neuropathy, depression, anxiety  -iconttrazodone to 100 mg qhs for insomnia -hydroxyzine  25 mg TID-PRN for anxiety   Dispo: sober living or inpatient rehab, DC Monday to [facility] Hansen Carino, MD 09/26/2023 11:31 AM

## 2023-09-26 NOTE — ED Provider Notes (Signed)
Duplicate note please delete

## 2023-09-26 NOTE — Group Note (Signed)
 Group Topic: Communication  Group Date: 09/26/2023 Start Time: 0900 End Time: 1000 Facilitators: Herold Lajuana NOVAK, RN  Department: [facility]  Number of Participants: 9  Group Focus: daily focus Treatment Modality:  Individual Therapy Interventions utilized were patient education Purpose: increase insight  Name: Jake Silva Date of Birth: [birth year]  MR: [id number]    Level of Participation: active Quality of Participation: attentive Interactions with others: gave feedback Mood/Affect: appropriate Triggers (if applicable): none identified Cognition: coherent/clear Progress: Significant Response: pt verbalized understanding of all medication taken Plan: patient will be encouraged to remain compliant with medications  Patients Problems:  Patient Active Problem List   Diagnosis Date Noted   Alcohol use disorder, severe, dependence (HCC) 09/22/2023   Critical polytrauma 08/21/2020   Trauma 08/11/2020   Alcohol abuse 01/23/2014

## 2023-09-26 NOTE — ED Notes (Signed)
 Patient currently sleeping and resting in bed. Pt observed/assessed in patient room. RR even and unlabored, appearing in no noted distress. Environmental check complete

## 2023-09-26 NOTE — ED Notes (Signed)
 Patient A&Ox4. Denies intent/thoughts to harm self/others when asked. Denies A/VH. Patient reports Rt eye pain 5/10. No distress noted. Support and encouragement provided. Routine safety checks conducted according to facility protocol. Encouraged patient to notify staff if thoughts of harm toward self or others arise. Endorses safety. Patient verbalized understanding and agreement. Plan of care ongoing, no further concerns as of present. Patient expresses no other needs at this time.

## 2023-09-27 DIAGNOSIS — F411 Generalized anxiety disorder: Secondary | ICD-10-CM

## 2023-09-27 DIAGNOSIS — F10230 Alcohol dependence with withdrawal, uncomplicated: Secondary | ICD-10-CM | POA: Diagnosis not present

## 2023-09-27 DIAGNOSIS — F1423 Cocaine dependence with withdrawal: Secondary | ICD-10-CM

## 2023-09-27 DIAGNOSIS — F1420 Cocaine dependence, uncomplicated: Secondary | ICD-10-CM | POA: Diagnosis not present

## 2023-09-27 DIAGNOSIS — F332 Major depressive disorder, recurrent severe without psychotic features: Secondary | ICD-10-CM

## 2023-09-27 DIAGNOSIS — F32A Depression, unspecified: Secondary | ICD-10-CM | POA: Diagnosis not present

## 2023-09-27 DIAGNOSIS — F1024 Alcohol dependence with alcohol-induced mood disorder: Secondary | ICD-10-CM | POA: Diagnosis not present

## 2023-09-27 NOTE — ED Notes (Signed)
 Stable. A&O x 4.  Discharging and will f/u with family services [facility] today at 1300.  Medications were escribed to pt preferred pharmacy   Denies current SI plan and Intent.  Denies HI and A/V hallucinations.   All belongings returned to PT.  All discharge, f/u instructions reviewed with patient.  Medication education provided   Pt verbalized understanding

## 2023-09-27 NOTE — Group Note (Signed)
 Group Topic: Healthy Self Image and Positive Change  Group Date: 09/27/2023 Start Time: 1030 End Time: 1100 Facilitators: Lonzell Dwayne RAMAN, NT  Department: [facility]  Number of Participants: 7  Group Focus: daily focus Treatment Modality:  Patient-Centered Therapy Interventions utilized were patient education Purpose: increase insight  Name: Jake Silva Date of Birth: July 10, 1980  MR: [id number]    Level of Participation: Patient attended groupmoderate Quality of Participation: engaged Interactions with others: gave feedback Mood/Affect: positive Triggers (if applicable): Dealing with the wrong people Cognition: coherent/clear Progress: Significant Response: Appropriate  Plan: patient will be encouraged to be positive and work hard.  Patients Problems:  Patient Active Problem List   Diagnosis Date Noted   Cocaine dependence with withdrawal (HCC) 09/27/2023   MDD (major depressive disorder), recurrent severe, without psychosis (HCC) 09/27/2023   GAD (generalized anxiety disorder) 09/27/2023   Alcohol use disorder, severe, dependence (HCC) 09/22/2023   Critical polytrauma 08/21/2020   Trauma 08/11/2020   Alcohol abuse 01/23/2014

## 2023-09-27 NOTE — ED Provider Notes (Signed)
 FBC/OBS ASAP Discharge Summary  Date and Time: 09/27/2023 10:47 AM  Name: Jake Silva  MRN:  [id number]   Discharge Diagnoses:  Final diagnoses:  Alcohol dependence with uncomplicated withdrawal (HCC)  Cocaine dependence with withdrawal (HCC)  MDD (major depressive disorder), recurrent severe, without psychosis (HCC)  GAD (generalized anxiety disorder)    Subjective: Patient reported improved condition and is eager for discharge. He plans to followup with [facility] for outpatient substance treatment while living with sister. He believes medications are helpful and denies side effects. Sleep has gotten better.   Stay Summary: Patient was admitted to inpatient psychiatry at [facility] for safety and stabilization. Patient was provided safe and therapeutic milieu, psychiatric and medical assessment, care and treatment, as well as support from nursing, behavioral health staff. Both psychotherapy and psychoeducation groups were provided. Different coping skills such as journaling, CBT and art therapy groups were offered. Additional consultation was provided by hospitalist for H&P and medical needs.  Patient was started on librium  taper for alcohol withdrawal + CIWA Protocol. Patient was additionally started on natrexone and gabapentin  to assist with anxiety, neuropathy and alcohol withdrawal. Reported chronic pain and anxiety and depression and was started on Cymbalta  60 mg qdaily during the admission for MDD. Patient tolerated without side effects and medications were titrated to therapeutic effect. As patient stabilized on medications and participated in therapeutic interventions, symptoms began to improve. Patient had no symptoms of withdrawal and denied SI, HI or AVH for >72 hours prior to discharge.  On the day of discharge, the chart was reviewed, case was discussed with staff and the patient was seen in person. Patient's overall mood has improved. Patient was calm and  cooperative and did not appear anxious. Patient reported adequate sleep and stable mood. Patient was tolerating medications well without side effects reported or noted. Patient denied suicidal ideation, plan or intent, denied hopelessness, helplessness or worthlessness, and denied homicidal ideation. Insight and judgement have improved. Patient demonstrated future orientation and was motivated to follow-up with aftercare. Patient was encouraged to be adherent with medications. Patient was instructed to call 911, ask for help to go to the closest emergency room or crisis center, call crisis hotlines for help if in critical status or when symptoms were worsening. Patient voiced understanding of this information. At the time of discharge, patient had reached maximum benefit from hospitalization, was no longer considered to be dangerous to self or others, and was psychiatrically stable and otherwise appropriate for discharge to less restrictive care in the community.   Medical Hospital Course: Patient was seen by the hospitalist for routine admission examination. Medications for chronic conditions were continued.  Medical hospital course was otherwise unremarkable.    Time with Patient: I personally spent 40 minutes on the unit in direct patient care. The direct patient care time included face-to-face time with the patient, reviewing the patient's chart, communicating with other professionals, and coordinating care. Greater than 50% of this time was spent in counseling or coordinating care with the patient regarding goals of hospitalization, psycho-education, and discharge planning needs.  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  Tobacco Cessation:  A prescription for an FDA-approved tobacco  cessation medication provided at discharge  Current Medications:  Current Facility-Administered Medications  Medication Dose Route Frequency Provider Last Rate Last Admin   acetaminophen  (TYLENOL ) tablet 650 mg  650 mg Oral Q6H Zouev, Dmitri, MD   650 mg at 09/27/23 0559   alum & mag hydroxide-simeth (MAALOX/MYLANTA) 200-200-20 MG/5ML suspension 30 mL  30 mL Oral Q4H PRN Rollene Katz, MD       haloperidol  (HALDOL ) tablet 5 mg  5 mg Oral TID PRN Rollene Katz, MD       And   diphenhydrAMINE  (BENADRYL ) capsule 50 mg  50 mg Oral TID PRN Rollene Katz, MD       haloperidol  lactate (HALDOL ) injection 5 mg  5 mg Intramuscular TID PRN Rollene Katz, MD       And   diphenhydrAMINE  (BENADRYL ) injection 50 mg  50 mg Intramuscular TID PRN Rollene Katz, MD       And   LORazepam  (ATIVAN ) injection 2 mg  2 mg Intramuscular TID PRN Rollene Katz, MD       haloperidol  lactate (HALDOL ) injection 10 mg  10 mg Intramuscular TID PRN Rollene Katz, MD       And   diphenhydrAMINE  (BENADRYL ) injection 50 mg  50 mg Intramuscular TID PRN Rollene Katz, MD       And   LORazepam  (ATIVAN ) injection 2 mg  2 mg Intramuscular TID PRN Rollene Katz, MD       DULoxetine  (CYMBALTA ) DR capsule 60 mg  60 mg Oral Daily Zouev, Dmitri, MD   60 mg at 09/27/23 1021   gabapentin  (NEURONTIN ) capsule 400 mg  400 mg Oral TID Zouev, Dmitri, MD   400 mg at 09/27/23 1021   hydrOXYzine  (ATARAX ) tablet 25 mg  25 mg Oral TID PRN Rollene Katz, MD       magnesium  hydroxide (MILK OF MAGNESIA) suspension 30 mL  30 mL Oral Daily PRN Rollene Katz, MD       multivitamin with minerals tablet 1 tablet  1 tablet Oral Daily Rollene Katz, MD   1 tablet at 09/27/23 1021   naltrexone  (DEPADE) tablet 50 mg  50 mg Oral Daily Zouev, Dmitri, MD   50 mg at 09/27/23 1021   naphazoline-glycerin  (CLEAR  EYES REDNESS) ophth solution 1-2 drop  1-2 drop Both Eyes BID PRN Bobbitt, Shalon E, NP   2 drop at 09/26/23 0953   nicotine  (NICODERM CQ  - dosed in mg/24 hours) patch 14 mg  14 mg Transdermal Q0600 Rollene Katz, MD   14 mg at 09/27/23 0514   traZODone  (DESYREL ) tablet 100 mg  100 mg Oral QHS Zouev, Dmitri, MD   100 mg at 09/26/23 2252   traZODone  (DESYREL ) tablet 50 mg  50 mg Oral QHS PRN Rollene Katz, MD   50 mg at 09/23/23 2225   Current Outpatient Medications  Medication Sig Dispense Refill   DULoxetine  (CYMBALTA ) 60 MG capsule Take 1 capsule (60 mg total) by mouth daily. 30 capsule 0   gabapentin  (NEURONTIN ) 400 MG capsule Take 1 capsule (400 mg total) by mouth 3 (three) times daily. 90 capsule 0   hydrOXYzine  (ATARAX ) 25 MG tablet Take 1 tablet (25 mg total) by mouth 3 (three) times daily as needed for anxiety. 30 tablet 0   Multiple Vitamin (MULTIVITAMIN WITH MINERALS) TABS tablet Take 1 tablet by mouth daily.     naltrexone  (DEPADE) 50 MG tablet Take 1 tablet (50 mg total) by mouth daily. 30 tablet 0   naphazoline-glycerin  (CLEAR EYES REDNESS)  0.012-0.25 % SOLN Place 1-2 drops into both eyes 2 (two) times daily as needed for eye irritation. 30 mL 0   nicotine  (NICODERM CQ  - DOSED IN MG/24 HOURS) 14 mg/24hr patch Place 1 patch (14 mg total) onto the skin daily at 6 (six) AM. 28 patch 0   traZODone  (DESYREL ) 100 MG tablet Take 1 tablet (100 mg total) by mouth at bedtime as needed for sleep. 30 tablet 0    PTA Medications:  Facility Ordered Medications  Medication   [COMPLETED] chlordiazePOXIDE  (LIBRIUM ) capsule 25 mg   traZODone  (DESYREL ) tablet 50 mg   nicotine  (NICODERM CQ  - dosed in mg/24 hours) patch 14 mg   [EXPIRED] loperamide  (IMODIUM ) capsule 2-4 mg   [EXPIRED] ondansetron  (ZOFRAN -ODT) disintegrating tablet 4 mg   [COMPLETED] chlordiazePOXIDE  (LIBRIUM ) capsule 25 mg   Followed by   [COMPLETED] chlordiazePOXIDE  (LIBRIUM ) capsule 25 mg   Followed by   [COMPLETED]  chlordiazePOXIDE  (LIBRIUM ) capsule 25 mg   hydrOXYzine  (ATARAX ) tablet 25 mg   alum & mag hydroxide-simeth (MAALOX/MYLANTA) 200-200-20 MG/5ML suspension 30 mL   haloperidol  (HALDOL ) tablet 5 mg   And   diphenhydrAMINE  (BENADRYL ) capsule 50 mg   haloperidol  lactate (HALDOL ) injection 5 mg   And   diphenhydrAMINE  (BENADRYL ) injection 50 mg   And   LORazepam  (ATIVAN ) injection 2 mg   haloperidol  lactate (HALDOL ) injection 10 mg   And   diphenhydrAMINE  (BENADRYL ) injection 50 mg   And   LORazepam  (ATIVAN ) injection 2 mg   magnesium  hydroxide (MILK OF MAGNESIA) suspension 30 mL   multivitamin with minerals tablet 1 tablet   acetaminophen  (TYLENOL ) tablet 650 mg   naltrexone  (DEPADE) tablet 50 mg   DULoxetine  (CYMBALTA ) DR capsule 60 mg   naphazoline-glycerin  (CLEAR EYES REDNESS) ophth solution 1-2 drop   gabapentin  (NEURONTIN ) capsule 400 mg   traZODone  (DESYREL ) tablet 100 mg   PTA Medications  Medication Sig   DULoxetine  (CYMBALTA ) 60 MG capsule Take 1 capsule (60 mg total) by mouth daily.   hydrOXYzine  (ATARAX ) 25 MG tablet Take 1 tablet (25 mg total) by mouth 3 (three) times daily as needed for anxiety.   nicotine  (NICODERM CQ  - DOSED IN MG/24 HOURS) 14 mg/24hr patch Place 1 patch (14 mg total) onto the skin daily at 6 (six) AM.   traZODone  (DESYREL ) 100 MG tablet Take 1 tablet (100 mg total) by mouth at bedtime as needed for sleep.   naltrexone  (DEPADE) 50 MG tablet Take 1 tablet (50 mg total) by mouth daily.   gabapentin  (NEURONTIN ) 400 MG capsule Take 1 capsule (400 mg total) by mouth 3 (three) times daily.   Multiple Vitamin (MULTIVITAMIN WITH MINERALS) TABS tablet Take 1 tablet by mouth daily.   naphazoline-glycerin  (CLEAR EYES REDNESS) 0.012-0.25 % SOLN Place 1-2 drops into both eyes 2 (two) times daily as needed for eye irritation.        No data to display          Flowsheet Row ED from 09/22/2023 in [facility] ED from 09/21/2023 in  [facility] ED from 09/13/2023 in [facility] Emergency Department at [facility]  C-SSRS RISK CATEGORY No Risk No Risk No Risk    Musculoskeletal  Strength & Muscle Tone: within normal limits Gait & Station: normal Patient leans: N/A  Psychiatric Specialty Exam  Presentation  General Appearance:  Appropriate for Environment  Eye Contact: Fair  Speech: Clear and Coherent  Speech Volume: Normal  Handedness:No data recorded  Mood and Affect  Mood: Euthymic  Affect: Appropriate  Thought Process  Thought Processes: Goal Directed  Descriptions of Associations:Intact  Orientation:Full (Time, Place and Person)  Thought Content:WDL  Diagnosis of Schizophrenia or Schizoaffective disorder in past: No    Hallucinations:Hallucinations: None  Ideas of Reference:None  Suicidal Thoughts:Suicidal Thoughts: No  Homicidal Thoughts:Homicidal Thoughts: No  Sensorium  Memory: Immediate Fair  Judgment: Fair  Insight: Fair  Art therapist  Concentration: Fair  Attention Span: Fair  Recall: Fair  Fund of Knowledge: Fair  Language: Fair  Psychomotor Activity  Psychomotor Activity:Psychomotor Activity: Normal  Assets  Assets: Manufacturing systems engineer; Desire for Improvement; Financial Resources/Insurance; Resilience; Physical Health; Housing; Social Support  Sleep  Sleep:Sleep: Fair  Estimated Sleeping Duration (Last 24 Hours): 7.50-9.25 hours  No data recorded  Physical Exam  Physical Exam ROS Blood pressure 98/75, pulse 82, temperature 98.2 F (36.8 C), temperature source Oral, resp. rate 18, SpO2 99%. There is no height or weight on file to calculate BMI.  Demographic Factors:  Male, Low socioeconomic status, Living alone, and Unemployed  Loss Factors: Legal issues and Financial problems/change in socioeconomic status  Historical Factors: Impulsivity  Risk Reduction Factors:   Positive social  support, Positive therapeutic relationship, and Positive coping skills or problem solving skills  Continued Clinical Symptoms:  Alcohol/Substance Abuse/Dependencies  Cognitive Features That Contribute To Risk:  None    Suicide Risk:  Minimal: No identifiable suicidal ideation.  Patients presenting with no risk factors but with morbid ruminations; may be classified as minimal risk based on the severity of the depressive symptoms  Plan Of Care/Follow-up recommendations:  Patient is going to [facility]  Disposition: Discharge home to sibling.  KANDI JAYSON HAHN, MD 09/27/2023, 10:47 AM

## 2023-09-27 NOTE — ED Notes (Signed)
 Patient had food in his room, chicken and barbeque sauce from yesterdays dinner, I informed him he is not Supposed to eat in his room, and that may not be fresh enough to eat from yesterday. He will have to go  To the dayroom with it. The patient came in for breakfast and threw it away.

## 2023-09-27 NOTE — Group Note (Signed)
 Group Topic: Overcoming Obstacles  Group Date: 09/27/2023 Start Time: 1930 End Time: 2000 Facilitators: Verdon Jacqualyn BRAVO, NT  Department: [facility]  Number of Participants: 9  Group Focus: feeling awareness/expression Treatment Modality:  Individual Therapy Interventions utilized were group exercise Purpose: trigger / craving management  Name: Jake Silva Date of Birth: June 13, 1980  MR: [id number]    Level of Participation: active Quality of Participation: cooperative Interactions with others: gave feedback Mood/Affect: appropriate Triggers (if applicable): hanging with the wrong people, drugs Cognition: coherent/clear Progress: Moderate Response: n/a Plan: follow-up needed  Patients Problems:  Patient Active Problem List   Diagnosis Date Noted   Alcohol use disorder, severe, dependence (HCC) 09/22/2023   Critical polytrauma 08/21/2020   Trauma 08/11/2020   Alcohol abuse 01/23/2014

## 2023-09-27 NOTE — ED Notes (Signed)
 Patient reported that he is negative for suicidal ideation. Complained of pain around pelvic areas, leg and hip.rating 5/10. Denied anxiety but reported sweating around midnight. Denied SI/HI, and AVH. No sleep disturbances reported. Nutrition remains as usual. No change to BM. Med compliant.

## 2023-09-27 NOTE — ED Notes (Signed)
 Patien is sleeping. No issue

## 2023-09-27 NOTE — ED Notes (Signed)
 3

## 2023-10-04 DIAGNOSIS — F419 Anxiety disorder, unspecified: Secondary | ICD-10-CM | POA: Diagnosis not present

## 2023-11-12 ENCOUNTER — Ambulatory Visit (HOSPITAL_COMMUNITY)
Admission: EM | Admit: 2023-11-12 | Discharge: 2023-11-12 | Disposition: A | Discharge status: Home / Self Care | Attending: Nurse Practitioner | Admitting: Nurse Practitioner

## 2023-11-12 DIAGNOSIS — F1911 Other psychoactive substance abuse, in remission: Secondary | ICD-10-CM | POA: Insufficient documentation

## 2023-11-12 DIAGNOSIS — F1910 Other psychoactive substance abuse, uncomplicated: Secondary | ICD-10-CM

## 2023-11-12 DIAGNOSIS — F17200 Nicotine dependence, unspecified, uncomplicated: Secondary | ICD-10-CM | POA: Insufficient documentation

## 2023-11-12 DIAGNOSIS — F1410 Cocaine abuse, uncomplicated: Secondary | ICD-10-CM | POA: Insufficient documentation

## 2023-11-12 DIAGNOSIS — F10129 Alcohol abuse with intoxication, unspecified: Secondary | ICD-10-CM | POA: Insufficient documentation

## 2023-11-12 DIAGNOSIS — F1210 Cannabis abuse, uncomplicated: Secondary | ICD-10-CM | POA: Insufficient documentation

## 2023-11-12 DIAGNOSIS — Z5900 Homelessness unspecified: Secondary | ICD-10-CM | POA: Insufficient documentation

## 2023-11-12 DIAGNOSIS — F411 Generalized anxiety disorder: Secondary | ICD-10-CM | POA: Insufficient documentation

## 2023-11-12 NOTE — Discharge Instructions (Signed)

## 2023-11-12 NOTE — Progress Notes (Signed)
   11/12/23 0409  [facility] Triage Screening (Walk-ins at [facility] only)  How Did You Hear About Us ? Self  What Is the Reason for Your Visit/Call Today? Jake Silva is a 43 year old male presenting as a voluntary walk-in to [facility] due to alcohol abuse. Patient has history of polysubstance abuse, cocaine, alcohol, MDD and GAD. Patient denied SI, HI, psychosis and alcohol/drug usage. Patient disheveled. Patient mumbling throughout entire assessment, speech is slurred. Patient appears to be under the influence of alcohol. Patient requesting detox from alcohol and cocaine.  How Long Has This Been Causing You Problems? <Week  Have You Recently Had Any Thoughts About Hurting Yourself? No  Are You Planning to Commit Suicide/Harm Yourself At This time? No  Have you Recently Had Thoughts About Hurting Someone Jake Silva? No  Are You Planning To Harm Someone At This Time? No  Explanation: n/a  Physical Abuse Denies  Verbal Abuse Denies  Sexual Abuse Denies  Exploitation of patient/patient's resources Denies  Self-Neglect Denies  Possible abuse reported to:  (n/a)  Are you currently experiencing any auditory, visual or other hallucinations? No  Have You Used Any Alcohol or Drugs in the Past 24 Hours? Yes  What Did You Use and How Much? alcohol, prior to arrival, unknown amounts  Do you have any current medical co-morbidities that require immediate attention? No  Clinician description of patient physical appearance/behavior: disheveled, bloody stained clothes/ intoxicated  What Do You Feel Would Help You the Most Today? Alcohol or Drug Use Treatment  If access to [facility] was not available, would you have sought care in the Emergency Department?  Jake Silva)  Determination of Need Routine (7 days)  Options For Referral Medication Management;Outpatient Therapy;Intensive Outpatient Therapy    Flowsheet Row ED from 11/12/2023 in [facility] ED from 09/22/2023 in [facility] ED from 09/21/2023 in [facility]  C-SSRS RISK CATEGORY No Risk No Risk No Risk

## 2023-11-12 NOTE — BH Assessment (Incomplete)
 Jake Silva is a 43 year old male presenting as a voluntary walk-in to [facility] due to alcohol abuse. Patient cv
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
